# Patient Record
Sex: Female | Born: 1935
Health system: Southern US, Community
[De-identification: ages and names within clinical notes are randomized; demographics above are authoritative.]

## PROBLEM LIST (undated history)

## (undated) DIAGNOSIS — G4700 Insomnia, unspecified: Secondary | ICD-10-CM

## (undated) DIAGNOSIS — F329 Major depressive disorder, single episode, unspecified: Secondary | ICD-10-CM

## (undated) DIAGNOSIS — F0390 Unspecified dementia without behavioral disturbance: Secondary | ICD-10-CM

## (undated) DIAGNOSIS — D696 Thrombocytopenia, unspecified: Secondary | ICD-10-CM

## (undated) DIAGNOSIS — N183 Chronic kidney disease, stage 3 unspecified: Secondary | ICD-10-CM

## (undated) DIAGNOSIS — I447 Left bundle-branch block, unspecified: Secondary | ICD-10-CM

## (undated) DIAGNOSIS — M1990 Unspecified osteoarthritis, unspecified site: Secondary | ICD-10-CM

## (undated) DIAGNOSIS — S72141A Displaced intertrochanteric fracture of right femur, initial encounter for closed fracture: Secondary | ICD-10-CM

## (undated) DIAGNOSIS — F32A Depression, unspecified: Secondary | ICD-10-CM

## (undated) DIAGNOSIS — I10 Essential (primary) hypertension: Secondary | ICD-10-CM

## (undated) HISTORY — DX: Unspecified dementia, unspecified severity, without behavioral disturbance, psychotic disturbance, mood disturbance, and anxiety: F03.90

## (undated) HISTORY — DX: Insomnia, unspecified: G47.00

## (undated) HISTORY — DX: Unspecified osteoarthritis, unspecified site: M19.90

## (undated) HISTORY — DX: Depression, unspecified: F32.A

## (undated) HISTORY — DX: Chronic kidney disease, stage 3 (moderate): N18.3

## (undated) HISTORY — DX: Chronic kidney disease, stage 3 unspecified: N18.30

## (undated) HISTORY — DX: Left bundle-branch block, unspecified: I44.7

## (undated) HISTORY — DX: Major depressive disorder, single episode, unspecified: F32.9

## (undated) HISTORY — DX: Essential (primary) hypertension: I10

---

## 2002-06-18 ENCOUNTER — Other Ambulatory Visit: Admission: RE | Admit: 2002-06-18 | Discharge: 2002-06-18 | Payer: Self-pay | Admitting: *Deleted

## 2002-06-21 ENCOUNTER — Encounter (INDEPENDENT_AMBULATORY_CARE_PROVIDER_SITE_OTHER): Payer: Self-pay | Admitting: Cardiology

## 2002-06-21 ENCOUNTER — Ambulatory Visit (HOSPITAL_COMMUNITY): Admission: RE | Admit: 2002-06-21 | Discharge: 2002-06-21 | Payer: Self-pay | Admitting: Cardiology

## 2002-07-20 ENCOUNTER — Ambulatory Visit (HOSPITAL_COMMUNITY): Admission: RE | Admit: 2002-07-20 | Discharge: 2002-07-20 | Payer: Self-pay | Admitting: *Deleted

## 2002-07-20 ENCOUNTER — Encounter (INDEPENDENT_AMBULATORY_CARE_PROVIDER_SITE_OTHER): Payer: Self-pay | Admitting: Specialist

## 2002-08-16 HISTORY — PX: VAGINAL HYSTERECTOMY: SHX2639

## 2002-08-16 HISTORY — PX: RECTOCELE REPAIR: SHX761

## 2002-09-06 ENCOUNTER — Encounter (INDEPENDENT_AMBULATORY_CARE_PROVIDER_SITE_OTHER): Payer: Self-pay | Admitting: Specialist

## 2002-09-06 ENCOUNTER — Inpatient Hospital Stay (HOSPITAL_COMMUNITY): Admission: RE | Admit: 2002-09-06 | Discharge: 2002-09-08 | Payer: Self-pay | Admitting: *Deleted

## 2006-07-15 ENCOUNTER — Inpatient Hospital Stay (HOSPITAL_COMMUNITY): Admission: EM | Admit: 2006-07-15 | Discharge: 2006-07-18 | Payer: Self-pay | Admitting: Emergency Medicine

## 2006-07-17 DIAGNOSIS — I447 Left bundle-branch block, unspecified: Secondary | ICD-10-CM

## 2006-07-17 HISTORY — PX: CARDIAC CATHETERIZATION: SHX172

## 2006-07-17 HISTORY — DX: Left bundle-branch block, unspecified: I44.7

## 2006-07-17 HISTORY — PX: CARDIOVASCULAR STRESS TEST: SHX262

## 2009-10-05 ENCOUNTER — Emergency Department (HOSPITAL_COMMUNITY): Admission: EM | Admit: 2009-10-05 | Discharge: 2009-10-05 | Payer: Self-pay | Admitting: Emergency Medicine
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
## 2010-07-03 NOTE — Discharge Summary (Signed)
   Laura Savage, Laura Savage                            ACCOUNT NO.:  [id number]   MEDICAL RECORD NO.:  [id number]                   PATIENT TYPE:  INP   LOCATION:  0446                                 FACILITY:  [facility]   PHYSICIAN:  Pershing Cox, M.D.            DATE OF BIRTH:  02/03/1930   DATE OF ADMISSION:  09/06/2002  DATE OF DISCHARGE:  09/07/2002                                 DISCHARGE SUMMARY   ADMISSION DIAGNOSES:  1. Genital prolapse.  2. Stress urinary incontinence.   HOSPITAL COURSE:  For record of this patient's admission history and  physical, please see the note dated September 06, 2002.  Briefly, she is 77 years  old.  She had significant vaginal prolapse with a third degree cystocele and  cervix extending to her introitus.  She was seen by Lynelle Smoke I. Patsi Sears,  M.D. prior to surgery and because of her stress incontinence a decision was  made to perform a sling in conjunction with her scheduled surgery for  genital prolapse.   On the day of admission the patient was taken to the operating room and  under general anesthesia vaginal hysterectomy and rectocele repair,  perineorrhaphy were performed without complications.  She was also operated  on by co-surgeon Sigmund I. Patsi Sears, M.D. who placed an obturator sling.   On the evening of the day of surgery the patient had some nausea but it was  well controlled.  She had no problems with pain.  Vaginal packing was  removed.  On the morning of postoperative day #1 she received Toradol.  Foley catheter was removed.  Hemoglobin was stable at 10.2.  Later in the  day after being seen by urology she was able to be discharged with plans to  be seen in my office and by Sigmund I. Patsi Sears, M.D. regarding her sling  procedure.                                               Pershing Cox, M.D.    MAJ/MEDQ  D:  09/24/2002  T:  09/24/2002  Job:  [id number]   cc:   Lynelle Smoke I. Patsi Sears, M.D.  [address]  [place]  [place] [address]  Fax: [phone]   Tammy R. Collins Scotland, M.D.  [address]  [place]  [place] [address]  Fax: [phone]

## 2010-07-03 NOTE — Op Note (Signed)
NAMEJAQUAYA, COYLE                            ACCOUNT NO.:  [id number]   MEDICAL RECORD NO.:  [id number]                   PATIENT TYPE:  INP   LOCATION:  0002                                 FACILITY:  [facility]   PHYSICIAN:  Pershing Cox, M.D.            DATE OF BIRTH:  1930/02/11   DATE OF PROCEDURE:  09/06/2002  DATE OF DISCHARGE:                                 OPERATIVE REPORT   PREOPERATIVE DIAGNOSIS:  Genital prolapse.   POSTOPERATIVE DIAGNOSIS:  Genital prolapse.   ANESTHESIA:  General endotracheal.   SURGEON:  Pershing Cox, M.D.   Mammie LorenzoLynelle Smoke I. Patsi Sears, M.D.   ASSISTANT SURGEON:  Genia Del, M.D.   INDICATIONS FOR PROCEDURE:  The patient is 77 years old.  She has a complete  genital prolapse and presented with postmenopausal bleeding.  She  subsequently had a D&C hysteroscopy showing a single polyp with simple  hyperplasia.  She had a very large residual urine at the time of that  procedure and was seen by Sigmund I. Patsi Sears, M.D. prior to surgery, who  is co-surgeon for this case to place a vaginal sling and cystocele repair.  The patient is admitted today for a vaginal hysterectomy, anterior and  posterior repair with the urethral sling.   FINDINGS:  The patient's cervix was at her introitus and at rest and past  her introitus with traction.  There was a third-degree cystocele, first-  degree rectocele.  Both ovaries were atrophic.   DESCRIPTION OF PROCEDURE:  Jillianna Stanek was brought to the operating room with  an IV in place.  She had received 1 g of Ancef in the holding area, and  thigh-high PAS stockings were placed on her extremities.  Supine on the OR  table, general endotracheal anesthesia was induced without difficulty.  She  was then placed into Allen stirrups, and the anterior abdominal wall,  perineum, vagina and upper thighs were prepped with a solution of Hibiclens.  She was then draped for a sterile vaginal procedure.   Foley catheter was  sterilely inserted into the bladder, and a specimen was taken for  urinalysis.  A drape was placed across the anus, and a weighted vaginal  speculum was introduced into the vagina.   Christella Hartigan tenaculums were placed on the anterior and posterior cervix for  orientation.  Marcaine 0.5% with epinephrine was injected into the  pericervical tissues which were then cut with cutting cautery using the  Bovie.  The anterior vaginal wall was also infiltrated with the solution of  0.5% Marcaine with epinephrine.  Mayo scissors were used to separate the  vaginal mucosa from the _____ to cervix.  At the completion of the  procedure, we ascertained that the cervix was approximately 10 inches long.  The uterus itself was about eight weeks in size.  The majority of the case  was spent trying to enter the anterior  and posterior cul-de-sac.  Because of  the extremely elongated cervix, it was very difficult.  I was able to begin  the case by isolating the uterosacral ligament.  Once the uterus was  completely visualized and the posterior cul-de-sac was opened, I was able to  then take the uterosacral ligaments at their deep insertion which was high  in the cul-de-sac.   Entering the cul-de-sac of Douglas, the uterosacral ligaments were  visualized, clamped, cut, sutured with 0 Vicryls and held for later in the  case.  The posterior peritoneum was identified and sutured to the posterior  cuff over the top of the irregular tissue which had been abraded during our  dissection getting into the cul-de-sac of Douglas.  We were able to enter  the anterior cul-de-sac without difficulty and using the LigaSure, the  anterior and posterior peritoneums were opposed going up the sidewall.  The  fundus of the uterus was everted and inadvertently severed from its  attachment to the utero-ovarian ligament.  I was able to secure the uterus  on the patient's right, removing the uterus completely.  The  ovary on this  side was visualized and was small.  There was a bleeding pedicle coming from  the left ovary.  The ovary was lifted, and the pedicle was easily seen,  clamped, and suture ligated.  Using the uterosacral ligaments as a guide,  McCall sutures were used to obliterate the cul-de-sac to try to prevent  enterocele development.  These were affixed to the uterosacral ligament.  The vagina was then closed side-to-side using interrupted 0 Vicryl sutures.  Uterosacral ligaments were secured to one another underneath this closure.   At this point, Sigmund I. Patsi Sears, M.D. came into the operating room to  complete his procedure which he will describe in a separate operative note.   At the end of the procedure, I performed a perineorrhaphy.  Marcaine with  lidocaine was injected into the perineum and along the posterior vaginal  wall.  Rectocele repair was performed by incising the vaginal mucosa.  Using  T-clamps, we separated from the underlying rectosigmoid.  Vicryl mesh was  folded in fourths and placed over the rectosigmoid which had been dissected  successfully from the vaginal mucosa.  This was secured over the top of the  rectal tissue, and perivaginal tissues were closed over the fascial implant.  The vaginal walls were excised, and then the vaginal mucosa was closed over  the underlying layered tissue.  The 0 Vicryl suture was used to bring the  perineum together using three interrupted sutures.  The skin was then  brought together using a running 2-0 Vicryl stitch with a subcuticular  closing the skin.  Packing was placed into the vagina with Estrace cream.   ESTIMATED BLOOD LOSS:  400 mL.   URINE OUTPUT:  650 mL.   FLUIDS:  3200 mL.   SPECIMENS:  Uterus morcellated.                                               Pershing Cox, M.D.    MAJ/MEDQ  D:  09/06/2002  T:  09/06/2002  Job:  [id number]  cc:   Lynelle Smoke I. Patsi Sears, M.D.  [address]   [place]  [place] [address]  Fax: [phone]   Genia Del, M.D.  [address]  [place]  [place] [address]  Fax: [phone]   Tammy R. Collins Scotland, M.D.  [address]  [place]  [place] [address]  Fax: [phone]

## 2010-07-03 NOTE — Op Note (Signed)
NAMEGUY, TONEY                            ACCOUNT NO.:  [id number]   MEDICAL RECORD NO.:  [id number]                   PATIENT TYPE:  INP   LOCATION:  0002                                 FACILITY:  [facility]   PHYSICIAN:  Sigmund I. Patsi Sears, M.D.         DATE OF BIRTH:  April 10, 1929   DATE OF PROCEDURE:  09/06/2002  DATE OF DISCHARGE:                                 OPERATIVE REPORT   PREOPERATIVE DIAGNOSIS:  Pelvic prolapse with stress urinary incontinence.   POSTOPERATIVE DIAGNOSIS:  Pelvic prolapse with stress urinary incontinence.   OPERATION:  Mentor OB sling with Tutoplast reinforcement, anterior vaginal  vault repair with Vicryl mesh and tutoplasty reinforcement.   SURGEON:  Sigmund I. Patsi Sears, M.D. and Pershing Cox, M.D.   PREPARATION:  After appropriate preanesthesia, the patient was brought to  the operating room, placed on the operating table in dorsal supine position  where general endotracheal anesthesia was introduced. She then was placed in  the dorsal lithotomy position, and underwent vaginal hysterectomy per Dr.  Carey Bullocks.   Following hysterectomy, the Foley was exchanged, and the pelvic prolapse was  evaluated. The patient has a very large cystourethrocele with a grade 4  prolapse. Xylocaine with epinephrine 1:200,000 was injected and a 2 cm  incision was made over the urethra and subcutaneous tissue, dissected  bilaterally. The obturator fossa was marked 5 cm from the clitoris and two  stab wounds were made. The Mentor obturator sling was then placed in  retrograde fashion, with care taken to avoid any tightness at the midline.  Cystoscopy revealed no evidence of sling within the bladder. The bladder was  drained of fluid, and the ends of the tape were cut subcutaneously. A 2 cm  portion of Tutoplast fascia was then placed over the sling material and the  wound closed in two layers with interrupted and running 3-0 Vicryl suture.   A midline  incision was then made approximately 3-4 mm away from the urethral  incision, more proximalward, subcutaneous tissue dissected bilaterally. The  large cystocele was reduced, and a 4 cm x 4 cm square of Vicryl mesh was  used to hold the bladder in place. Anterior vaginal vault repair was then  accomplished over the top of that, and Tutoplast fascia was then used to  reinforce the repair. The devascularized edge of vaginal vault is removed,  and the wound is closed with running 3-0 Vicryl suture. The patient then  underwent rectocele repair per Dr. Carey Bullocks and this will be dictated  separately.                                               Sigmund I. Patsi Sears, M.D.   SIT/MEDQ  D:  09/06/2002  T:  09/06/2002  Job:  [id number]  cc:   Pershing Cox, M.D.  [address]  [place]  [place] [address]  Fax: [phone]

## 2010-07-03 NOTE — Op Note (Signed)
NAMEJENESIS, Laura Savage                            ACCOUNT NO.:  [id number]   MEDICAL RECORD NO.:  [id number]                   PATIENT TYPE:  AMB   LOCATION:  DAY                                  FACILITY:  [facility]   PHYSICIAN:  Pershing Cox, M.D.            DATE OF BIRTH:  02-Nov-1929   DATE OF PROCEDURE:  07/20/2002  DATE OF DISCHARGE:                                 OPERATIVE REPORT   PREOPERATIVE DIAGNOSES:  1. Postmenopausal bleeding.  2. Endometrial biopsy showing evidence of endometrial polyp.  3. Third-degree cystocele.  4. Genital prolapse.   POSTOPERATIVE DIAGNOSES:  1. Endometrial polyps.  2. Third-degree cystocele.  3. Genital prolapse.   PROCEDURE:  1. Exam under anesthesia.  2. Fractional D&C hysteroscopy.  3. Resection of endometrial polyps.  4. In and out catheterization for urine C&S.   ANESTHESIA:  MAC plus Marcaine paracervical block.   SURGEON:  Pershing Cox, M.D.   INDICATIONS FOR PROCEDURE:  Laura Savage is 77 years old.  She has a recent  diagnosis of postmenopausal bleeding and presented because of a large bulge  in her vagina.  Endometrial biopsy was performed which showed benign simple  hyperplasia and a polyp fragment.  The patient is brought to the operating  room today to rule out endometrial carcinoma in preparation for a planned  vaginal hysterectomy and anterior repair.   OPERATIVE FINDINGS:  There was approximately 500 mL of fluid in the  patient's bladder which was clear, suggesting some sort of relative  obstruction or urinary retention.  The patient's endometrial cavity sounded  to 11 cm, suggesting cervical elongation in the face of a normal uterine  fundus.  There were polyps arising from the upper fundus.  Otherwise the  endometrial cavity was atrophic.   DESCRIPTION OF PROCEDURE:  Laura Savage was brought to the operating room with  an IV in place.  She was placed supine on the OR table, and IV sedation was  administered.   She was then placed into Allen stirrups.  The urethra was  prepped with Hibiclens, and an in and out catheterization was performed,  gathering urine for UA and C&S.  There was approximately 500 mL of clear  urine drained.  She had only received 100 mL of IV fluid at this time.   Using Hibiclens, the anterior abdominal wall, perineum, vagina, and upper  thighs were cleansed.  The patient was then draped for a sterile vaginal  procedure.  It was unnecessary to use a speculum for this procedure because  there was such a high degree of prolapse.  Tenaculum was placed onto the  anterior cervix, then a Marcaine paracervical block was administered at the  3, 4, 7, and 8 positions using total volume of 0.25% Marcaine 15 mL.  Kevorkian curette was used to obtain endocervical curettings.  Uterine sound  then passed to 11 cm.  Serial Shawnie Pons  dilators were used to dilate the cervix  to admit a diagnostic scope.  The scope was introduced, and the polyps were  visualized, and a decision was made to proceed with resection using a right-  angle wire.  The cervix was then further dilated to admit a 33 dilator, and  the resectoscope was introduced.  Using through-and-through sorbitol  irrigation, the cavity was visualized.  Using the right-angle wire, the  polyps were removed from the uterine fundus in two passes.  These were  gathered onto a specimen container.  Next, endometrial curettings were  obtained using a small sharp curette, using a circumferential curettage.  These fragments were very scant.   Specimens include endocervical curettings, endometrial polyps, and  endometrial curettings.  The sound passed again to a depth of 11 cm.   There were no intraoperative complications.  The patient tolerated the  procedure well and was taken to the recovery room in excellent condition.                                               Pershing Cox, M.D.    MAJ/MEDQ  D:  07/20/2002  T:  07/20/2002  Job:   [id number]   cc:   Tammy R. Collins Scotland, M.D.  [address]  [place]  [place] [address]  Fax: [phone]

## 2010-09-25 ENCOUNTER — Other Ambulatory Visit: Payer: Self-pay | Admitting: Neurology

## 2010-09-25 DIAGNOSIS — R269 Unspecified abnormalities of gait and mobility: Secondary | ICD-10-CM

## 2010-09-25 DIAGNOSIS — E785 Hyperlipidemia, unspecified: Secondary | ICD-10-CM

## 2010-09-25 DIAGNOSIS — F068 Other specified mental disorders due to known physiological condition: Secondary | ICD-10-CM

## 2010-09-25 DIAGNOSIS — I10 Essential (primary) hypertension: Secondary | ICD-10-CM

## 2010-09-29 ENCOUNTER — Ambulatory Visit
Admission: RE | Admit: 2010-09-29 | Discharge: 2010-09-29 | Disposition: A | Discharge status: Home / Self Care | Payer: Self-pay | Source: Ambulatory Visit | Attending: Neurology | Admitting: Neurology

## 2010-09-29 DIAGNOSIS — I10 Essential (primary) hypertension: Secondary | ICD-10-CM

## 2010-09-29 DIAGNOSIS — R269 Unspecified abnormalities of gait and mobility: Secondary | ICD-10-CM

## 2010-09-29 DIAGNOSIS — E785 Hyperlipidemia, unspecified: Secondary | ICD-10-CM

## 2010-09-29 DIAGNOSIS — F068 Other specified mental disorders due to known physiological condition: Secondary | ICD-10-CM

## 2010-12-03 LAB — BASIC METABOLIC PANEL
BUN: 21
Calcium: 9.4
Creatinine, Ser: 1.13
GFR calc non Af Amer: 47 — ABNORMAL LOW

## 2011-12-10 ENCOUNTER — Other Ambulatory Visit: Payer: Self-pay | Admitting: Nephrology

## 2011-12-15 ENCOUNTER — Other Ambulatory Visit: Payer: Medicare HMO

## 2011-12-22 ENCOUNTER — Ambulatory Visit
Admission: RE | Admit: 2011-12-22 | Discharge: 2011-12-22 | Disposition: A | Discharge status: Home / Self Care | Payer: Medicare HMO | Source: Ambulatory Visit | Attending: Nephrology | Admitting: Nephrology

## 2012-07-06 ENCOUNTER — Encounter: Payer: Self-pay | Admitting: Family Medicine

## 2012-07-06 ENCOUNTER — Ambulatory Visit (INDEPENDENT_AMBULATORY_CARE_PROVIDER_SITE_OTHER): Payer: Medicare HMO | Admitting: Family Medicine

## 2012-07-06 VITALS — BP 121/79 | HR 68 | Temp 98.7°F | Resp 16 | Ht 63.0 in | Wt 170.5 lb

## 2012-07-06 DIAGNOSIS — F981 Encopresis not due to a substance or known physiological condition: Secondary | ICD-10-CM

## 2012-07-06 DIAGNOSIS — K219 Gastro-esophageal reflux disease without esophagitis: Secondary | ICD-10-CM

## 2012-07-06 DIAGNOSIS — R1310 Dysphagia, unspecified: Secondary | ICD-10-CM | POA: Insufficient documentation

## 2012-07-06 DIAGNOSIS — R159 Full incontinence of feces: Secondary | ICD-10-CM

## 2012-07-06 MED ORDER — OMEPRAZOLE 40 MG PO CPDR: 40.0000 mg | DELAYED_RELEASE_CAPSULE | Freq: Every day | ORAL | Status: DC

## 2012-07-06 NOTE — Assessment & Plan Note (Signed)
Recommended stopping all "gas pills". Eat smaller meals. I think most of her problem is her inability to physically get to the bathroom in order to use the toilet (more than actual severe fecal urgency).

## 2012-07-06 NOTE — Progress Notes (Signed)
OFFICE NOTE  07/06/2012  CC:  Chief Complaint  Patient presents with  . Bowel Incontinence    Pt c/o bowel incontinence episodes.     HPI: Patient is a 79 y.o. Caucasian female who is a new pt today here for an acute visit with her husband and her daughter in law. She formerly saw Dr. Yehuda Budd in [place]. Her complaint today is fecal incontinence.  Unsure how long it has been going on (pt does have dementia and has only been with the daughter in law as caregiver in the last 1 wk), but pt estimates 59mo. Has had about 10 total episodes of losing some or all stool.  Says she will get a feeling of urgency to have a BM and then can't make it to the bathroom in time.  Onset was not coincidental with starting any particular med. Normal stool pattern is soft bm once daily.   Most of the cases of fecal incont have occurred shortly after eating a very large meal.  She denies any specific type of food being a problem particularly.  Takes lots of "gas pills" for epigastric fullness/burning discomfort. She does have some burning in central, upper sternal area, usually after eating.  Has recent history of persistent feeling of difficulty swallowing food and pills.  Unclear how long this part has been occuring.    Pertinent PMH:  Past Medical History  Diagnosis Date  . Hypertension   . Dementia   . Depression   . Osteoarthritis   . Chronic renal insufficiency, stage III (moderate)     2013 renal u/s: medical renal dz  . Insomnia   . LBBB (left bundle branch block) 07/2006   PSH: Hysterectomy  SOC: Lives with husband and daughter in law.  No T/A/Ds.   MEDS:  NONE  PE: Blood pressure 121/79, pulse 68, temperature 98.7 F (37.1 C), temperature source Oral, resp. rate 16, height 5\' 3"  (1.6 m), weight 170 lb 8 oz (77.338 kg), SpO2 94.00%. Gen: Alert, well appearing.  Patient is oriented to person, place, time, and situation.  Moves very slowly and weakly climbs onto exam table with slight  assistance in balance. ENT: Eyes: no injection, icteris, swelling, or exudate.  EOMI, PERRLA. Nose: no drainage or turbinate edema/swelling.  No injection or focal lesion.  Mouth: lips without lesion/swelling.  Oral mucosa pink and moist.  Oropharynx without erythema, exudate, or swelling.  CV: RRR, no m/r/g.   LUNGS: CTA bilat, nonlabored resps, good aeration in all lung fields. ABD: soft, nondistended.  Mild diffuse TTP w/out guarding or rebound.  No mass or HSM or bruit.   IMPRESSION AND PLAN:  New pt; limited/acute visit.  Fecal incontinence not due to organic disease Recommended stopping all "gas pills". Eat smaller meals. I think most of her problem is her inability to physically get to the bathroom in order to use the toilet (more than actual severe fecal urgency).  Dysphagia Suspect GERD/esophageal stenosis-related. Anticipate referral to GI in the near future but not ordered today---will see how she does on PPI qd and bring her back for full visit to gather more information and will obtain old records from previous PCP in the meantime.  GERD (gastroesophageal reflux disease) Dyspepsia as well. Start prilosec 40mg  qAM. Discussed GERD diet and some change in habits to avoid excessive GER (she often eats a large meal and then immediately goes to lie down and go to bed).   An After Visit Summary was printed and given to the  patient.  FOLLOW UP: 3 wks for her official "establish care" visit and to f/u the problems above.

## 2012-07-06 NOTE — Assessment & Plan Note (Signed)
Dyspepsia as well. Start prilosec 40mg  qAM. Discussed GERD diet and some change in habits to avoid excessive GER (she often eats a large meal and then immediately goes to lie down and go to bed).

## 2012-07-06 NOTE — Assessment & Plan Note (Signed)
Suspect GERD/esophageal stenosis-related. Anticipate referral to GI in the near future but not ordered today---will see how she does on PPI qd and bring her back for full visit to gather more information and will obtain old records from previous PCP in the meantime.

## 2012-07-06 NOTE — Patient Instructions (Signed)
Eat smaller meals.  Do not eat within 2 hours of lying down to go to sleep. Review your diet for heartburn and reflux (handout). Stop all of your "gas pills".

## 2012-07-27 ENCOUNTER — Encounter: Payer: Self-pay | Admitting: Family Medicine

## 2012-07-27 ENCOUNTER — Ambulatory Visit (INDEPENDENT_AMBULATORY_CARE_PROVIDER_SITE_OTHER): Payer: Medicare HMO | Admitting: Family Medicine

## 2012-07-27 VITALS — BP 130/78 | HR 67 | Temp 97.4°F | Resp 14 | Wt 180.2 lb

## 2012-07-27 DIAGNOSIS — R2689 Other abnormalities of gait and mobility: Secondary | ICD-10-CM

## 2012-07-27 DIAGNOSIS — M545 Low back pain, unspecified: Secondary | ICD-10-CM

## 2012-07-27 DIAGNOSIS — Z9181 History of falling: Secondary | ICD-10-CM

## 2012-07-27 DIAGNOSIS — R296 Repeated falls: Secondary | ICD-10-CM

## 2012-07-27 DIAGNOSIS — N189 Chronic kidney disease, unspecified: Secondary | ICD-10-CM

## 2012-07-27 DIAGNOSIS — M25569 Pain in unspecified knee: Secondary | ICD-10-CM

## 2012-07-27 DIAGNOSIS — R29818 Other symptoms and signs involving the nervous system: Secondary | ICD-10-CM

## 2012-07-27 DIAGNOSIS — I10 Essential (primary) hypertension: Secondary | ICD-10-CM

## 2012-07-27 LAB — BASIC METABOLIC PANEL
Calcium: 10.5 mg/dL (ref 8.4–10.5)
GFR: 34.36 mL/min — ABNORMAL LOW (ref >60.00)
Sodium: 140 mEq/L (ref 135–145)

## 2012-07-27 NOTE — Progress Notes (Signed)
OFFICE NOTE  07/27/2012  CC:  Chief Complaint  Patient presents with  . Follow-up    3-wk [Fecal Incontinence; GERD/Dysphagia]     HPI: Patient is a 79 y.o. Caucasian female who is here with her daughter in law Merle for 3 wk f/u for fecal incontinence, GERD, dysphagia.   Says GERD and dysphagia are about the same on daily PPI.  Seems to be doing well, though, eating Merle's home cooking and has gained 10 lbs but denies overeating. Discussed memory problems most of today's visit--short term memory issues started around 2002-- getting worse gradually.  She is unsure how long she has been on both aricept and namenda, and we have no old records yet.  Since 01/2012 Christeen Douglas says she has gotten even worse.  Her husband is her HC POA.  She does not have a living will.  Merle says the pt's family has "turned on her" recently regarding some of her assets, etc--pt was very upset by this.   She has been seen by [facility] in the past, mri done--need records.  No change in her hx of fecal incontinence episodes: occurs only occasionally.  Compliant with all meds.  No home bp monitoring is done.  Pertinent PMH:  Past Medical History  Diagnosis Date  . Hypertension   . Dementia   . Depression   . Osteoarthritis   . Chronic renal insufficiency, stage III (moderate)     2013 renal u/s: medical renal dz  . Insomnia   . LBBB (left bundle branch block) 07/2006    MEDS:  Outpatient Prescriptions Prior to Visit  Medication Sig Dispense Refill  . ALPRAZolam (XANAX) 1 MG tablet Take 1 mg by mouth at bedtime as needed for sleep.      Marland Kitchen aspirin 81 MG tablet Take 81 mg by mouth daily.      Marland Kitchen donepezil (ARICEPT) 10 MG tablet Take 10 mg by mouth daily. Memory.      Marland Kitchen losartan-hydrochlorothiazide (HYZAAR) 50-12.5 MG per tablet Take 1 tablet by mouth daily.      . memantine (NAMENDA) 5 MG tablet Take 5 mg by mouth 2 (two) times daily.      . Multiple Vitamins-Minerals (MULTIVITAMIN & MINERAL PO)  Take 1 tablet by mouth daily. [facility].      . Omega-3 Fatty Acids (OMEGA-3 FISH OIL) 1200 MG CAPS Take 1 each by mouth daily. Nature Made + 360 mg OMEGA 3      . omeprazole (PRILOSEC) 40 MG capsule Take 1 capsule (40 mg total) by mouth daily.  30 capsule  3  . sertraline (ZOLOFT) 100 MG tablet Take 100 mg by mouth daily.      . vitamin B-12 (CYANOCOBALAMIN) 1000 MCG tablet Take 1,000 mcg by mouth daily. Nature Made.      . meloxicam (MOBIC) 7.5 MG tablet Take 7.5 mg by mouth 2 (two) times daily as needed for pain.       No facility-administered medications prior to visit.  Not taking mobic as listed above  PE: Blood pressure 130/78, pulse 67, temperature 97.4 F (36.3 C), temperature source Oral, resp. rate 14, weight 180 lb 4 oz (81.761 kg), SpO2 96.00%. GEN: alert, oriented to person and place and situation. AFFECT: pleasant, frequently laughed at appropriate times. CV: RRR, no m/r/g.   LUNGS: CTA bilat, nonlabored resps, good aeration in all lung fields. MMSE today: 15/30   IMPRESSION AND PLAN:  1) HTN, stable.  OK to continue ASA 81mg  qd. Check  lytes/cr.  2) CRI, stage III.  BMET today.    3) Dementia, suspect alzheimer's type: progressing as expected despite use of aricept and namenda.  We'll d/c namenda today and after one week we'll have her d/c aricept. We'll see if there is any change in her MS/cognitive function with d/c of these meds. Discussed things with her husband today to try to get him to fully understand what is going on with her and what to expect over time.    4) Generalized back and knees stiffness, mild arthralgias in these regions--some balance issues and frequent falls. Will arrange for PT for strengthening and help with balance.  Will put in another request for old records with former PMD, also will request records from [facility].  FOLLOW UP: 1 mo

## 2012-07-27 NOTE — Patient Instructions (Signed)
Stop Namenda today. In one week you may stop aricept.

## 2012-08-08 ENCOUNTER — Other Ambulatory Visit: Payer: Self-pay | Admitting: Family Medicine

## 2012-08-08 MED ORDER — OMEPRAZOLE 40 MG PO CPDR: 40.0000 mg | DELAYED_RELEASE_CAPSULE | Freq: Every day | ORAL | Status: AC

## 2012-08-08 MED ORDER — SERTRALINE HCL 100 MG PO TABS: 100.0000 mg | ORAL_TABLET | Freq: Every day | ORAL | Status: DC

## 2012-08-08 NOTE — Telephone Encounter (Signed)
[facility]

## 2012-09-04 ENCOUNTER — Encounter: Payer: Self-pay | Admitting: Family Medicine

## 2012-09-04 ENCOUNTER — Ambulatory Visit (INDEPENDENT_AMBULATORY_CARE_PROVIDER_SITE_OTHER): Payer: Medicare HMO | Admitting: Family Medicine

## 2012-09-04 VITALS — BP 151/71 | HR 71 | Temp 98.6°F | Resp 18 | Ht 63.0 in | Wt 173.0 lb

## 2012-09-04 DIAGNOSIS — F0390 Unspecified dementia without behavioral disturbance: Secondary | ICD-10-CM

## 2012-09-04 LAB — BASIC METABOLIC PANEL
Chloride: 102 mEq/L (ref 96–112)
Creatinine, Ser: 1.6 mg/dL — ABNORMAL HIGH (ref 0.4–1.2)
Potassium: 3.2 mEq/L — ABNORMAL LOW (ref 3.5–5.1)
Sodium: 138 mEq/L (ref 135–145)

## 2012-09-04 NOTE — Progress Notes (Signed)
OFFICE NOTE  09/04/2012  CC:  Chief Complaint  Patient presents with  . Follow-up     HPI: Patient is a 79 y.o. Caucasian female who is here for 6 wk f/u dementia.  Daugher in law with her today, says she is getting worse regarding her dementia.  Her husband fell recently and was on floor for several hours and pt was apparently walking around the house unaware of anything wrong.  No combative behavior.   Daughter asking for help---HH nursing?  Pt and her husband refuse to go to ALF or NH.  Pertinent PMH:  Past Medical History  Diagnosis Date  . Hypertension   . Dementia   . Depression   . Osteoarthritis   . Chronic renal insufficiency, stage III (moderate)     2013 renal u/s: medical renal dz  . Insomnia   . LBBB (left bundle branch block) 07/2006   Past surgical, social, and family history reviewed and no changes noted since last office visit.  MEDS:  Outpatient Prescriptions Prior to Visit  Medication Sig Dispense Refill  . ALPRAZolam (XANAX) 1 MG tablet Take 1 mg by mouth at bedtime as needed for sleep.      Marland Kitchen aspirin 81 MG tablet Take 81 mg by mouth daily.      Marland Kitchen losartan-hydrochlorothiazide (HYZAAR) 50-12.5 MG per tablet Take 1 tablet by mouth daily.      . meloxicam (MOBIC) 7.5 MG tablet Take 7.5 mg by mouth 2 (two) times daily as needed for pain.      . Multiple Vitamins-Minerals (MULTIVITAMIN & MINERAL PO) Take 1 tablet by mouth daily. [facility].      . Omega-3 Fatty Acids (OMEGA-3 FISH OIL) 1200 MG CAPS Take 1 each by mouth daily. Nature Made + 360 mg OMEGA 3      . omeprazole (PRILOSEC) 40 MG capsule Take 1 capsule (40 mg total) by mouth daily.  90 capsule  1  . sertraline (ZOLOFT) 100 MG tablet Take 1 tablet (100 mg total) by mouth daily.  90 tablet  0  . vitamin B-12 (CYANOCOBALAMIN) 1000 MCG tablet Take 1,000 mcg by mouth daily. Nature Made.       No facility-administered medications prior to visit.    PE: Blood pressure 151/71, pulse 71, temperature  98.6 F (37 C), temperature source Temporal, resp. rate 18, height 5\' 3"  (1.6 m), weight 173 lb (78.472 kg), SpO2 97.00%. Gen: alert, NAD.  Oriented to person, smiles and converses and makes good eye contact.  Turns to daughter, Christeen Douglas, and looks for her to answer every time she is asked a question.  Giggles frequently. CV: RRR, no m/r/g.   LUNGS: CTA bilat, nonlabored resps, good aeration in all lung fields.   IMPRESSION AND PLAN:  Dementia This is impairing her ability to take care of herself. I recommended she be cared for in an ALF or NH environment since her husband is unable to adequately care for/supervise her, and her daughter has reached the limit of stress level in trying to care for her. Pt's husband will not allow this currently, however, and we settled for a HH nursing referral/evaluation---ordered today.   Chronic renal insufficiency, stage III (moderate) Recheck BMET to check stability of cr, also K.   HTN: stable.  Will err a bit on the high side of normal at this time.  FOLLOW UP: 1 mo f/u dementia and HTN

## 2012-09-05 ENCOUNTER — Telehealth: Payer: Self-pay | Admitting: Family Medicine

## 2012-09-05 MED ORDER — POTASSIUM CHLORIDE CRYS ER 20 MEQ PO TBCR: 20.0000 meq | EXTENDED_RELEASE_TABLET | Freq: Every day | ORAL | Status: DC

## 2012-09-05 NOTE — Telephone Encounter (Signed)
Laura Savage wondering how Laura Savage did when you asked her questions yesterday at her visit.  She was just wondering how bad her dementia is in your eyes.  Please advise.

## 2012-09-05 NOTE — Telephone Encounter (Signed)
I'd say her dementia is at least moderate in severity.-thx

## 2012-09-06 NOTE — Telephone Encounter (Signed)
Patients care giver said that Patient wandered last night and the fire dept found her down the street.  [name] wants to know what her options are for help.  Please advise.

## 2012-09-06 NOTE — Telephone Encounter (Signed)
Left detailed message for Laura Savage letting her know provider advice.

## 2012-09-06 NOTE — Telephone Encounter (Signed)
Unfortunately, her options are the same as yesterday----we have ordered a home health referral for evaluation but ultimately the best solution is a nursing home or assisted living facility for both of them.-thx

## 2012-09-07 ENCOUNTER — Encounter: Payer: Self-pay | Admitting: Family Medicine

## 2012-09-07 ENCOUNTER — Ambulatory Visit (HOSPITAL_BASED_OUTPATIENT_CLINIC_OR_DEPARTMENT_OTHER)
Admission: RE | Admit: 2012-09-07 | Discharge: 2012-09-07 | Disposition: A | Discharge status: Home / Self Care | Payer: Medicare HMO | Source: Ambulatory Visit | Attending: Family Medicine | Admitting: Family Medicine

## 2012-09-07 ENCOUNTER — Ambulatory Visit (INDEPENDENT_AMBULATORY_CARE_PROVIDER_SITE_OTHER): Payer: Medicare HMO | Admitting: Family Medicine

## 2012-09-07 VITALS — BP 153/83 | HR 78 | Temp 97.9°F | Resp 16 | Ht 63.0 in | Wt 174.0 lb

## 2012-09-07 DIAGNOSIS — M545 Low back pain, unspecified: Secondary | ICD-10-CM | POA: Insufficient documentation

## 2012-09-07 DIAGNOSIS — Q762 Congenital spondylolisthesis: Secondary | ICD-10-CM | POA: Insufficient documentation

## 2012-09-07 DIAGNOSIS — W19XXXA Unspecified fall, initial encounter: Secondary | ICD-10-CM | POA: Insufficient documentation

## 2012-09-07 DIAGNOSIS — S20229A Contusion of unspecified back wall of thorax, initial encounter: Secondary | ICD-10-CM

## 2012-09-07 DIAGNOSIS — F0391 Unspecified dementia with behavioral disturbance: Secondary | ICD-10-CM | POA: Insufficient documentation

## 2012-09-07 DIAGNOSIS — M25859 Other specified joint disorders, unspecified hip: Secondary | ICD-10-CM | POA: Insufficient documentation

## 2012-09-07 DIAGNOSIS — M25559 Pain in unspecified hip: Secondary | ICD-10-CM

## 2012-09-07 DIAGNOSIS — S20221A Contusion of right back wall of thorax, initial encounter: Secondary | ICD-10-CM

## 2012-09-07 DIAGNOSIS — M25551 Pain in right hip: Secondary | ICD-10-CM

## 2012-09-07 NOTE — Progress Notes (Signed)
OFFICE NOTE  09/07/2012  CC:  Chief Complaint  Patient presents with  . Fall     HPI: Patient is a 79 y.o. Caucasian female who is here with her son, Laura Savage, for recent fall, hips hurting. Social work/home health referral pending due to advancing dementia and living with husband who is unable to take care of her anymore.   She recently fell, apparently this happened a couple of nights ago when she had wandered out of house at [facility].  EMS evaluated her and determined her to be ok not to take to ED. She has been walking slower and complaining of some pain in low back and hips/buttocks, mainly right side. Apparently she says this is the part of her body that took the brunt of the fall.  No LOC, no head injury.  Pertinent PMH:  Past Medical History  Diagnosis Date  . Hypertension   . Dementia   . Depression   . Osteoarthritis   . Chronic renal insufficiency, stage III (moderate)     2013 renal u/s: medical renal dz  . Insomnia   . LBBB (left bundle branch block) 07/2006   Past Surgical History  Procedure Laterality Date  . Cardiovascular stress test  07/2006    No significant obstructive dz, normal LV EF but dyssynergic LV wall contraction c/w LBBB.  . Cardiac catheterization  07/2006    Aneurismal dilatation of coronaries, no significant astherosclerotic dz, normal LV function  . Vaginal hysterectomy  08/2002  . Rectocele repair  08/2002    with cystourethrocele repair at the same time    MEDS:  Outpatient Prescriptions Prior to Visit  Medication Sig Dispense Refill  . ALPRAZolam (XANAX) 1 MG tablet Take 1 mg by mouth at bedtime as needed for sleep.      Marland Kitchen aspirin 81 MG tablet Take 81 mg by mouth daily.      Marland Kitchen losartan-hydrochlorothiazide (HYZAAR) 50-12.5 MG per tablet Take 1 tablet by mouth daily.      . meloxicam (MOBIC) 7.5 MG tablet Take 7.5 mg by mouth 2 (two) times daily as needed for pain.      . Multiple Vitamins-Minerals (MULTIVITAMIN & MINERAL PO) Take 1 tablet by  mouth daily. [facility].      . Omega-3 Fatty Acids (OMEGA-3 FISH OIL) 1200 MG CAPS Take 1 each by mouth daily. Nature Made + 360 mg OMEGA 3      . omeprazole (PRILOSEC) 40 MG capsule Take 1 capsule (40 mg total) by mouth daily.  90 capsule  1  . potassium chloride SA (K-DUR,KLOR-CON) 20 MEQ tablet Take 1 tablet (20 mEq total) by mouth daily.  90 tablet  0  . sertraline (ZOLOFT) 100 MG tablet Take 1 tablet (100 mg total) by mouth daily.  90 tablet  0  . vitamin B-12 (CYANOCOBALAMIN) 1000 MCG tablet Take 1,000 mcg by mouth daily. Nature Made.       No facility-administered medications prior to visit.    PE: Blood pressure 153/83, pulse 78, temperature 97.9 F (36.6 C), temperature source Temporal, resp. rate 16, height 5\' 3"  (1.6 m), weight 174 lb (78.926 kg), SpO2 96.00%.  Gen: Alert, well appearing.  Patient is oriented to person only.  She is pleasant and can answer general questions about her physical condition but otherwise is "pleasantly demented".  She can say her day of birth but cannot recall the year.  She has no recall of 3 words she is asked to remember (she cannot even  repeat them back after 1-2 seconds).  When asked to tell who is sitting next to her in the room (her son) she says, "this is my buddy", but when asked to be more specific she is unable to.  She laughs a lot, seems genuinely happy. CV: RRR, no m/r/g.   LUNGS: CTA bilat, nonlabored resps, good aeration in all lung fields. Buttocks:  She has a baseball sized area of ecchymoses on right medial glut region.  She has tenderness to palpation in right glut and lower lumbar spine/SI region.  ROM of right hip brings pain in right LB/hip.  I am unable to discern whether or not ROM of left hip causes pain b/c she flinches a lot and says the right leg wants to "jerk" and she holds it stiff when I attempt to flex it at the knee. Thighs are nontender.  Knees without swelling or tenderness.  Ankles and feet without swelling or  tenderness. She walks with a cane, wobbly and slow.   IMPRESSION AND PLAN:  1) L/S spine and gluteal region contusion s/p fall 2 nights ago.  Will obtain x-rays of L/S spine and right hip today.  2) Advanced dementia; she has significantly worsened/declined significantly over the last year per pt's son and step daughter. We have asked advanced home care to consult; determine what services may be available to them to make their living environment safer.  She may ultimately require NH supervision.  3) CRI, hypokalemia. She had bmet a few days ago showing cr 1.6 and k 3.2. I started her on 20 mEq qd KDUR.  FOLLOW UP: 2 wks-f/u BMET needed at that time

## 2012-09-07 NOTE — Assessment & Plan Note (Signed)
Recheck BMET to check stability of cr, also K.

## 2012-09-07 NOTE — Assessment & Plan Note (Addendum)
This is impairing her ability to take care of herself. I recommended she be cared for in an ALF or NH environment since her husband is unable to adequately care for/supervise her, and her daughter has reached the limit of stress level in trying to care for her. Pt's husband will not allow this currently, however, and we settled for a HH nursing referral/evaluation---ordered today.

## 2012-09-13 ENCOUNTER — Encounter: Payer: Self-pay | Admitting: Family Medicine

## 2012-09-13 ENCOUNTER — Ambulatory Visit (INDEPENDENT_AMBULATORY_CARE_PROVIDER_SITE_OTHER): Payer: Medicare HMO | Admitting: Family Medicine

## 2012-09-13 VITALS — BP 128/73 | HR 82 | Temp 97.5°F | Resp 16 | Ht 63.0 in | Wt 171.0 lb

## 2012-09-13 DIAGNOSIS — F0391 Unspecified dementia with behavioral disturbance: Secondary | ICD-10-CM

## 2012-09-13 DIAGNOSIS — F03918 Unspecified dementia, unspecified severity, with other behavioral disturbance: Secondary | ICD-10-CM

## 2012-09-13 DIAGNOSIS — S300XXD Contusion of lower back and pelvis, subsequent encounter: Secondary | ICD-10-CM

## 2012-09-13 DIAGNOSIS — Z5189 Encounter for other specified aftercare: Secondary | ICD-10-CM

## 2012-09-13 NOTE — Progress Notes (Addendum)
OFFICE NOTE  09/13/2012  CC:  Chief Complaint  Patient presents with  . Follow-up     HPI: Patient is a 79 y.o. Caucasian female who is here with her son for 6 d f/u hip and low back pain s/p a fall, also f/u dementia. Pain is a little improved.  Dementia unchanged since last visit--we discussed the fact that this seems to have progressed quite a bit over the last 3-6 mo.   Pertinent PMH:  Past Medical History  Diagnosis Date  . Hypertension   . Dementia   . Depression   . Osteoarthritis   . Chronic renal insufficiency, stage III (moderate)     2013 renal u/s: medical renal dz  . Insomnia   . LBBB (left bundle branch block) 07/2006    MEDS:  Outpatient Prescriptions Prior to Visit  Medication Sig Dispense Refill  . ALPRAZolam (XANAX) 1 MG tablet Take 1 mg by mouth at bedtime as needed for sleep.      Marland Kitchen aspirin 81 MG tablet Take 81 mg by mouth daily.      Marland Kitchen losartan-hydrochlorothiazide (HYZAAR) 50-12.5 MG per tablet Take 1 tablet by mouth daily.      . meloxicam (MOBIC) 7.5 MG tablet Take 7.5 mg by mouth 2 (two) times daily as needed for pain.      . Multiple Vitamins-Minerals (MULTIVITAMIN & MINERAL PO) Take 1 tablet by mouth daily. [facility].      . Omega-3 Fatty Acids (OMEGA-3 FISH OIL) 1200 MG CAPS Take 1 each by mouth daily. Nature Made + 360 mg OMEGA 3      . omeprazole (PRILOSEC) 40 MG capsule Take 1 capsule (40 mg total) by mouth daily.  90 capsule  1  . potassium chloride SA (K-DUR,KLOR-CON) 20 MEQ tablet Take 1 tablet (20 mEq total) by mouth daily.  90 tablet  0  . sertraline (ZOLOFT) 100 MG tablet Take 1 tablet (100 mg total) by mouth daily.  90 tablet  0  . vitamin B-12 (CYANOCOBALAMIN) 1000 MCG tablet Take 1,000 mcg by mouth daily. Nature Made.       No facility-administered medications prior to visit.    PE: Blood pressure 128/73, pulse 82, temperature 97.5 F (36.4 C), temperature source Temporal, resp. rate 16, height 5\' 3"  (1.6 m), weight 171 lb  (77.565 kg), SpO2 97.00%. Gen: alert, oriented to person, general place, general situation. Affect: happy/giddy.  Occasionally speaks with lucidity of thought, but occasionally repeats things and laughs inappropriately. Mild TTP right glut, with large ecchymoses and slight palpable subQ hematoma in right gluteal region.  No tenderness of right anterior hip or right thigh.   IMPRESSION AND PLAN:  1) Dementia, with behav disturbance. Discussed once again the possibility of checking a brain MRI to r/o other pathology given the fairly rapid decline in cognitive function over the last 3-6 mo compared to the previous couple of years.  Son will think about it and call if he wants to proceed with this.  He is currently looking over the options for his mother regarding long term care facilities, but right now Earnie's husband is staunchly against this.  2) Gluteal/sacral/hip contusion: stable, slight improvement in pain.  Continue to monitor, no ortho referral at this time but if not improving adequately over the next 1-2 wks we'll do this since her plain films had some indeterminate findings.   FOLLOW UP; 4 wks

## 2012-09-14 DIAGNOSIS — S300XXA Contusion of lower back and pelvis, initial encounter: Secondary | ICD-10-CM | POA: Insufficient documentation

## 2012-09-15 DIAGNOSIS — F0390 Unspecified dementia without behavioral disturbance: Secondary | ICD-10-CM

## 2012-09-15 DIAGNOSIS — N183 Chronic kidney disease, stage 3 (moderate): Secondary | ICD-10-CM

## 2012-09-15 DIAGNOSIS — I129 Hypertensive chronic kidney disease with stage 1 through stage 4 chronic kidney disease, or unspecified chronic kidney disease: Secondary | ICD-10-CM

## 2012-09-15 DIAGNOSIS — I447 Left bundle-branch block, unspecified: Secondary | ICD-10-CM

## 2012-09-18 ENCOUNTER — Telehealth: Payer: Self-pay | Admitting: Family Medicine

## 2012-09-18 DIAGNOSIS — G309 Alzheimer's disease, unspecified: Secondary | ICD-10-CM

## 2012-09-18 NOTE — Telephone Encounter (Signed)
Please advise 

## 2012-09-18 NOTE — Telephone Encounter (Signed)
OK. Order has been placed.

## 2012-09-18 NOTE — Telephone Encounter (Signed)
Patient has decided she does want the MRI. Patient's son is requesting an afternoon/evening appt at the [facility].

## 2012-09-21 ENCOUNTER — Telehealth: Payer: Self-pay | Admitting: Family Medicine

## 2012-09-21 ENCOUNTER — Ambulatory Visit: Payer: Medicare HMO | Admitting: Family Medicine

## 2012-09-21 MED ORDER — ALPRAZOLAM 1 MG PO TABS: 1.0000 mg | ORAL_TABLET | Freq: Every evening | ORAL | Status: DC | PRN

## 2012-09-21 NOTE — Telephone Encounter (Signed)
Rx printed

## 2012-09-21 NOTE — Telephone Encounter (Signed)
Laura Savage requesting refill for patient's xanax.  Last seen in office 09/13/12.  Last filled at pharmacy on 08/21/12.  Please advise.

## 2012-09-22 ENCOUNTER — Ambulatory Visit (HOSPITAL_COMMUNITY)
Admission: RE | Admit: 2012-09-22 | Discharge: 2012-09-22 | Disposition: A | Discharge status: Home / Self Care | Payer: Medicare HMO | Source: Ambulatory Visit | Attending: Family Medicine | Admitting: Family Medicine

## 2012-09-22 DIAGNOSIS — F0390 Unspecified dementia without behavioral disturbance: Secondary | ICD-10-CM | POA: Insufficient documentation

## 2012-09-22 DIAGNOSIS — G309 Alzheimer's disease, unspecified: Secondary | ICD-10-CM

## 2012-09-22 DIAGNOSIS — G319 Degenerative disease of nervous system, unspecified: Secondary | ICD-10-CM | POA: Insufficient documentation

## 2012-09-22 MED ORDER — GADOBENATE DIMEGLUMINE 529 MG/ML IV SOLN
10.0000 mL | Freq: Once | INTRAVENOUS | Status: AC | PRN
  Administered 2012-09-22: 8 mL via INTRAVENOUS

## 2012-09-22 NOTE — Telephone Encounter (Signed)
Faxed to pharmacy

## 2012-10-11 ENCOUNTER — Other Ambulatory Visit: Payer: Self-pay | Admitting: Family Medicine

## 2012-10-11 MED ORDER — SERTRALINE HCL 100 MG PO TABS: 100.0000 mg | ORAL_TABLET | Freq: Every day | ORAL | Status: AC

## 2012-10-12 ENCOUNTER — Ambulatory Visit (INDEPENDENT_AMBULATORY_CARE_PROVIDER_SITE_OTHER): Payer: Medicare HMO | Admitting: Family Medicine

## 2012-10-12 ENCOUNTER — Encounter: Payer: Self-pay | Admitting: Family Medicine

## 2012-10-12 VITALS — BP 162/82 | HR 75 | Temp 98.7°F | Resp 16 | Ht 63.0 in | Wt 174.0 lb

## 2012-10-12 DIAGNOSIS — Z5189 Encounter for other specified aftercare: Secondary | ICD-10-CM

## 2012-10-12 DIAGNOSIS — S300XXD Contusion of lower back and pelvis, subsequent encounter: Secondary | ICD-10-CM

## 2012-10-12 NOTE — Assessment & Plan Note (Signed)
All pain has resolved.

## 2012-10-12 NOTE — Progress Notes (Signed)
OFFICE NOTE  10/12/2012  CC:  Chief Complaint  Patient presents with  . Follow-up     HPI: Patient is a 79 y.o. Caucasian female who is here for 1 mo f/u dementia and buttock/hip contusion. She no longer has any buttock or hip pain.  She walks with a rolling walker with an unsteady gait, still has some falls but is not wandering off from her home recently.   Family has hired some people to help give nearly 24h care for [place] and her husband so they can remain in their home. Reviewed results of MRI brain with pt and her son again today--so signif change from 2012 MRI. The only new symptom the son reports today is that on a couple of occasions she commented on a visual hallucination she was having (the one example he can recall was "worms in her chicken fried steak"). No sign of auditory hallucinations.  No aggressive behavior or severe personality changes.  Pertinent PMH:  Past Medical History  Diagnosis Date  . Hypertension   . Dementia   . Depression   . Osteoarthritis   . Chronic renal insufficiency, stage III (moderate)     2013 renal u/s: medical renal dz  . Insomnia   . LBBB (left bundle branch block) 07/2006    MEDS:  Outpatient Prescriptions Prior to Visit  Medication Sig Dispense Refill  . ALPRAZolam (XANAX) 1 MG tablet Take 1 tablet (1 mg total) by mouth at bedtime as needed for sleep.  30 tablet  3  . aspirin 81 MG tablet Take 81 mg by mouth daily.      Marland Kitchen losartan-hydrochlorothiazide (HYZAAR) 50-12.5 MG per tablet Take 1 tablet by mouth daily.      . Multiple Vitamins-Minerals (MULTIVITAMIN & MINERAL PO) Take 1 tablet by mouth daily. [facility].      . Omega-3 Fatty Acids (OMEGA-3 FISH OIL) 1200 MG CAPS Take 1 each by mouth daily. Nature Made + 360 mg OMEGA 3      . potassium chloride SA (K-DUR,KLOR-CON) 20 MEQ tablet Take 1 tablet (20 mEq total) by mouth daily.  90 tablet  0  . sertraline (ZOLOFT) 100 MG tablet Take 1 tablet (100 mg total) by mouth daily.  90  tablet  1  . vitamin B-12 (CYANOCOBALAMIN) 1000 MCG tablet Take 1,000 mcg by mouth daily. Nature Made.      . meloxicam (MOBIC) 7.5 MG tablet Take 7.5 mg by mouth 2 (two) times daily as needed for pain.      Marland Kitchen omeprazole (PRILOSEC) 40 MG capsule Take 1 capsule (40 mg total) by mouth daily.  90 capsule  1   No facility-administered medications prior to visit.    PE: Blood pressure 162/82, pulse 75, temperature 98.7 F (37.1 C), temperature source Temporal, resp. rate 16, height 5\' 3"  (1.6 m), weight 174 lb (78.926 kg), SpO2 94.00%. Gen: Alert, well appearing.  Patient is oriented to person and general place and general situation. AFFECT: pleasant, lucid thought (for the most part) and speech.  Occasionally laughs inappropriately/giddy. Neuro: CN 2-12 intact bilaterally, strength 5/5 in proximal and distal upper extremities and lower extremities bilaterally.  No tremor.   No pronator drift.   IMPRESSION AND PLAN:  Dementia Progressing quite a bit over the last 1/2 year. MRI brain since last o/v showed unchanged global atrophy compared to MRI in 2012, no new abnormalities. She has infrequent, mild hallucinations.  No new meds at this time. Family still working out details of long  term care but at this time it appears everyone is content with her and her husband living at home with the help of hired caregivers (and the family) nearly round the clock.  Contusion, buttock All pain has resolved.   An After Visit Summary was printed and given to the patient.  FOLLOW UP: 70mo

## 2012-10-12 NOTE — Assessment & Plan Note (Signed)
Progressing quite a bit over the last 1/2 year. MRI brain since last o/v showed unchanged global atrophy compared to MRI in 2012, no new abnormalities. She has infrequent, mild hallucinations.  No new meds at this time. Family still working out details of long term care but at this time it appears everyone is content with her and her husband living at home with the help of hired caregivers (and the family) nearly round the clock.

## 2012-10-19 ENCOUNTER — Telehealth: Payer: Self-pay | Admitting: *Deleted

## 2012-10-19 NOTE — Telephone Encounter (Signed)
[facility] called requesting PT be stopped for patient. Please advise?

## 2012-10-19 NOTE — Telephone Encounter (Signed)
OK to stop PT 

## 2012-10-31 ENCOUNTER — Telehealth: Payer: Self-pay | Admitting: Family Medicine

## 2012-10-31 NOTE — Telephone Encounter (Signed)
noted 

## 2012-10-31 NOTE — Telephone Encounter (Signed)
Patient's son is concerned that patient is taking her husbands sleeping pills.  Patient has become almost incoherent recently.   Son is wondering if she needs to be checked for any medications other than what she is prescribed or if she needs to be put back on dementia medications.  I scheduled patient for an appointment on Friday in a 30 minute slot.

## 2012-11-03 ENCOUNTER — Encounter: Payer: Self-pay | Admitting: Family Medicine

## 2012-11-03 ENCOUNTER — Ambulatory Visit (INDEPENDENT_AMBULATORY_CARE_PROVIDER_SITE_OTHER): Payer: Medicare HMO | Admitting: Family Medicine

## 2012-11-03 VITALS — BP 134/79 | HR 84 | Temp 99.6°F | Resp 16 | Ht 63.0 in | Wt 175.0 lb

## 2012-11-03 DIAGNOSIS — T50904A Poisoning by unspecified drugs, medicaments and biological substances, undetermined, initial encounter: Secondary | ICD-10-CM

## 2012-11-03 DIAGNOSIS — F19921 Other psychoactive substance use, unspecified with intoxication with delirium: Secondary | ICD-10-CM

## 2012-11-03 DIAGNOSIS — F0391 Unspecified dementia with behavioral disturbance: Secondary | ICD-10-CM

## 2012-11-03 DIAGNOSIS — Z23 Encounter for immunization: Secondary | ICD-10-CM

## 2012-11-03 MED ORDER — DONEPEZIL HCL 5 MG PO TABS: ORAL_TABLET | ORAL | Status: DC

## 2012-11-03 NOTE — Assessment & Plan Note (Addendum)
Agreed to retrial of aricept at 5mg  x 70mo. Reassess after 1 mo and potentially get her on full 10mg  qd dosing at that time.  Also, since there is some suspicion that possibly she could have been given a lorazepam tab to help her sleep, per son's request I'll check serum for the presence of lorazepam.  She attempted to give urine sample for UDS today but she missed the nun's cap entirely so no sample could be obtained.

## 2012-11-03 NOTE — Progress Notes (Signed)
OFFICE NOTE  11/03/2012  CC:  Chief Complaint  Patient presents with  . Dementia    could it be drug induced.   . Medication Problem    should she still be taking namenda and aricept     HPI: Patient is a 79 y.o. Caucasian female who is here with son--son has concerns about his mom possibly being given "extra sleeping pills" at some point, he's not really clear or sure.   He feels like she may be acting different on some occasions due to being given an extra sleeping pill by someone at night.  She normally takes alprazolam qhs but son notes that her husband takes a sleeping med as well (I looked in pt's chart today b/c he is my patient, too, and he has been rx'd lorazepam in the past but this was only #60 with 1 RF about 6 months ago.  Other examples that have son puzzled: she speaks in some random broken sentences at times.  Sometimes speech is slurred and she seems to be acting drunk.  She has not had any visual hallucinations since I last saw her, nor has she had any further episode of wandering.  Son wonders if her progression over the last few months could be related to my d/c'ing her aricept and namenda when she first started to come see me.  We reviewed the notes from those visits and it is clear that she was declining rapidly before I stopped her meds but I said it is possible that some of her decline since stopping meds is b/c she "needs" the meds.  I said I was open to a retrial of the meds to see how she does.  Pertinent PMH:  Past Medical History  Diagnosis Date  . Hypertension   . Dementia   . Depression   . Osteoarthritis   . Chronic renal insufficiency, stage III (moderate)     2013 renal u/s: medical renal dz  . Insomnia   . LBBB (left bundle branch block) 07/2006    MEDS:  Outpatient Prescriptions Prior to Visit  Medication Sig Dispense Refill  . ALPRAZolam (XANAX) 1 MG tablet Take 1 tablet (1 mg total) by mouth at bedtime as needed for sleep.  30 tablet  3  .  losartan-hydrochlorothiazide (HYZAAR) 50-12.5 MG per tablet Take 1 tablet by mouth daily.      . Multiple Vitamins-Minerals (MULTIVITAMIN & MINERAL PO) Take 1 tablet by mouth daily. [facility].      . Omega-3 Fatty Acids (OMEGA-3 FISH OIL) 1200 MG CAPS Take 1 each by mouth daily. Nature Made + 360 mg OMEGA 3      . sertraline (ZOLOFT) 100 MG tablet Take 1 tablet (100 mg total) by mouth daily.  90 tablet  1  . vitamin B-12 (CYANOCOBALAMIN) 1000 MCG tablet Take 1,000 mcg by mouth daily. Nature Made.      . aspirin 81 MG tablet Take 81 mg by mouth daily.      . meloxicam (MOBIC) 7.5 MG tablet Take 7.5 mg by mouth 2 (two) times daily as needed for pain.      Marland Kitchen omeprazole (PRILOSEC) 40 MG capsule Take 1 capsule (40 mg total) by mouth daily.  90 capsule  1  . potassium chloride SA (K-DUR,KLOR-CON) 20 MEQ tablet Take 1 tablet (20 mEq total) by mouth daily.  90 tablet  0   No facility-administered medications prior to visit.    PE: Blood pressure 134/79, pulse 84, temperature 99.6 F (  37.6 C), temperature source Temporal, resp. rate 16, height 5\' 3"  (1.6 m), weight 175 lb (79.379 kg), SpO2 95.00%. Gen: Alert, well appearing.  Patient is oriented to person.  She smiles and answers questions but displays lucid thought only intermittently.  No further exam today.   IMPRESSION AND PLAN:  Dementia with behavioral disturbance Agreed to retrial of aricept at 5mg  x 441mo. Reassess after 1 mo and potentially get her on full 10mg  qd dosing at that time.  Also, since there is some suspicion that possibly she could have been given a lorazepam tab to help her sleep, per son's request I'll check serum for the presence of lorazepam.  She attempted to give urine sample for UDS today but she missed the nun's cap entirely so no sample could be obtained.   Flu vaccine IM today.  FOLLOW UP: 441mo

## 2012-11-07 LAB — LORAZEPAM: Lorazepam: <10 ng/mL — ABNORMAL LOW (ref 50–240)

## 2012-12-07 ENCOUNTER — Ambulatory Visit: Payer: Medicare HMO | Admitting: Family Medicine

## 2012-12-07 DIAGNOSIS — Z0289 Encounter for other administrative examinations: Secondary | ICD-10-CM

## 2012-12-26 ENCOUNTER — Telehealth: Payer: Self-pay | Admitting: Family Medicine

## 2012-12-26 NOTE — Telephone Encounter (Signed)
Patient's son wanted to know drug screen results.  Looks like the blood test we ordered was canceled.   Can you tell what happened?

## 2012-12-26 NOTE — Telephone Encounter (Signed)
Wants to go over July 2014 results. Please call patient's son.

## 2012-12-27 NOTE — Telephone Encounter (Signed)
I have no idea what happened. Pls notify the son and tell him I'll call him after office hours finish today.-thx

## 2012-12-28 ENCOUNTER — Other Ambulatory Visit: Payer: Self-pay | Admitting: Family Medicine

## 2012-12-28 MED ORDER — DONEPEZIL HCL 5 MG PO TABS: ORAL_TABLET | ORAL | Status: DC

## 2013-01-05 ENCOUNTER — Other Ambulatory Visit: Payer: Self-pay | Admitting: Family Medicine

## 2013-01-05 MED ORDER — POTASSIUM CHLORIDE CRYS ER 20 MEQ PO TBCR: 20.0000 meq | EXTENDED_RELEASE_TABLET | Freq: Every day | ORAL | Status: AC

## 2013-01-23 ENCOUNTER — Telehealth: Payer: Self-pay | Admitting: Family Medicine

## 2013-01-23 NOTE — Telephone Encounter (Signed)
Patient requesting refill on alprazolam 1mg .  Patient last seen on 11/03/12.  Last rx was printed 09/21/12 x 3 refills.  Please advsie.

## 2013-01-24 MED ORDER — ALPRAZOLAM 1 MG PO TABS: 1.0000 mg | ORAL_TABLET | Freq: Every evening | ORAL | Status: DC | PRN

## 2013-01-24 NOTE — Telephone Encounter (Signed)
rx was faxed to pharmacy.  

## 2013-01-24 NOTE — Telephone Encounter (Signed)
Alprazolam rx printed. 

## 2013-02-12 ENCOUNTER — Ambulatory Visit (INDEPENDENT_AMBULATORY_CARE_PROVIDER_SITE_OTHER): Payer: Medicare HMO | Admitting: Family Medicine

## 2013-02-12 ENCOUNTER — Encounter: Payer: Self-pay | Admitting: Family Medicine

## 2013-02-12 ENCOUNTER — Ambulatory Visit (HOSPITAL_BASED_OUTPATIENT_CLINIC_OR_DEPARTMENT_OTHER)
Admission: RE | Admit: 2013-02-12 | Discharge: 2013-02-12 | Disposition: A | Discharge status: Home / Self Care | Payer: Medicare HMO | Source: Ambulatory Visit | Attending: Family Medicine | Admitting: Family Medicine

## 2013-02-12 VITALS — BP 103/65 | HR 79 | Temp 98.8°F | Resp 18

## 2013-02-12 DIAGNOSIS — R509 Fever, unspecified: Secondary | ICD-10-CM

## 2013-02-12 DIAGNOSIS — R059 Cough, unspecified: Secondary | ICD-10-CM

## 2013-02-12 DIAGNOSIS — J111 Influenza due to unidentified influenza virus with other respiratory manifestations: Secondary | ICD-10-CM

## 2013-02-12 DIAGNOSIS — R05 Cough: Secondary | ICD-10-CM | POA: Insufficient documentation

## 2013-02-12 MED ORDER — OSELTAMIVIR PHOSPHATE 30 MG PO CAPS: ORAL_CAPSULE | ORAL | Status: DC

## 2013-02-12 NOTE — Progress Notes (Addendum)
Pre visit review using our clinic review tool, if applicable. No additional management support is needed unless otherwise documented below in the visit note. OFFICE NOTE  02/12/2013  CC:  Chief Complaint  Patient presents with  . Fever    friday night 102 temp  . Cough     HPI: Patient is a 79 y.o. Caucasian female who is here with her son for fever. Onset of fever to 102 four nights ago, Tm 99.8 tympanic at home since then.  Taking alleve and tylenol. Lots of nasal congestion/runny nose, cough, generalized weakness, more periods of disorientation. No n/v/d.  Pertinent PMH: Past Medical History  Diagnosis Date  . Hypertension   . Dementia   . Depression   . Osteoarthritis   . Chronic renal insufficiency, stage III (moderate)     2013 renal u/s: medical renal dz  . Insomnia   . LBBB (left bundle branch block) 07/2006    MEDS:  Outpatient Prescriptions Prior to Visit  Medication Sig Dispense Refill  . ALPRAZolam (XANAX) 1 MG tablet Take 1 tablet (1 mg total) by mouth at bedtime as needed for sleep.  30 tablet  3  . aspirin 81 MG tablet Take 81 mg by mouth daily.      Marland Kitchen donepezil (ARICEPT) 5 MG tablet 1 tab po qhs  30 tablet  3  . losartan-hydrochlorothiazide (HYZAAR) 50-12.5 MG per tablet Take 1 tablet by mouth daily.      . Multiple Vitamins-Minerals (MULTIVITAMIN & MINERAL PO) Take 1 tablet by mouth daily. [facility].      . Omega-3 Fatty Acids (OMEGA-3 FISH OIL) 1200 MG CAPS Take 1 each by mouth daily. Nature Made + 360 mg OMEGA 3      . omeprazole (PRILOSEC) 40 MG capsule Take 1 capsule (40 mg total) by mouth daily.  90 capsule  1  . potassium chloride SA (K-DUR,KLOR-CON) 20 MEQ tablet Take 1 tablet (20 mEq total) by mouth daily.  90 tablet  1  . sertraline (ZOLOFT) 100 MG tablet Take 1 tablet (100 mg total) by mouth daily.  90 tablet  1  . vitamin B-12 (CYANOCOBALAMIN) 1000 MCG tablet Take 1,000 mcg by mouth daily. Nature Made.      . meloxicam (MOBIC) 7.5 MG  tablet Take 7.5 mg by mouth 2 (two) times daily as needed for pain.       No facility-administered medications prior to visit.    PE: Blood pressure 103/65, pulse 79, temperature 98.8 F (37.1 C), temperature source Temporal, resp. rate 18, SpO2 95.00%. Gen: alert, NAD.  Sitting in wheelchair. Responds to questions. HEENT: eyes without injection, drainage, or swelling.  Ears: EACs clear, TMs with normal light reflex and landmarks.  Nose: Clear rhinorrhea, with some dried, crusty exudate adherent to mildly injected mucosa.  No purulent d/c.  No paranasal sinus TTP.  No facial swelling.  Throat and mouth without focal lesion.  No pharyngial swelling, erythema, or exudate.   Neck: supple, no LAD.   LUNGS: CTA bilat, nonlabored resps.   CV: RRR EXT: no c/c/e SKIN: no rash  LABS: none today  IMPRESSION AND PLAN:  Influenza-like illness:   Tamiflu 30mg  bid. Check CXR for infiltrate today since she is having lingering fevers. Robitussin DM.  Fluids, rest.  FOLLOW UP: prn

## 2013-02-12 NOTE — Patient Instructions (Signed)
Take mucinex OR robitussin, not both. Continue tylenol every 6 hours for pain or fever.

## 2013-02-14 ENCOUNTER — Telehealth: Payer: Self-pay | Admitting: Family Medicine

## 2013-02-14 NOTE — Telephone Encounter (Signed)
Per Dr. Milinda Cave, Please just put her on the nurse schedule to have place at patient convenience.

## 2013-02-14 NOTE — Telephone Encounter (Signed)
Patient needs TB skin test done before being admitted into nursing facility. She will be admitted 02/21/12. Please call to advise.

## 2013-02-14 NOTE — Telephone Encounter (Signed)
I SW patient's son. He may need to take her to an Urgent Care, he doesn't think he can bring her in today.

## 2013-02-16 ENCOUNTER — Encounter: Payer: Self-pay | Admitting: Family Medicine

## 2013-03-01 DIAGNOSIS — R262 Difficulty in walking, not elsewhere classified: Secondary | ICD-10-CM

## 2013-03-01 DIAGNOSIS — M1990 Unspecified osteoarthritis, unspecified site: Secondary | ICD-10-CM

## 2013-03-01 DIAGNOSIS — F0280 Dementia in other diseases classified elsewhere without behavioral disturbance: Secondary | ICD-10-CM

## 2013-03-01 DIAGNOSIS — G309 Alzheimer's disease, unspecified: Secondary | ICD-10-CM

## 2013-03-01 DIAGNOSIS — IMO0001 Reserved for inherently not codable concepts without codable children: Secondary | ICD-10-CM

## 2013-06-06 ENCOUNTER — Telehealth: Payer: Self-pay | Admitting: Family Medicine

## 2013-06-06 NOTE — Telephone Encounter (Signed)
Per Dr Milinda CaveMcGowen, verbal order given for pt to take a xanax 1 mg tab today for agitation.

## 2013-06-06 NOTE — Telephone Encounter (Signed)
Pt's home called and LMOM stating pt is very agitated and they wanted to see if you could Rx something for PRN use to decrease agitation.    [facility] because I saw xanax in Pt's med list but they said they could not administer it because it was Bedtime PRN use.  Please advise.

## 2013-06-13 ENCOUNTER — Encounter: Payer: Self-pay | Admitting: Family Medicine

## 2013-06-13 ENCOUNTER — Ambulatory Visit (INDEPENDENT_AMBULATORY_CARE_PROVIDER_SITE_OTHER): Payer: Medicare HMO | Admitting: Family Medicine

## 2013-06-13 ENCOUNTER — Telehealth: Payer: Self-pay | Admitting: Family Medicine

## 2013-06-13 VITALS — BP 132/78 | HR 72 | Temp 98.4°F | Resp 18 | Ht 63.0 in | Wt 169.0 lb

## 2013-06-13 DIAGNOSIS — N183 Chronic kidney disease, stage 3 unspecified: Secondary | ICD-10-CM

## 2013-06-13 DIAGNOSIS — S0083XA Contusion of other part of head, initial encounter: Secondary | ICD-10-CM

## 2013-06-13 DIAGNOSIS — F0391 Unspecified dementia with behavioral disturbance: Secondary | ICD-10-CM

## 2013-06-13 DIAGNOSIS — I10 Essential (primary) hypertension: Secondary | ICD-10-CM

## 2013-06-13 DIAGNOSIS — S1093XA Contusion of unspecified part of neck, initial encounter: Secondary | ICD-10-CM

## 2013-06-13 DIAGNOSIS — F03918 Unspecified dementia, unspecified severity, with other behavioral disturbance: Secondary | ICD-10-CM

## 2013-06-13 DIAGNOSIS — S0003XA Contusion of scalp, initial encounter: Secondary | ICD-10-CM

## 2013-06-13 LAB — BASIC METABOLIC PANEL
BUN: 22 mg/dL (ref 6–23)
CO2: 30 meq/L (ref 19–32)
Calcium: 10.7 mg/dL — ABNORMAL HIGH (ref 8.4–10.5)
Chloride: 103 mEq/L (ref 96–112)
Creatinine, Ser: 1.5 mg/dL — ABNORMAL HIGH (ref 0.4–1.2)
GFR: 35.62 mL/min — ABNORMAL LOW (ref >60.00)
Glucose, Bld: 89 mg/dL (ref 70–99)
Potassium: 4.3 mEq/L (ref 3.5–5.1)
SODIUM: 139 meq/L (ref 135–145)

## 2013-06-13 MED ORDER — ALPRAZOLAM 1 MG PO TABS: ORAL_TABLET | ORAL | Status: DC

## 2013-06-13 NOTE — Assessment & Plan Note (Signed)
With htn, on losartan/hctz. Check BMET today.

## 2013-06-13 NOTE — Assessment & Plan Note (Signed)
Continue current meds but also add 1/2 of a 1mg  alprazolam tab at 12 Noon daily to try to quell a bit of her afternoon behavioral issues.

## 2013-06-13 NOTE — Progress Notes (Signed)
OFFICE NOTE  06/13/2013  CC:  Chief Complaint  Patient presents with  . Hospitalization Follow-up    [facility]  . Fall  . URI    cough, runny nose x 1 week     HPI: Patient is a 80 y.o. Caucasian female who is here for 5 d f/u ED visit [facility] for a fall. She doesn't know how/why she fell, but recalls falling onto face.  Apparently no LOC. [facility] ED and I reviewed entire ED record today: CT head neg, VS normal, exam showed some facial bruising.  Cath UA was done for unclear reason and showed +nitrite, trace LE, trace lysed blood.  No meds rx'd. She is currently taking no pain meds. Says she feels mild soreness in face/around eyes/forehead but otherwise completely like her normal self.  Daughter in law with her today, reports Glena Norfolkolly having more consistent behavioral changes starting in early afternoon--going into other patient's rooms, a bit more moody.  On one occasion recently i did authorize a one time alprazolam dose to calm her down, but she usually doesn't get belligerent.  ROS: denies dysuria.  No change in chronic mild urinary urgency and frequency.  No abd pain, flank pain, or nausea.  Pertinent PMH:  Past medical, surgical, social, and family history reviewed and no changes are noted since last office visit.  MEDS:  Not taking tamiflu listed below Outpatient Prescriptions Prior to Visit  Medication Sig Dispense Refill  . ALPRAZolam (XANAX) 1 MG tablet Take 1 tablet (1 mg total) by mouth at bedtime as needed for sleep.  30 tablet  3  . aspirin 81 MG tablet Take 81 mg by mouth daily.      Marland Kitchen. donepezil (ARICEPT) 5 MG tablet 1 tab po qhs  30 tablet  3  . losartan-hydrochlorothiazide (HYZAAR) 50-12.5 MG per tablet Take 1 tablet by mouth daily.      . meloxicam (MOBIC) 7.5 MG tablet Take 7.5 mg by mouth 2 (two) times daily as needed for pain.      . Multiple Vitamins-Minerals (MULTIVITAMIN & MINERAL PO) Take 1 tablet by mouth daily. [facility].      .  Omega-3 Fatty Acids (OMEGA-3 FISH OIL) 1200 MG CAPS Take 1 each by mouth daily. Nature Made + 360 mg OMEGA 3      . omeprazole (PRILOSEC) 40 MG capsule Take 1 capsule (40 mg total) by mouth daily.  90 capsule  1  . potassium chloride SA (K-DUR,KLOR-CON) 20 MEQ tablet Take 1 tablet (20 mEq total) by mouth daily.  90 tablet  1  . sertraline (ZOLOFT) 100 MG tablet Take 1 tablet (100 mg total) by mouth daily.  90 tablet  1  . vitamin B-12 (CYANOCOBALAMIN) 1000 MCG tablet Take 1,000 mcg by mouth daily. Nature Made.      . oseltamivir (TAMIFLU) 30 MG capsule 1 tab po bid x 5d  10 capsule  0   No facility-administered medications prior to visit.    PE: Blood pressure 132/78, pulse 72, temperature 98.4 F (36.9 C), temperature source Temporal, resp. rate 18, height 5\' 3"  (1.6 m), weight 169 lb (76.658 kg), SpO2 97.00%. Gen: Alert, well appearing.  Patient is oriented to person, place, time, and situation. Ecchymoses around eyes, but no swelling of soft tissues except in a focal region about the size of a marble just above right orbit---minimal tenderness.  This is soft.   ENT: Ears: EACs clear, normal epithelium.  TMs with good light  reflex and landmarks bilaterally.  Eyes: no injection, icteris, swelling, or exudate.  EOMI, PERRLA. Nose: no drainage or turbinate edema/swelling.  No injection or focal lesion.  Mouth: lips without lesion/swelling.  Oral mucosa pink and moist.  Dentition intact and without obvious caries or gingival swelling.  Oropharynx without erythema, exudate, or swelling.  Neck: ROM full, nontender to palpation CV: RRR, no m/r/g.   LUNGS: CTA bilat, nonlabored resps, good aeration in all lung fields. Neuro: CN 2-12 intact bilaterally, strength 5/5 in proximal and distal upper extremities and lower extremities bilaterally.   No tremor.  No ataxia.   LAB: none today   Chemistry      Component Value Date/Time   NA 138 09/04/2012 1512   K 3.2* 09/04/2012 1512   CL 102 09/04/2012 1512    CO2 28 09/04/2012 1512   BUN 23 09/04/2012 1512   CREATININE 1.6* 09/04/2012 1512      Component Value Date/Time   CALCIUM 10.8* 09/04/2012 1512      IMPRESSION AND PLAN:  Forehead contusion With some peri-orbital ecchymoses:  All improving. No additional testing at this time.  Dementia with behavioral disturbance Continue current meds but also add 1/2 of a 1mg  alprazolam tab at 12 Noon daily to try to quell a bit of her afternoon behavioral issues.  Chronic renal insufficiency, stage III (moderate) With htn, on losartan/hctz. Check BMET today.  Asymptomatic bacteruria: no treatment indicated.  An After Visit Summary was printed and given to the patient.  FOLLOW UP: 34mo

## 2013-06-13 NOTE — Telephone Encounter (Signed)
Relevant patient education mailed to patient.  

## 2013-06-13 NOTE — Assessment & Plan Note (Signed)
With some peri-orbital ecchymoses:  All improving. No additional testing at this time.

## 2013-06-13 NOTE — Progress Notes (Signed)
Pre visit review using our clinic review tool, if applicable. No additional management support is needed unless otherwise documented below in the visit note. 

## 2013-07-31 ENCOUNTER — Ambulatory Visit (INDEPENDENT_AMBULATORY_CARE_PROVIDER_SITE_OTHER): Payer: Medicare HMO | Admitting: Nurse Practitioner

## 2013-07-31 ENCOUNTER — Encounter: Payer: Self-pay | Admitting: Nurse Practitioner

## 2013-07-31 VITALS — BP 99/63 | HR 72 | Temp 98.0°F | Ht 63.0 in

## 2013-07-31 DIAGNOSIS — R509 Fever, unspecified: Principal | ICD-10-CM

## 2013-07-31 DIAGNOSIS — J989 Respiratory disorder, unspecified: Secondary | ICD-10-CM

## 2013-07-31 DIAGNOSIS — Z792 Long term (current) use of antibiotics: Secondary | ICD-10-CM

## 2013-07-31 MED ORDER — AMOXICILLIN-POT CLAVULANATE 875-125 MG PO TABS: 1.0000 | ORAL_TABLET | Freq: Two times a day (BID) | ORAL | Status: DC

## 2013-07-31 MED ORDER — GUAIFENESIN 400 MG PO TABS: 400.0000 mg | ORAL_TABLET | Freq: Three times a day (TID) | ORAL | Status: DC

## 2013-07-31 MED ORDER — ALBUTEROL SULFATE HFA 108 (90 BASE) MCG/ACT IN AERS: 2.0000 | INHALATION_SPRAY | Freq: Two times a day (BID) | RESPIRATORY_TRACT | Status: AC

## 2013-07-31 MED ORDER — ALIGN 4 MG PO CAPS: ORAL_CAPSULE | ORAL | Status: AC

## 2013-07-31 NOTE — Patient Instructions (Signed)
Start all meds today. Follow up in 7-10 days or sooner if change in mental status, or persistent fever after 48 hours, or not eating or drinking.

## 2013-07-31 NOTE — Progress Notes (Signed)
Pre visit review using our clinic review tool, if applicable. No additional management support is needed unless otherwise documented below in the visit note. 

## 2013-08-04 NOTE — Progress Notes (Signed)
   Subjective:    Patient ID: Laura Savage, female    DOB: 1929/04/17, 80 y.o.   MRN: [id number]  HPI Comments: Ms Laura Savage presents in wc. She is accompnied by her son. She lives at an assisted living facility. Ms. Laura Savage is pleasantly confused. She does not answer questions appropriately.  Cough This is a new problem. The current episode started in the past 7 days (MAR shows tylenol given 2 days ago for fever.). The problem has been gradually worsening. The problem occurs every few minutes. The cough is productive of sputum. Associated symptoms include chest pain, a [facility] nasal congestion and a sore throat. She has tried OTC cough suppressant for the symptoms. The treatment provided no relief.      Review of Systems  Constitutional: Positive for fever ([facility]  HENT: Positive for sore throat.   Respiratory: Positive for cough.   Cardiovascular: Positive for chest pain.  Psychiatric/Behavioral: Positive for behavioral problems (per historical records) and confusion.       Objective:   Physical Exam  Vitals reviewed. Constitutional: She appears well-developed and well-nourished. No distress.  HENT:  Head: Normocephalic and atraumatic.  Right Ear: External ear normal.  Left Ear: External ear normal.  Mouth/Throat: Oropharynx is clear and moist. No oropharyngeal exudate.  Eyes: Conjunctivae are normal. Right eye exhibits no discharge. Left eye exhibits no discharge.  Neck: Normal range of motion. No thyromegaly present.  Cardiovascular: Normal rate, regular rhythm and normal heart sounds.   No murmur heard. Pulmonary/Chest: Effort normal. No respiratory distress. She has no wheezes. She has no rales.  BS diminished bases bilat, Wet sounding cough.  Lymphadenopathy:    She has no cervical adenopathy.  Neurological: She is alert.  Skin: Skin is warm and dry.  Psychiatric: Her behavior is normal.  Pleasant, follow  commands. Does not know where she is. Unable to follow conversation. Son does not feel her bevior or conversation content is out of norm for her.          Assessment & Plan:  1. Respiratory illness with fever Long-term care facility. - amoxicillin-clavulanate (AUGMENTIN) 875-125 MG per tablet; Take 1 tablet by mouth 2 (two) times daily.  Dispense: 20 tablet; Refill: 0 - albuterol (PROVENTIL HFA;VENTOLIN HFA) 108 (90 BASE) MCG/ACT inhaler; Inhale 2 puffs into the lungs 2 (two) times daily.  Dispense: 1 Inhaler; Refill: 0 - guaifenesin (HUMIBID E) 400 MG TABS tablet; Take 1 tablet (400 mg total) by mouth every 8 (eight) hours.  Dispense: 56 tablet; Refill: 0 - Probiotic Product (ALIGN) 4 MG CAPS; 1cap PO at lunch daily for 1 month.  Dispense: 30 capsule; Refill: 0

## 2013-08-09 ENCOUNTER — Ambulatory Visit (INDEPENDENT_AMBULATORY_CARE_PROVIDER_SITE_OTHER): Payer: Medicare HMO | Admitting: Family Medicine

## 2013-08-09 ENCOUNTER — Encounter: Payer: Self-pay | Admitting: Family Medicine

## 2013-08-09 VITALS — BP 114/67 | HR 74 | Temp 98.4°F | Resp 18 | Ht 63.0 in | Wt 166.0 lb

## 2013-08-09 DIAGNOSIS — R509 Fever, unspecified: Principal | ICD-10-CM

## 2013-08-09 DIAGNOSIS — J989 Respiratory disorder, unspecified: Secondary | ICD-10-CM

## 2013-08-09 DIAGNOSIS — Z23 Encounter for immunization: Secondary | ICD-10-CM

## 2013-08-09 NOTE — Progress Notes (Signed)
Pre visit review using our clinic review tool, if applicable. No additional management support is needed unless otherwise documented below in the visit note. 

## 2013-08-09 NOTE — Progress Notes (Signed)
OFFICE NOTE  08/09/2013  CC:  Chief Complaint  Patient presents with  . Follow-up    from visit with Layne     HPI: Patient is a 80 y.o. Caucasian female who is here for 10d f/u resp illness for which she has been treated with augmentin, albuterol, guaifenacin. Feels a lot better, acting a lot more like herself. Still a little cough.  No fevers.  Eating and drinking fine.  Pertinent PMH:  Past medical, surgical, social, and family history reviewed and no changes are noted since last office visit.  MEDS:  Outpatient Prescriptions Prior to Visit  Medication Sig Dispense Refill  . acetaminophen (TYLENOL) 500 MG tablet Take 500 mg by mouth every 6 (six) hours as needed.      Marland Kitchen. albuterol (PROVENTIL HFA;VENTOLIN HFA) 108 (90 BASE) MCG/ACT inhaler Inhale 2 puffs into the lungs 2 (two) times daily.  1 Inhaler  0  . ALPRAZolam (XANAX) 1 MG tablet 1/2 tab po q 12 NOON and 1 tab po qhs  45 tablet  5  . amoxicillin-clavulanate (AUGMENTIN) 875-125 MG per tablet Take 1 tablet by mouth 2 (two) times daily.  20 tablet  0  . aspirin 81 MG tablet Take 81 mg by mouth daily.      Marland Kitchen. donepezil (ARICEPT) 5 MG tablet 1 tab po qhs  30 tablet  3  . guaifenesin (HUMIBID E) 400 MG TABS tablet Take 1 tablet (400 mg total) by mouth every 8 (eight) hours.  56 tablet  0  . losartan-hydrochlorothiazide (HYZAAR) 50-12.5 MG per tablet Take 1 tablet by mouth daily.      . meloxicam (MOBIC) 7.5 MG tablet Take 7.5 mg by mouth 2 (two) times daily as needed for pain.      . Multiple Vitamins-Minerals (MULTIVITAMIN & MINERAL PO) Take 1 tablet by mouth daily. [facility].      Marland Kitchen. omeprazole (PRILOSEC) 40 MG capsule Take 1 capsule (40 mg total) by mouth daily.  90 capsule  1  . potassium chloride SA (K-DUR,KLOR-CON) 20 MEQ tablet Take 1 tablet (20 mEq total) by mouth daily.  90 tablet  1  . Probiotic Product (ALIGN) 4 MG CAPS 1cap PO at lunch daily for 1 month.  30 capsule  0  . Pseudoephedrine-DM-GG (ROBITUSSIN CF PO)  Take by mouth as needed.      . sertraline (ZOLOFT) 100 MG tablet Take 1 tablet (100 mg total) by mouth daily.  90 tablet  1   No facility-administered medications prior to visit.    PE: Blood pressure 114/67, pulse 74, temperature 98.4 F (36.9 C), temperature source Oral, resp. rate 18, height 5\' 3"  (1.6 m), weight 166 lb (75.297 kg), SpO2 97.00%. Gen: alert well-appearing, smiling and interactive CV: RRR, no m/r/g.   LUNGS: CTA bilat, nonlabored resps, good aeration in all lung fields. EXT: no clubbing, cyanosis, or edema.    IMPRESSION AND PLAN:  1) Febrile resp illness: resolving appropriately s/p abx. Continue albuterol and guaifenacin prn. Lives in long term care facility. Prevnar 13 IM today.  An After Visit Summary was printed and given to the patient.  FOLLOW UP: keep appt set for the end of august this year

## 2013-10-11 ENCOUNTER — Ambulatory Visit (INDEPENDENT_AMBULATORY_CARE_PROVIDER_SITE_OTHER): Payer: Commercial Managed Care - HMO | Admitting: Family Medicine

## 2013-10-11 ENCOUNTER — Encounter: Payer: Self-pay | Admitting: Family Medicine

## 2013-10-11 VITALS — BP 122/75 | HR 68 | Temp 98.4°F | Resp 18 | Ht 63.0 in | Wt 169.0 lb

## 2013-10-11 DIAGNOSIS — I10 Essential (primary) hypertension: Secondary | ICD-10-CM | POA: Insufficient documentation

## 2013-10-11 DIAGNOSIS — F03918 Unspecified dementia, unspecified severity, with other behavioral disturbance: Secondary | ICD-10-CM

## 2013-10-11 DIAGNOSIS — I129 Hypertensive chronic kidney disease with stage 1 through stage 4 chronic kidney disease, or unspecified chronic kidney disease: Secondary | ICD-10-CM

## 2013-10-11 DIAGNOSIS — Z0000 Encounter for general adult medical examination without abnormal findings: Secondary | ICD-10-CM | POA: Insufficient documentation

## 2013-10-11 DIAGNOSIS — Z23 Encounter for immunization: Secondary | ICD-10-CM

## 2013-10-11 DIAGNOSIS — N183 Chronic kidney disease, stage 3 unspecified: Secondary | ICD-10-CM

## 2013-10-11 DIAGNOSIS — F0391 Unspecified dementia with behavioral disturbance: Secondary | ICD-10-CM

## 2013-10-11 NOTE — Progress Notes (Signed)
OFFICE NOTE  10/11/2013  CC:  Chief Complaint  Patient presents with  . Follow-up   HPI: Patient is a 80 y.o. Caucasian female who is here for 4 mo f/u dementia with behavioral disturbance, HTN. Feeling good.  Here with Daughter in [place] today.   No acute complaints. Reviewed [facility] med rec form and it was unclear whether she is getting her meds dispensed --however, daughter in law with her today feels sure they are giving her meds but she'll double check for me. BP checks there weekly are recorded on this form and are 118-138 over 80-84.  Pertinent PMH:  Past medical, surgical, social, and family history reviewed and no changes are noted since last office visit.  MEDS: Not taking sudafed or align listed below Outpatient Prescriptions Prior to Visit  Medication Sig Dispense Refill  . acetaminophen (TYLENOL) 500 MG tablet Take 500 mg by mouth every 6 (six) hours as needed.      Marland Kitchen albuterol (PROVENTIL HFA;VENTOLIN HFA) 108 (90 BASE) MCG/ACT inhaler Inhale 2 puffs into the lungs 2 (two) times daily.  1 Inhaler  0  . ALPRAZolam (XANAX) 1 MG tablet 1/2 tab po q 12 NOON and 1 tab po qhs  45 tablet  5  . aspirin 81 MG tablet Take 81 mg by mouth daily.      Marland Kitchen donepezil (ARICEPT) 5 MG tablet 1 tab po qhs  30 tablet  3  . guaifenesin (HUMIBID E) 400 MG TABS tablet Take 1 tablet (400 mg total) by mouth every 8 (eight) hours.  56 tablet  0  . losartan-hydrochlorothiazide (HYZAAR) 50-12.5 MG per tablet Take 1 tablet by mouth daily.      . meloxicam (MOBIC) 7.5 MG tablet Take 7.5 mg by mouth 2 (two) times daily as needed for pain.      . Multiple Vitamins-Minerals (MULTIVITAMIN & MINERAL PO) Take 1 tablet by mouth daily. [facility].      Marland Kitchen omeprazole (PRILOSEC) 40 MG capsule Take 1 capsule (40 mg total) by mouth daily.  90 capsule  1  . potassium chloride SA (K-DUR,KLOR-CON) 20 MEQ tablet Take 1 tablet (20 mEq total) by mouth daily.  90 tablet  1  . sertraline (ZOLOFT)  100 MG tablet Take 1 tablet (100 mg total) by mouth daily.  90 tablet  1  . amoxicillin-clavulanate (AUGMENTIN) 875-125 MG per tablet Take 1 tablet by mouth 2 (two) times daily.  20 tablet  0  . Probiotic Product (ALIGN) 4 MG CAPS 1cap PO at lunch daily for 1 month.  30 capsule  0  . Pseudoephedrine-DM-GG (ROBITUSSIN CF PO) Take by mouth as needed.       No facility-administered medications prior to visit.    PE: Blood pressure 122/75, pulse 68, temperature 98.4 F (36.9 C), temperature source Temporal, resp. rate 18, height  (1.6 m), weight 169 lb (76.658 kg), SpO2 96.00%. Gen: Alert, well appearing.  Patient is oriented to person, place, time, and situation. Pleasantly demented. CV: RRR, no m/r/g.   LUNGS: CTA bilat, nonlabored resps, good aeration in all lung fields. EXT: no clubbing, cyanosis, or edema.   LAB:    Chemistry      Component Value Date/Time   NA 139 06/13/2013 1039   K 4.3 06/13/2013 1039   CL 103 06/13/2013 1039   CO2 30 06/13/2013 1039   BUN 22 06/13/2013 1039   CREATININE 1.5* 06/13/2013 1039      Component Value Date/Time  CALCIUM 10.7* 06/13/2013 1039      IMPRESSION AND PLAN:  1) Stable dementia with behav disturbance. Continue current meds.  2) HTN; The current medical regimen is effective;  continue present plan and medications. Will repeat labs (BMET) at next f/u in 4 mo.  3) CRI, stage 3: stable at most recent lab testing. Plan for BMET in 4 mo.  4) Prev health care: Flu IM and Tdap IM today.  An After Visit Summary was printed and given to the patient.  FOLLOW UP: 4 mo

## 2013-10-11 NOTE — Progress Notes (Signed)
Pre visit review using our clinic review tool, if applicable. No additional management support is needed unless otherwise documented below in the visit note. 

## 2013-12-25 ENCOUNTER — Encounter: Payer: Self-pay | Admitting: Family Medicine

## 2013-12-25 ENCOUNTER — Ambulatory Visit (INDEPENDENT_AMBULATORY_CARE_PROVIDER_SITE_OTHER): Payer: Medicare HMO | Admitting: Family Medicine

## 2013-12-25 VITALS — BP 131/73 | HR 68 | Temp 97.7°F | Resp 18 | Ht 63.0 in

## 2013-12-25 DIAGNOSIS — R21 Rash and other nonspecific skin eruption: Secondary | ICD-10-CM

## 2013-12-25 MED ORDER — FEXOFENADINE HCL 60 MG PO TABS: 60.0000 mg | ORAL_TABLET | Freq: Two times a day (BID) | ORAL | Status: AC

## 2013-12-25 NOTE — Progress Notes (Signed)
OFFICE NOTE  12/25/2013  CC:  Chief Complaint  Patient presents with  . Rash    on chest   HPI: Patient is a 80 y.o. Caucasian female who is here for rash.   Present for approx 1 wk per assistant who is with her from her ALF.  No family with her today. She seems to scratch the area around her neck a lot lately, almost exclusively left side but also some on upper/front of chest wall.  No known contact irritants or allergens.  No new meds.  No malaise or signs of illness.   Pertinent PMH:  Past medical, surgical, social, and family history reviewed and no changes are noted since last office visit.  MEDS:  Outpatient Prescriptions Prior to Visit  Medication Sig Dispense Refill  . acetaminophen (TYLENOL) 500 MG tablet Take 500 mg by mouth every 6 (six) hours as needed.    Marland Kitchen. albuterol (PROVENTIL HFA;VENTOLIN HFA) 108 (90 BASE) MCG/ACT inhaler Inhale 2 puffs into the lungs 2 (two) times daily. 1 Inhaler 0  . ALPRAZolam (XANAX) 1 MG tablet 1/2 tab po q 12 NOON and 1 tab po qhs 45 tablet 5  . aspirin 81 MG tablet Take 81 mg by mouth daily.    Marland Kitchen. donepezil (ARICEPT) 5 MG tablet 1 tab po qhs 30 tablet 3  . guaifenesin (HUMIBID E) 400 MG TABS tablet Take 1 tablet (400 mg total) by mouth every 8 (eight) hours. 56 tablet 0  . losartan-hydrochlorothiazide (HYZAAR) 50-12.5 MG per tablet Take 1 tablet by mouth daily.    . meloxicam (MOBIC) 7.5 MG tablet Take 7.5 mg by mouth 2 (two) times daily as needed for pain.    . Multiple Vitamins-Minerals (MULTIVITAMIN & MINERAL PO) Take 1 tablet by mouth daily. [facility].    Marland Kitchen. omeprazole (PRILOSEC) 40 MG capsule Take 1 capsule (40 mg total) by mouth daily. 90 capsule 1  . potassium chloride SA (K-DUR,KLOR-CON) 20 MEQ tablet Take 1 tablet (20 mEq total) by mouth daily. 90 tablet 1  . Probiotic Product (ALIGN) 4 MG CAPS 1cap PO at lunch daily for 1 month. 30 capsule 0  . Pseudoephedrine-DM-GG (ROBITUSSIN CF PO) Take by mouth as needed.    . sertraline  (ZOLOFT) 100 MG tablet Take 1 tablet (100 mg total) by mouth daily. 90 tablet 1   No facility-administered medications prior to visit.    PE: Blood pressure 131/73, pulse 68, temperature 97.7 F (36.5 C), temperature source Oral, resp. rate 18, height 5\' 3"  (1.6 m), SpO2 95 %. Gen: Alert, well appearing.  Patient is oriented to person and situation. Pleasant, interactive. SKIN: mild/mod general rubor to the skin around nape of neck on left side and anteriorly.  No focal lesions, no plaques, no excoriations or skin breakdown.  Question of mild bruising in L sternoclav region where she has rubbed it particularly vigorously.   IMPRESSION AND PLAN:  Itch--scratch--itch cycle, unclear etiology. Will treat conservatively at this time: generic allegra 60mg  bid and eucerin cream application daily. Discouraged pt from scratching the area if possible.  An After Visit Summary was printed and given to the patient.  FOLLOW UP: 2 wks to recheck rash

## 2013-12-25 NOTE — Progress Notes (Signed)
Pre visit review using our clinic review tool, if applicable. No additional management support is needed unless otherwise documented below in the visit note. 

## 2014-01-05 ENCOUNTER — Emergency Department (HOSPITAL_COMMUNITY)
Admission: EM | Admit: 2014-01-05 | Discharge: 2014-01-05 | Disposition: A | Discharge status: Home / Self Care | Payer: Medicare HMO | Attending: Emergency Medicine | Admitting: Emergency Medicine

## 2014-01-05 ENCOUNTER — Emergency Department (HOSPITAL_COMMUNITY): Payer: Medicare HMO

## 2014-01-05 ENCOUNTER — Encounter (HOSPITAL_COMMUNITY): Payer: Self-pay | Admitting: Emergency Medicine

## 2014-01-05 DIAGNOSIS — S0081XA Abrasion of other part of head, initial encounter: Secondary | ICD-10-CM | POA: Diagnosis not present

## 2014-01-05 DIAGNOSIS — Z79899 Other long term (current) drug therapy: Secondary | ICD-10-CM | POA: Diagnosis not present

## 2014-01-05 DIAGNOSIS — Z23 Encounter for immunization: Secondary | ICD-10-CM | POA: Insufficient documentation

## 2014-01-05 DIAGNOSIS — S0990XA Unspecified injury of head, initial encounter: Secondary | ICD-10-CM

## 2014-01-05 DIAGNOSIS — M1990 Unspecified osteoarthritis, unspecified site: Secondary | ICD-10-CM | POA: Insufficient documentation

## 2014-01-05 DIAGNOSIS — Y9389 Activity, other specified: Secondary | ICD-10-CM | POA: Diagnosis not present

## 2014-01-05 DIAGNOSIS — Y998 Other external cause status: Secondary | ICD-10-CM | POA: Insufficient documentation

## 2014-01-05 DIAGNOSIS — F0390 Unspecified dementia without behavioral disturbance: Secondary | ICD-10-CM | POA: Insufficient documentation

## 2014-01-05 DIAGNOSIS — F329 Major depressive disorder, single episode, unspecified: Secondary | ICD-10-CM | POA: Diagnosis not present

## 2014-01-05 DIAGNOSIS — N183 Chronic kidney disease, stage 3 (moderate): Secondary | ICD-10-CM | POA: Insufficient documentation

## 2014-01-05 DIAGNOSIS — W06XXXA Fall from bed, initial encounter: Secondary | ICD-10-CM | POA: Insufficient documentation

## 2014-01-05 DIAGNOSIS — Z7982 Long term (current) use of aspirin: Secondary | ICD-10-CM | POA: Insufficient documentation

## 2014-01-05 DIAGNOSIS — Y9289 Other specified places as the place of occurrence of the external cause: Secondary | ICD-10-CM | POA: Insufficient documentation

## 2014-01-05 DIAGNOSIS — I129 Hypertensive chronic kidney disease with stage 1 through stage 4 chronic kidney disease, or unspecified chronic kidney disease: Secondary | ICD-10-CM | POA: Diagnosis not present

## 2014-01-05 MED ORDER — TETANUS-DIPHTH-ACELL PERTUSSIS 5-2.5-18.5 LF-MCG/0.5 IM SUSP
0.5000 mL | Freq: Once | INTRAMUSCULAR | Status: AC
  Administered 2014-01-05: 0.5 mL via INTRAMUSCULAR
  Filled 2014-01-05: qty 0.5

## 2014-01-05 NOTE — ED Provider Notes (Signed)
CSN: [id number]     Arrival date & time 01/05/14  0419 History   First MD Initiated Contact with [id number] 01/05/14 [phone]     Chief Complaint  Patient presents with  . Fall   LEVEL 5 CAVEAT - DEMENTIA   Patient is a 80 y.o. female presenting with fall. The history is provided by the nursing home. The history is limited by the condition of the patient.  Fall This is a new problem. Episode onset: UNKNOWN. The problem occurs constantly. The problem has not changed since onset.Associated symptoms include headaches. Nothing aggravates the symptoms. Nothing relieves the symptoms.   Patient presents from nursing facility for head injury It is reported that patient fell out of bed and then got herself back into bed It was noted she has abrasion to her forehead Pt reports headache She has no other complaints Patient has h/o dementia and is a poor historian   Past Medical History  Diagnosis Date  . Hypertension   . Dementia     CT brain 05/2013: chronic atrophy and small vessel ischemic changes.  . Depression   . Osteoarthritis   . Chronic renal insufficiency, stage III (moderate)     2013 renal u/s: medical renal dz  . Insomnia   . LBBB (left bundle branch block) 07/2006   Past Surgical History  Procedure Laterality Date  . Cardiovascular stress test  07/2006    No significant obstructive dz, normal LV EF but dyssynergic LV wall contraction c/w LBBB.  . Cardiac catheterization  07/2006    Aneurismal dilatation of coronaries, no significant astherosclerotic dz, normal LV function  . Vaginal hysterectomy  08/2002  . Rectocele repair  08/2002    with cystourethrocele repair at the same time   No family history on file. History  Substance Use Topics  . Smoking status: Never Smoker   . Smokeless tobacco: Not on file  . Alcohol Use: No   OB History    No data available     Review of Systems  Unable to perform ROS: Dementia  Neurological: Positive for headaches.      Allergies   Review of patient's allergies indicates no known allergies.  Home Medications   Prior to Admission medications   Medication Sig Start Date End Date Taking? Authorizing Provider  acetaminophen (TYLENOL) 500 MG tablet Take 500 mg by mouth every 6 (six) hours as needed.    Historical Provider, MD  albuterol (PROVENTIL HFA;VENTOLIN HFA) 108 (90 BASE) MCG/ACT inhaler Inhale 2 puffs into the lungs 2 (two) times daily. 07/31/13   Kelle DartingLayne C Weaver, NP  ALPRAZolam Prudy Feeler(XANAX) 1 MG tablet 1/2 tab po q 12 NOON and 1 tab po qhs 06/13/13   Jeoffrey MassedPhilip H McGowen, MD  aspirin 81 MG tablet Take 81 mg by mouth daily.    Historical Provider, MD  donepezil (ARICEPT) 5 MG tablet 1 tab po qhs 12/28/12   Jeoffrey MassedPhilip H McGowen, MD  fexofenadine (ALLEGRA) 60 MG tablet Take 1 tablet (60 mg total) by mouth 2 (two) times daily. 12/25/13   Jeoffrey MassedPhilip H McGowen, MD  guaifenesin (HUMIBID E) 400 MG TABS tablet Take 1 tablet (400 mg total) by mouth every 8 (eight) hours. 07/31/13   Kelle DartingLayne C Weaver, NP  losartan-hydrochlorothiazide (HYZAAR) 50-12.5 MG per tablet Take 1 tablet by mouth daily.    Historical Provider, MD  meloxicam (MOBIC) 7.5 MG tablet Take 7.5 mg by mouth 2 (two) times daily as needed for pain.    Historical Provider, MD  Multiple  Vitamins-Minerals (MULTIVITAMIN & MINERAL PO) Take 1 tablet by mouth daily. [facility].    Historical Provider, MD  omeprazole (PRILOSEC) 40 MG capsule Take 1 capsule (40 mg total) by mouth daily. 08/08/12   Jeoffrey MassedPhilip H McGowen, MD  potassium chloride SA (K-DUR,KLOR-CON) 20 MEQ tablet Take 1 tablet (20 mEq total) by mouth daily. 01/05/13   Jeoffrey MassedPhilip H McGowen, MD  Probiotic Product (ALIGN) 4 MG CAPS 1cap PO at lunch daily for 1 month. 07/31/13   Kelle DartingLayne C Weaver, NP  Pseudoephedrine-DM-GG (ROBITUSSIN CF PO) Take by mouth as needed.    Historical Provider, MD  sertraline (ZOLOFT) 100 MG tablet Take 1 tablet (100 mg total) by mouth daily. 10/11/12   Jeoffrey MassedPhilip H McGowen, MD   BP 123/90 mmHg  Pulse 82  Temp(Src) 97.8  F (36.6 C) (Oral)  Resp 18  Ht 5\' 3"  (1.6 m)  Wt 169 lb (76.658 kg)  BMI 29.94 kg/m2  SpO2 100% Physical Exam CONSTITUTIONAL: elderly, frail HEAD: small abrasion to left upper forehead.  No other signs of trauma EYES: EOMI/PERRL ENMT: Mucous membranes moist NECK: supple no meningeal signs SPINE/BACK:entire spine nontender, No bruising/crepitance/stepoffs noted to spine CV: S1/S2 noted, no murmurs/rubs/gallops noted LUNGS: Lungs are clear to auscultation bilaterally, no apparent distress ABDOMEN: soft, nontender, no rebound or guarding, bowel sounds noted throughout abdomen GU:no cva tenderness NEURO: Pt is awake/alert/appropriate, moves all extremitiesx4.  No facial droop. She is pleasantly confused  EXTREMITIES: pulses normal/equal, full ROM, All extremities/joints palpated/ranged and nontender. No deformity to lower extremities SKIN: warm, color normal PSYCH: no abnormalities of mood noted, alert and oriented to situation  ED Course  Procedures   Imaging Review Ct Head Wo Contrast  01/05/2014   CLINICAL DATA:  Initial valuation for acute trauma.  Fall.  EXAM: CT HEAD WITHOUT CONTRAST  TECHNIQUE: Contiguous axial images were obtained from the base of the skull through the vertex without intravenous contrast.  COMPARISON:  Prior CT from 07/08/2013.  FINDINGS: Generalized cerebral atrophy is stable from prior study. No acute intracranial infarct or hemorrhage identified. No extra-axial fluid collection. No mass lesion or midline shift. Ventricular prominence related to global atrophy without hydrocephalus is present.  Scalp soft tissues within normal limits. Calvarium intact. No acute abnormality seen about either orbit.  Moderate right maxillary sinus disease present. Paranasal sinuses are otherwise clear. No mastoid effusion.  Visualized soft tissues of the neck are grossly normal. Degenerative changes noted within the visualized upper cervical spine.  IMPRESSION: 1. No acute  intracranial process. 2. Generalized age-related cerebral atrophy. 3. Moderate right maxillary sinus disease.   Electronically Signed   By: Rise MuBenjamin  McClintock M.D.   On: 01/05/2014 05:39      Medications  Tdap (BOOSTRIX) injection 0.5 mL (0.5 mLs Intramuscular Given 01/05/14 0449)    MDM  4:53 AM Pt here from nursing facility s/p fall from bed with evidence of head injury CT head ordered She does not appear to have any other signs of trauma She is not on anticoagulants 6:15 AM Pt is resting comfortably CT head negative No other acute traumatic injury noted Pt has already ambulated in the ED without issue I feel she is appropriate d/c back to nursing facility  Final diagnoses:  Head injury, acute, initial encounter  Abrasion of forehead, initial encounter    Nursing notes including past medical history and social history reviewed and considered in documentation Previous records reviewed and considered CT imaging reviewed by myself and considered during evaluation  Joya Gaskins, MD 01/05/14 [phone]

## 2014-01-05 NOTE — Discharge Instructions (Signed)

## 2014-01-05 NOTE — ED Notes (Signed)
[facility] [address] [place] [address]  [facility] to arrange transport back.

## 2014-01-05 NOTE — ED Notes (Signed)
Per EMS, [facility]  Patient fell out of bed at 0325 and by the time staff got to her room, she was already back in bed and wanted to go back to sleep.  EMS states the only reason she was sent is because she has an abrasion to her forehead.

## 2014-01-05 NOTE — ED Notes (Signed)
Pt resting quietly.  No distress noted.  

## 2014-01-05 NOTE — ED Notes (Signed)
Pt previously ambulated to [facility] with assistance.

## 2014-02-09 ENCOUNTER — Emergency Department (HOSPITAL_COMMUNITY): Payer: Medicare HMO

## 2014-02-09 ENCOUNTER — Emergency Department (HOSPITAL_COMMUNITY)
Admission: EM | Admit: 2014-02-09 | Discharge: 2014-02-09 | Disposition: A | Discharge status: Home / Self Care | Payer: Medicare HMO | Attending: Emergency Medicine | Admitting: Emergency Medicine

## 2014-02-09 ENCOUNTER — Encounter (HOSPITAL_COMMUNITY): Payer: Self-pay | Admitting: Emergency Medicine

## 2014-02-09 DIAGNOSIS — S0990XA Unspecified injury of head, initial encounter: Secondary | ICD-10-CM | POA: Insufficient documentation

## 2014-02-09 DIAGNOSIS — F0390 Unspecified dementia without behavioral disturbance: Secondary | ICD-10-CM | POA: Diagnosis not present

## 2014-02-09 DIAGNOSIS — Z9889 Other specified postprocedural states: Secondary | ICD-10-CM | POA: Diagnosis not present

## 2014-02-09 DIAGNOSIS — W19XXXA Unspecified fall, initial encounter: Secondary | ICD-10-CM

## 2014-02-09 DIAGNOSIS — Y92128 Other place in nursing home as the place of occurrence of the external cause: Secondary | ICD-10-CM | POA: Diagnosis not present

## 2014-02-09 DIAGNOSIS — Z791 Long term (current) use of non-steroidal anti-inflammatories (NSAID): Secondary | ICD-10-CM | POA: Diagnosis not present

## 2014-02-09 DIAGNOSIS — M1990 Unspecified osteoarthritis, unspecified site: Secondary | ICD-10-CM | POA: Diagnosis not present

## 2014-02-09 DIAGNOSIS — Y9389 Activity, other specified: Secondary | ICD-10-CM | POA: Diagnosis not present

## 2014-02-09 DIAGNOSIS — Z8669 Personal history of other diseases of the nervous system and sense organs: Secondary | ICD-10-CM | POA: Insufficient documentation

## 2014-02-09 DIAGNOSIS — W07XXXA Fall from chair, initial encounter: Secondary | ICD-10-CM | POA: Insufficient documentation

## 2014-02-09 DIAGNOSIS — Y998 Other external cause status: Secondary | ICD-10-CM | POA: Diagnosis not present

## 2014-02-09 DIAGNOSIS — Z7982 Long term (current) use of aspirin: Secondary | ICD-10-CM | POA: Diagnosis not present

## 2014-02-09 DIAGNOSIS — N183 Chronic kidney disease, stage 3 (moderate): Secondary | ICD-10-CM | POA: Insufficient documentation

## 2014-02-09 DIAGNOSIS — Z79899 Other long term (current) drug therapy: Secondary | ICD-10-CM | POA: Diagnosis not present

## 2014-02-09 DIAGNOSIS — I129 Hypertensive chronic kidney disease with stage 1 through stage 4 chronic kidney disease, or unspecified chronic kidney disease: Secondary | ICD-10-CM | POA: Diagnosis not present

## 2014-02-09 DIAGNOSIS — F329 Major depressive disorder, single episode, unspecified: Secondary | ICD-10-CM | POA: Insufficient documentation

## 2014-02-09 NOTE — Discharge Instructions (Signed)
Fall Prevention and Home Safety There is no evidence of serious injury from your fall. Follow up with your doctor. Return to the ED if you develop new or worsening symptoms. Falls cause injuries and can affect all age groups. It is possible to use preventive measures to significantly decrease the likelihood of falls. There are many simple measures which can make your home safer and prevent falls. OUTDOORS  Repair cracks and edges of walkways and driveways.  Remove high doorway thresholds.  Trim shrubbery on the main path into your home.  Have good outside lighting.  Clear walkways of tools, rocks, debris, and clutter.  Check that handrails are not broken and are securely fastened. Both sides of steps should have handrails.  Have leaves, snow, and ice cleared regularly.  Use sand or salt on walkways during winter months.  In the garage, clean up grease or oil spills. BATHROOM  Install night lights.  Install grab bars by the toilet and in the tub and shower.  Use non-skid mats or decals in the tub or shower.  Place a plastic non-slip stool in the shower to sit on, if needed.  Keep floors dry and clean up all water on the floor immediately.  Remove soap buildup in the tub or shower on a regular basis.  Secure bath mats with non-slip, double-sided rug tape.  Remove throw rugs and tripping hazards from the floors. BEDROOMS  Install night lights.  Make sure a bedside light is easy to reach.  Do not use oversized bedding.  Keep a telephone by your bedside.  Have a firm chair with side arms to use for getting dressed.  Remove throw rugs and tripping hazards from the floor. KITCHEN  Keep handles on pots and pans turned toward the center of the stove. Use back burners when possible.  Clean up spills quickly and allow time for drying.  Avoid walking on wet floors.  Avoid hot utensils and knives.  Position shelves so they are not too high or low.  Place commonly  used objects within easy reach.  If necessary, use a sturdy step stool with a grab bar when reaching.  Keep electrical cables out of the way.  Do not use floor polish or wax that makes floors slippery. If you must use wax, use non-skid floor wax.  Remove throw rugs and tripping hazards from the floor. STAIRWAYS  Never leave objects on stairs.  Place handrails on both sides of stairways and use them. Fix any loose handrails. Make sure handrails on both sides of the stairways are as long as the stairs.  Check carpeting to make sure it is firmly attached along stairs. Make repairs to worn or loose carpet promptly.  Avoid placing throw rugs at the top or bottom of stairways, or properly secure the rug with carpet tape to prevent slippage. Get rid of throw rugs, if possible.  Have an electrician put in a light switch at the top and bottom of the stairs. OTHER FALL PREVENTION TIPS  Wear low-heel or rubber-soled shoes that are supportive and fit well. Wear closed toe shoes.  When using a stepladder, make sure it is fully opened and both spreaders are firmly locked. Do not climb a closed stepladder.  Add color or contrast paint or tape to grab bars and handrails in your home. Place contrasting color strips on first and last steps.  Learn and use mobility aids as needed. Install an electrical emergency response system.  Turn on lights to avoid dark  areas. Replace light bulbs that burn out immediately. Get light switches that glow.  Arrange furniture to create clear pathways. Keep furniture in the same place.  Firmly attach carpet with non-skid or double-sided tape.  Eliminate uneven floor surfaces.  Select a carpet pattern that does not visually hide the edge of steps.  Be aware of all pets. OTHER HOME SAFETY TIPS  Set the water temperature for 120 F (48.8 C).  Keep emergency numbers on or near the telephone.  Keep smoke detectors on every level of the home and near sleeping  areas. Document Released: 01/22/2002 Document Revised: 08/03/2011 Document Reviewed: 04/23/2011 [facility] Patient Information 2015 [place], [place]. This information is not intended to replace advice given to you by your health care provider. Make sure you discuss any questions you have with your health care provider.

## 2014-02-09 NOTE — ED Notes (Signed)
Ambulated patient from bedside in room 3 to the side of nurses station near room two. [facility]. [facility]. Son to [facility].

## 2014-02-09 NOTE — ED Provider Notes (Signed)
CSN: [id number]     Arrival date & time 02/09/14  1836 History  This chart was scribed for Glynn OctaveStephen Almetta Liddicoat, MD by Roxy Cedarhandni Bhalodia, ED Scribe. This patient was seen in room APA03/APA03 and the patient's care was started at 6:56 PM.   Chief Complaint  Patient presents with  . Fall   Patient is a 80 y.o. female presenting with fall. The history is provided by the patient. No language interpreter was used.  Fall   LEVEL 5 CAVEAT: DEMENTIA AND CONFUSION.  HPI Comments: Laura Savage is a 80 y.o. female with a history of dementia, alzheimer's disease, hypertension, depression, osteoarthritis, insomnia, LBBB, and chronic renal insufficiency stage III, who presents to the Emergency Department complaining of a fall that occurred earlier today. Per assisted living staff, patient was sitting in a chair and fell over. Patient uses a wheelchair. Patient currently denies chest pain, abdominal pain, or shortness of breath. She reports a headache. She denies head injury. Per son, patient's current mental status is consistent with baseline. Patient is disoriented to place.  Past Medical History  Diagnosis Date  . Hypertension   . Dementia     CT brain 05/2013: chronic atrophy and small vessel ischemic changes.  . Depression   . Osteoarthritis   . Chronic renal insufficiency, stage III (moderate)     2013 renal u/s: medical renal dz  . Insomnia   . LBBB (left bundle branch block) 07/2006   Past Surgical History  Procedure Laterality Date  . Cardiovascular stress test  07/2006    No significant obstructive dz, normal LV EF but dyssynergic LV wall contraction c/w LBBB.  . Cardiac catheterization  07/2006    Aneurismal dilatation of coronaries, no significant astherosclerotic dz, normal LV function  . Vaginal hysterectomy  08/2002  . Rectocele repair  08/2002    with cystourethrocele repair at the same time   No family history on file. History  Substance Use Topics  . Smoking status: Never Smoker   .  Smokeless tobacco: Not on file  . Alcohol Use: No   OB History    No data available     Review of Systems  Unable to perform ROS: Dementia   Level 5 Caveat due to Dementia and Confusion.   Allergies  Review of patient's allergies indicates no known allergies.  Home Medications   Prior to Admission medications   Medication Sig Start Date End Date Taking? Authorizing Provider  acetaminophen (TYLENOL) 500 MG tablet Take 500 mg by mouth every 6 (six) hours as needed.    Historical Provider, MD  albuterol (PROVENTIL HFA;VENTOLIN HFA) 108 (90 BASE) MCG/ACT inhaler Inhale 2 puffs into the lungs 2 (two) times daily. 07/31/13   Kelle DartingLayne C Weaver, NP  ALPRAZolam Prudy Feeler(XANAX) 1 MG tablet 1/2 tab po q 12 NOON and 1 tab po qhs 06/13/13   Jeoffrey MassedPhilip H McGowen, MD  aspirin 81 MG tablet Take 81 mg by mouth daily.    Historical Provider, MD  donepezil (ARICEPT) 5 MG tablet 1 tab po qhs 12/28/12   Jeoffrey MassedPhilip H McGowen, MD  fexofenadine (ALLEGRA) 60 MG tablet Take 1 tablet (60 mg total) by mouth 2 (two) times daily. 12/25/13   Jeoffrey MassedPhilip H McGowen, MD  guaifenesin (HUMIBID E) 400 MG TABS tablet Take 1 tablet (400 mg total) by mouth every 8 (eight) hours. 07/31/13   Kelle DartingLayne C Weaver, NP  losartan-hydrochlorothiazide (HYZAAR) 50-12.5 MG per tablet Take 1 tablet by mouth daily.    Historical Provider, MD  meloxicam (MOBIC) 7.5 MG tablet Take 7.5 mg by mouth 2 (two) times daily as needed for pain.    Historical Provider, MD  Multiple Vitamins-Minerals (MULTIVITAMIN & MINERAL PO) Take 1 tablet by mouth daily. [facility].    Historical Provider, MD  omeprazole (PRILOSEC) 40 MG capsule Take 1 capsule (40 mg total) by mouth daily. 08/08/12   Jeoffrey MassedPhilip H McGowen, MD  potassium chloride SA (K-DUR,KLOR-CON) 20 MEQ tablet Take 1 tablet (20 mEq total) by mouth daily. 01/05/13   Jeoffrey MassedPhilip H McGowen, MD  Probiotic Product (ALIGN) 4 MG CAPS 1cap PO at lunch daily for 1 month. 07/31/13   Kelle DartingLayne C Weaver, NP  Pseudoephedrine-DM-GG (ROBITUSSIN CF PO)  Take by mouth as needed.    Historical Provider, MD  sertraline (ZOLOFT) 100 MG tablet Take 1 tablet (100 mg total) by mouth daily. 10/11/12   Jeoffrey MassedPhilip H McGowen, MD   Triage Vitals: BP 144/75 mmHg  Pulse 63  Temp(Src) 97.8 F (36.6 C) (Oral)  Resp 17  Wt 169 lb (76.658 kg)  SpO2 100%  Physical Exam  Constitutional: She appears well-developed and well-nourished. No distress.  HENT:  Head: Normocephalic and atraumatic.  Mouth/Throat: Oropharynx is clear and moist. No oropharyngeal exudate.  Eyes: Conjunctivae and EOM are normal. Pupils are equal, round, and reactive to light.  Neck: Normal range of motion. Neck supple.  No meningismus.  Cardiovascular: Normal rate, regular rhythm, normal heart sounds and intact distal pulses.   No murmur heard. Pulmonary/Chest: Effort normal and breath sounds normal. No respiratory distress.  Abdominal: Soft. There is no tenderness. There is no rebound and no guarding.  Musculoskeletal: Normal range of motion. She exhibits no edema or tenderness.  No c-spine tenderness noted. Full ROM of bilateral hips and knees without pain.  Neurological:  Oriented x1. Moving all extremities.  Skin: Skin is warm.  Psychiatric: She has a normal mood and affect. Her behavior is normal.  Nursing note and vitals reviewed.  ED Course  Procedures (including critical care time)  DIAGNOSTIC STUDIES: Oxygen Saturation is 100% on RA, normal by my interpretation.    COORDINATION OF CARE: 7:01 PM- Discussed plans to order diagnostic imaging of chest, head, cervical spine, pelvis and L/R knees. Pt advised of plan for treatment and pt agrees.  8:36 PM - Patient was able to ambulate with assistance. This is consistent with baseline. Discussed plans to discharge patient.   Labs Review Labs Reviewed - No data to display  Imaging Review Dg Chest 2 View  02/09/2014   CLINICAL DATA:  Patient fell today with chest pain, initial encounter  EXAM: CHEST  2 VIEW  COMPARISON:   02/12/2013  FINDINGS: Cardiac shadow is mildly enlarged. A calcified granuloma is noted in the left lung base. The lungs are otherwise clear. Degenerative changes of the thoracic spine are noted.  IMPRESSION: Prior granulomatous disease.  No acute abnormality is noted.   Electronically Signed   By: Alcide CleverMark  Lukens M.D.   On: 02/09/2014 20:03   Dg Pelvis 1-2 Views  02/09/2014   CLINICAL DATA:  Larey SeatFell from wheelchair this afternoon, initial encounter  EXAM: PELVIS - 1-2 VIEW  COMPARISON:  None.  FINDINGS: Mild degenerative changes of the hip joints are noted bilaterally. No acute fracture or dislocation is seen. No soft tissue changes are noted.  IMPRESSION: No acute abnormality noted.   Electronically Signed   By: Alcide CleverMark  Lukens M.D.   On: 02/09/2014 20:04   Ct Head Wo Contrast  02/09/2014   CLINICAL  DATA:  Fall from wheelchair, initial encounter  EXAM: CT HEAD WITHOUT CONTRAST  CT CERVICAL SPINE WITHOUT CONTRAST  TECHNIQUE: Multidetector CT imaging of the head and cervical spine was performed following the standard protocol without intravenous contrast. Multiplanar CT image reconstructions of the cervical spine were also generated.  COMPARISON:  01/05/2014  FINDINGS: CT HEAD FINDINGS  Mucosal thickening in the right maxillary antrum is noted which is chronic in nature. The bony calvarium is intact. Diffuse atrophic changes are noted. No findings to suggest acute hemorrhage, acute infarction or space-occupying mass lesion are noted.  CT CERVICAL SPINE FINDINGS  Seven cervical segments are well visualized. Vertebral body height is well maintained. Multilevel osteophytic changes and facet hypertrophic changes are seen. No acute fracture or acute facet abnormality is noted. The surrounding soft tissues and visualized lung apices are within normal limits. Generalized osteopenia is noted.  IMPRESSION: CT of the head: Chronic atrophic changes without acute abnormality.  CT of the cervical spine: Multilevel degenerative  change without acute abnormality.   Electronically Signed   By: Alcide Clever M.D.   On: 02/09/2014 20:18   Ct Cervical Spine Wo Contrast  02/09/2014   CLINICAL DATA:  Fall from wheelchair, initial encounter  EXAM: CT HEAD WITHOUT CONTRAST  CT CERVICAL SPINE WITHOUT CONTRAST  TECHNIQUE: Multidetector CT imaging of the head and cervical spine was performed following the standard protocol without intravenous contrast. Multiplanar CT image reconstructions of the cervical spine were also generated.  COMPARISON:  01/05/2014  FINDINGS: CT HEAD FINDINGS  Mucosal thickening in the right maxillary antrum is noted which is chronic in nature. The bony calvarium is intact. Diffuse atrophic changes are noted. No findings to suggest acute hemorrhage, acute infarction or space-occupying mass lesion are noted.  CT CERVICAL SPINE FINDINGS  Seven cervical segments are well visualized. Vertebral body height is well maintained. Multilevel osteophytic changes and facet hypertrophic changes are seen. No acute fracture or acute facet abnormality is noted. The surrounding soft tissues and visualized lung apices are within normal limits. Generalized osteopenia is noted.  IMPRESSION: CT of the head: Chronic atrophic changes without acute abnormality.  CT of the cervical spine: Multilevel degenerative change without acute abnormality.   Electronically Signed   By: Alcide Clever M.D.   On: 02/09/2014 20:18   Dg Knee Complete 4 Views Left  02/09/2014   CLINICAL DATA:  Fall from wheelchair with knee pain, initial encounter  EXAM: LEFT KNEE - COMPLETE 4+ VIEW  COMPARISON:  None.  FINDINGS: There are changes consistent with a prior knee prosthesis on the left. No acute fracture or dislocation is noted. No definitive loosening is seen.  IMPRESSION: No acute abnormality is noted.  Prior knee prosthesis is seen.   Electronically Signed   By: Alcide Clever M.D.   On: 02/09/2014 20:05   Dg Knee Complete 4 Views Right  02/09/2014   CLINICAL  DATA:  Fall from wheelchair with knee pain, initial encounter  EXAM: RIGHT KNEE - COMPLETE 4+ VIEW  COMPARISON:  None.  FINDINGS: Degenerative changes are noted with narrowing of the medial joint space and osteophytic change. No acute effusion is seen. No fracture or dislocation is noted. Patellofemoral degenerative changes are noted as well.  IMPRESSION: Chronic changes without acute abnormality.   Electronically Signed   By: Alcide Clever M.D.   On: 02/09/2014 20:06     EKG Interpretation None     MDM   Final diagnoses:  Fall   From nursing home after  mechanical fall from chair. No loss of consciousness. Dementia at baseline. Moving all extremities.  Baseline mental status per family at bedside.  Head and C-spine negative. X-rays negative for acute traumatic pathology. Patient able to ambulate at her baseline per family. She is at her baseline mental status.  No evidence of serious traumatic injury from fall. Stable to return to the facility.  BP 144/75 mmHg  Pulse 63  Temp(Src) 97.8 F (36.6 C) (Oral)  Resp 17  Wt 169 lb (76.658 kg)  SpO2 100%    I personally performed the services described in this documentation, which was scribed in my presence. The recorded information has been reviewed and is accurate.  Glynn Octave, MD 02/10/14 [phone]

## 2014-02-09 NOTE — ED Notes (Signed)
EMS called to pt's memory care assisted living for a fall. Reported that pt did not loss consciousness and is at "her normal" for behavior. EMS reports seeing no deformities and states pt is tender to touch on both hips. Pt is smiling and happy.

## 2014-02-11 ENCOUNTER — Ambulatory Visit: Payer: Medicare HMO | Admitting: Family Medicine

## 2014-02-13 ENCOUNTER — Ambulatory Visit (INDEPENDENT_AMBULATORY_CARE_PROVIDER_SITE_OTHER): Payer: Medicare HMO | Admitting: Family Medicine

## 2014-02-13 ENCOUNTER — Encounter: Payer: Self-pay | Admitting: Family Medicine

## 2014-02-13 ENCOUNTER — Ambulatory Visit: Payer: Medicare HMO | Admitting: Family Medicine

## 2014-02-13 VITALS — BP 129/76 | HR 84 | Temp 97.8°F | Resp 18 | Wt 175.0 lb

## 2014-02-13 DIAGNOSIS — N189 Chronic kidney disease, unspecified: Secondary | ICD-10-CM

## 2014-02-13 DIAGNOSIS — F0391 Unspecified dementia with behavioral disturbance: Secondary | ICD-10-CM

## 2014-02-13 DIAGNOSIS — N183 Chronic kidney disease, stage 3 unspecified: Secondary | ICD-10-CM

## 2014-02-13 DIAGNOSIS — I10 Essential (primary) hypertension: Secondary | ICD-10-CM

## 2014-02-13 DIAGNOSIS — R32 Unspecified urinary incontinence: Secondary | ICD-10-CM

## 2014-02-13 DIAGNOSIS — F03918 Unspecified dementia, unspecified severity, with other behavioral disturbance: Secondary | ICD-10-CM

## 2014-02-13 NOTE — Progress Notes (Signed)
Pre visit review using our clinic review tool, if applicable. No additional management support is needed unless otherwise documented below in the visit note. 

## 2014-02-13 NOTE — Progress Notes (Signed)
OFFICE NOTE  02/13/2014  CC:  Chief Complaint  Patient presents with  . Follow-up   HPI: Patient is a 80 y.o. Caucasian female who is here accompanied by her son for 4 mo f/u dementia, HTN, CRI stage III. Review of ALF records show weekly bp checks are in normal range. Has rare fall when she tries to get out of wheelchair and walk without her walker.  Had one recently and had to go to ED. All checked out fine.  She also has urinary incontinence and occ fecal incontinence and must wear pull-up diapers all the time and needs a form filled out today.  Pertinent PMH:  Past medical, surgical, social, and family history reviewed and no changes are noted since last office visit.  MEDS:  Outpatient Prescriptions Prior to Visit  Medication Sig Dispense Refill  . albuterol (PROVENTIL HFA;VENTOLIN HFA) 108 (90 BASE) MCG/ACT inhaler Inhale 2 puffs into the lungs 2 (two) times daily. 1 Inhaler 0  . ALPRAZolam (XANAX) 1 MG tablet 1/2 tab po q 12 NOON and 1 tab po qhs 45 tablet 5  . aspirin 81 MG tablet Take 81 mg by mouth daily.    Marland Kitchen. donepezil (ARICEPT) 5 MG tablet 1 tab po qhs 30 tablet 3  . fexofenadine (ALLEGRA) 60 MG tablet Take 1 tablet (60 mg total) by mouth 2 (two) times daily. 60 tablet 3  . guaifenesin (HUMIBID E) 400 MG TABS tablet Take 1 tablet (400 mg total) by mouth every 8 (eight) hours. 56 tablet 0  . losartan-hydrochlorothiazide (HYZAAR) 50-12.5 MG per tablet Take 1 tablet by mouth daily.    . meloxicam (MOBIC) 7.5 MG tablet Take 7.5 mg by mouth 2 (two) times daily as needed for pain.    . Multiple Vitamins-Minerals (MULTIVITAMIN & MINERAL PO) Take 1 tablet by mouth daily. [facility].    Marland Kitchen. omeprazole (PRILOSEC) 40 MG capsule Take 1 capsule (40 mg total) by mouth daily. 90 capsule 1  . potassium chloride SA (K-DUR,KLOR-CON) 20 MEQ tablet Take 1 tablet (20 mEq total) by mouth daily. 90 tablet 1  . sertraline (ZOLOFT) 100 MG tablet Take 1 tablet (100 mg total) by mouth daily. 90  tablet 1  . acetaminophen (TYLENOL) 500 MG tablet Take 500 mg by mouth every 6 (six) hours as needed.    . Probiotic Product (ALIGN) 4 MG CAPS 1cap PO at lunch daily for 1 month. (Patient not taking: Reported on 02/13/2014) 30 capsule 0  . Pseudoephedrine-DM-GG (ROBITUSSIN CF PO) Take by mouth as needed.     No facility-administered medications prior to visit.    PE: Blood pressure 129/76, pulse 84, temperature 97.8 F (36.6 C), temperature source Temporal, resp. rate 18, weight 175 lb (79.379 kg), SpO2 95 %. Gen: Alert, well appearing.  Oriented to person, place, situation. Interactive and cooperative.  Pleasantly demented. ZOX:WRUEENT:Eyes: no injection, icteris, swelling, or exudate.  EOMI, PERRLA. Mouth: lips without lesion/swelling.  Oral mucosa pink and moist. Oropharynx without erythema, exudate, or swelling.  CV: RRR, no m/r/g.   LUNGS: CTA bilat, nonlabored resps, good aeration in all lung fields. EXT: no clubbing, cyanosis, or edema.  Neuro: CN 2-12 intact bilaterally, strength 5/5 in proximal and distal upper extremities and lower extremities bilaterally.  No tremor. No pronator drift.  LABS:   Chemistry      Component Value Date/Time   NA 139 06/13/2013 1039   K 4.3 06/13/2013 1039   CL 103 06/13/2013 1039   CO2 30 06/13/2013 1039  BUN 22 06/13/2013 1039   CREATININE 1.5* 06/13/2013 1039      Component Value Date/Time   CALCIUM 10.7* 06/13/2013 1039      IMPRESSION AND PLAN:  1) HTN; The current medical regimen is effective;  continue present plan and medications.  2) Dementia with behavioral disturbance: unfortunately she is ignoring recommendations to ask for assistance with ambulation--had fall recently that fortunately led to no significant injury.  3) CRI, stage III; check BMET next f/u in 4-6 mo.  4) Urinary incontinence: needs diapers, form filled out for this today.  An After Visit Summary was printed and given to the patient.  FOLLOW UP: 4-772mo

## 2014-02-15 DIAGNOSIS — G309 Alzheimer's disease, unspecified: Secondary | ICD-10-CM | POA: Diagnosis not present

## 2014-02-16 DIAGNOSIS — G309 Alzheimer's disease, unspecified: Secondary | ICD-10-CM | POA: Diagnosis not present

## 2014-02-17 DIAGNOSIS — G309 Alzheimer's disease, unspecified: Secondary | ICD-10-CM | POA: Diagnosis not present

## 2014-02-18 DIAGNOSIS — G309 Alzheimer's disease, unspecified: Secondary | ICD-10-CM | POA: Diagnosis not present

## 2014-02-19 DIAGNOSIS — G309 Alzheimer's disease, unspecified: Secondary | ICD-10-CM | POA: Diagnosis not present

## 2014-02-20 DIAGNOSIS — G309 Alzheimer's disease, unspecified: Secondary | ICD-10-CM | POA: Diagnosis not present

## 2014-02-21 DIAGNOSIS — G309 Alzheimer's disease, unspecified: Secondary | ICD-10-CM | POA: Diagnosis not present

## 2014-02-22 ENCOUNTER — Encounter (HOSPITAL_COMMUNITY): Payer: Self-pay

## 2014-02-22 ENCOUNTER — Inpatient Hospital Stay (HOSPITAL_COMMUNITY)
Admission: EM | Admit: 2014-02-22 | Discharge: 2014-02-26 | DRG: 481 | Disposition: A | Discharge status: Skilled Nursing Facility | Payer: Commercial Managed Care - HMO | Attending: Internal Medicine | Admitting: Internal Medicine

## 2014-02-22 ENCOUNTER — Emergency Department (HOSPITAL_COMMUNITY): Payer: Commercial Managed Care - HMO

## 2014-02-22 DIAGNOSIS — Z7982 Long term (current) use of aspirin: Secondary | ICD-10-CM

## 2014-02-22 DIAGNOSIS — F419 Anxiety disorder, unspecified: Secondary | ICD-10-CM | POA: Diagnosis present

## 2014-02-22 DIAGNOSIS — Z79899 Other long term (current) drug therapy: Secondary | ICD-10-CM

## 2014-02-22 DIAGNOSIS — M1990 Unspecified osteoarthritis, unspecified site: Secondary | ICD-10-CM | POA: Diagnosis present

## 2014-02-22 DIAGNOSIS — R739 Hyperglycemia, unspecified: Secondary | ICD-10-CM | POA: Diagnosis present

## 2014-02-22 DIAGNOSIS — F329 Major depressive disorder, single episode, unspecified: Secondary | ICD-10-CM | POA: Diagnosis not present

## 2014-02-22 DIAGNOSIS — W19XXXD Unspecified fall, subsequent encounter: Secondary | ICD-10-CM | POA: Diagnosis not present

## 2014-02-22 DIAGNOSIS — S72141A Displaced intertrochanteric fracture of right femur, initial encounter for closed fracture: Principal | ICD-10-CM | POA: Diagnosis present

## 2014-02-22 DIAGNOSIS — D62 Acute posthemorrhagic anemia: Secondary | ICD-10-CM | POA: Diagnosis not present

## 2014-02-22 DIAGNOSIS — I129 Hypertensive chronic kidney disease with stage 1 through stage 4 chronic kidney disease, or unspecified chronic kidney disease: Secondary | ICD-10-CM | POA: Diagnosis present

## 2014-02-22 DIAGNOSIS — N183 Chronic kidney disease, stage 3 unspecified: Secondary | ICD-10-CM | POA: Diagnosis present

## 2014-02-22 DIAGNOSIS — F0391 Unspecified dementia with behavioral disturbance: Secondary | ICD-10-CM | POA: Diagnosis present

## 2014-02-22 DIAGNOSIS — D696 Thrombocytopenia, unspecified: Secondary | ICD-10-CM | POA: Diagnosis not present

## 2014-02-22 DIAGNOSIS — F0390 Unspecified dementia without behavioral disturbance: Secondary | ICD-10-CM | POA: Diagnosis present

## 2014-02-22 DIAGNOSIS — R1310 Dysphagia, unspecified: Secondary | ICD-10-CM

## 2014-02-22 DIAGNOSIS — R52 Pain, unspecified: Secondary | ICD-10-CM | POA: Diagnosis not present

## 2014-02-22 DIAGNOSIS — S72001S Fracture of unspecified part of neck of right femur, sequela: Secondary | ICD-10-CM | POA: Diagnosis not present

## 2014-02-22 DIAGNOSIS — S72009A Fracture of unspecified part of neck of unspecified femur, initial encounter for closed fracture: Secondary | ICD-10-CM | POA: Insufficient documentation

## 2014-02-22 DIAGNOSIS — W050XXA Fall from non-moving wheelchair, initial encounter: Secondary | ICD-10-CM | POA: Diagnosis present

## 2014-02-22 DIAGNOSIS — R509 Fever, unspecified: Secondary | ICD-10-CM

## 2014-02-22 DIAGNOSIS — Y92129 Unspecified place in nursing home as the place of occurrence of the external cause: Secondary | ICD-10-CM | POA: Diagnosis not present

## 2014-02-22 DIAGNOSIS — N189 Chronic kidney disease, unspecified: Secondary | ICD-10-CM

## 2014-02-22 DIAGNOSIS — Z6829 Body mass index (BMI) 29.0-29.9, adult: Secondary | ICD-10-CM | POA: Diagnosis not present

## 2014-02-22 DIAGNOSIS — K219 Gastro-esophageal reflux disease without esophagitis: Secondary | ICD-10-CM | POA: Diagnosis present

## 2014-02-22 DIAGNOSIS — I10 Essential (primary) hypertension: Secondary | ICD-10-CM | POA: Diagnosis present

## 2014-02-22 DIAGNOSIS — S199XXA Unspecified injury of neck, initial encounter: Secondary | ICD-10-CM | POA: Diagnosis not present

## 2014-02-22 DIAGNOSIS — S72001A Fracture of unspecified part of neck of right femur, initial encounter for closed fracture: Secondary | ICD-10-CM

## 2014-02-22 DIAGNOSIS — S0990XA Unspecified injury of head, initial encounter: Secondary | ICD-10-CM | POA: Diagnosis not present

## 2014-02-22 DIAGNOSIS — M79604 Pain in right leg: Secondary | ICD-10-CM | POA: Diagnosis not present

## 2014-02-22 DIAGNOSIS — R404 Transient alteration of awareness: Secondary | ICD-10-CM | POA: Diagnosis not present

## 2014-02-22 DIAGNOSIS — W19XXXA Unspecified fall, initial encounter: Secondary | ICD-10-CM | POA: Diagnosis present

## 2014-02-22 DIAGNOSIS — N2889 Other specified disorders of kidney and ureter: Secondary | ICD-10-CM | POA: Diagnosis present

## 2014-02-22 DIAGNOSIS — Z7401 Bed confinement status: Secondary | ICD-10-CM | POA: Diagnosis not present

## 2014-02-22 DIAGNOSIS — M25551 Pain in right hip: Secondary | ICD-10-CM | POA: Diagnosis present

## 2014-02-22 DIAGNOSIS — J989 Respiratory disorder, unspecified: Secondary | ICD-10-CM

## 2014-02-22 DIAGNOSIS — R51 Headache: Secondary | ICD-10-CM | POA: Diagnosis not present

## 2014-02-22 HISTORY — DX: Displaced intertrochanteric fracture of right femur, initial encounter for closed fracture: S72.141A

## 2014-02-22 HISTORY — DX: Thrombocytopenia, unspecified: D69.6

## 2014-02-22 LAB — CBC WITH DIFFERENTIAL/PLATELET
BASOS ABS: 0 10*3/uL (ref 0.0–0.1)
Basophils Relative: 0 % (ref 0–1)
EOS ABS: 0.2 10*3/uL (ref 0.0–0.7)
Eosinophils Relative: 2 % (ref 0–5)
HEMATOCRIT: 40.5 % (ref 36.0–46.0)
Hemoglobin: 13 g/dL (ref 12.0–15.0)
Lymphocytes Relative: 20 % (ref 12–46)
Lymphs Abs: 2 10*3/uL (ref 0.7–4.0)
MCH: 30.5 pg (ref 26.0–34.0)
MCHC: 32.1 g/dL (ref 30.0–36.0)
MCV: 95.1 fL (ref 78.0–100.0)
Monocytes Absolute: 0.5 10*3/uL (ref 0.1–1.0)
Monocytes Relative: 5 % (ref 3–12)
NEUTROS PCT: 73 % (ref 43–77)
Neutro Abs: 7.5 10*3/uL (ref 1.7–7.7)
Platelets: 148 10*3/uL — ABNORMAL LOW (ref 150–400)
RBC: 4.26 MIL/uL (ref 3.87–5.11)
RDW: 13.6 % (ref 11.5–15.5)
WBC: 10.2 10*3/uL (ref 4.0–10.5)

## 2014-02-22 LAB — URINALYSIS, ROUTINE W REFLEX MICROSCOPIC
Bilirubin Urine: NEGATIVE
Glucose, UA: NEGATIVE mg/dL
KETONES UR: NEGATIVE mg/dL
Leukocytes, UA: NEGATIVE
NITRITE: NEGATIVE
PH: 6 (ref 5.0–8.0)
Protein, ur: NEGATIVE mg/dL
SPECIFIC GRAVITY, URINE: 1.02 (ref 1.005–1.030)
UROBILINOGEN UA: 0.2 mg/dL (ref 0.0–1.0)

## 2014-02-22 LAB — BASIC METABOLIC PANEL
Anion gap: 7 (ref 5–15)
BUN: 24 mg/dL — ABNORMAL HIGH (ref 6–23)
CO2: 27 mmol/L (ref 19–32)
CREATININE: 1.47 mg/dL — AB (ref 0.50–1.10)
Calcium: 9.2 mg/dL (ref 8.4–10.5)
Chloride: 106 mEq/L (ref 96–112)
GFR calc Af Amer: 37 mL/min — ABNORMAL LOW (ref >90)
GFR calc non Af Amer: 32 mL/min — ABNORMAL LOW (ref >90)
Glucose, Bld: 122 mg/dL — ABNORMAL HIGH (ref 70–99)
POTASSIUM: 3.5 mmol/L (ref 3.5–5.1)
Sodium: 140 mmol/L (ref 135–145)

## 2014-02-22 LAB — URINE MICROSCOPIC-ADD ON

## 2014-02-22 MED ORDER — METHOCARBAMOL 1000 MG/10ML IJ SOLN
500.0000 mg | Freq: Four times a day (QID) | INTRAMUSCULAR | Status: DC | PRN
  Filled 2014-02-22: qty 5

## 2014-02-22 MED ORDER — POTASSIUM CHLORIDE 20 MEQ PO PACK
40.0000 meq | PACK | Freq: Once | ORAL | Status: AC
  Administered 2014-02-22: 40 meq via ORAL
  Filled 2014-02-22: qty 2

## 2014-02-22 MED ORDER — MORPHINE SULFATE 2 MG/ML IJ SOLN
0.5000 mg | INTRAMUSCULAR | Status: DC | PRN
  Administered 2014-02-22 – 2014-02-23 (×3): 0.5 mg via INTRAVENOUS
  Filled 2014-02-22 (×3): qty 1

## 2014-02-22 MED ORDER — LACTATED RINGERS IV SOLN
INTRAVENOUS | Status: DC
  Administered 2014-02-22: via INTRAVENOUS

## 2014-02-22 MED ORDER — METHOCARBAMOL 500 MG PO TABS: 500.0000 mg | ORAL_TABLET | Freq: Four times a day (QID) | ORAL | Status: DC | PRN

## 2014-02-22 MED ORDER — HYDROMORPHONE HCL 1 MG/ML IJ SOLN
0.5000 mg | Freq: Once | INTRAMUSCULAR | Status: AC
  Administered 2014-02-22: 0.5 mg via INTRAVENOUS
  Filled 2014-02-22: qty 1

## 2014-02-22 MED ORDER — ALBUTEROL SULFATE (2.5 MG/3ML) 0.083% IN NEBU
3.0000 mL | INHALATION_SOLUTION | Freq: Two times a day (BID) | RESPIRATORY_TRACT | Status: DC
  Administered 2014-02-23 – 2014-02-26 (×8): 3 mL via RESPIRATORY_TRACT
  Filled 2014-02-22 (×7): qty 3

## 2014-02-22 MED ORDER — HYDROCODONE-ACETAMINOPHEN 5-325 MG PO TABS: 1.0000 | ORAL_TABLET | Freq: Four times a day (QID) | ORAL | Status: DC | PRN

## 2014-02-22 NOTE — ED Provider Notes (Signed)
CSN: [id number]     Arrival date & time 02/22/14  1918 History   First MD Initiated Contact with Patient 02/22/14 1923     Chief Complaint  Patient presents with  . Fall     (Consider location/radiation/quality/duration/timing/severity/associated sxs/prior Treatment) Patient is a 81 y.o. female presenting with fall. The history is provided by the EMS personnel (the pt fell getting out of a wheel chair).  Fall This is a new problem. The current episode started 1 to 2 hours ago. The problem occurs rarely. The problem has not changed since onset.Pertinent negatives include no chest pain, no abdominal pain and no headaches. Nothing aggravates the symptoms. Nothing relieves the symptoms.    Past Medical History  Diagnosis Date  . Hypertension   . Dementia     CT brain 05/2013: chronic atrophy and small vessel ischemic changes.  . Depression   . Osteoarthritis   . Chronic renal insufficiency, stage III (moderate)     2013 renal u/s: medical renal dz  . Insomnia   . LBBB (left bundle branch block) 07/2006   Past Surgical History  Procedure Laterality Date  . Cardiovascular stress test  07/2006    No significant obstructive dz, normal LV EF but dyssynergic LV wall contraction c/w LBBB.  . Cardiac catheterization  07/2006    Aneurismal dilatation of coronaries, no significant astherosclerotic dz, normal LV function  . Vaginal hysterectomy  08/2002  . Rectocele repair  08/2002    with cystourethrocele repair at the same time   History reviewed. No pertinent family history. History  Substance Use Topics  . Smoking status: Never Smoker   . Smokeless tobacco: Not on file  . Alcohol Use: No   OB History    No data available     Review of Systems  Constitutional: Negative for appetite change and fatigue.  HENT: Negative for congestion, ear discharge and sinus pressure.   Eyes: Negative for discharge.  Respiratory: Negative for cough.   Cardiovascular: Negative for chest pain.   Gastrointestinal: Negative for abdominal pain and diarrhea.  Genitourinary: Negative for frequency and hematuria.  Musculoskeletal: Negative for back pain.       Hip pain  Skin: Negative for rash.  Neurological: Negative for seizures and headaches.  Psychiatric/Behavioral: Negative for hallucinations.      Allergies  Review of patient's allergies indicates no known allergies.  Home Medications   Prior to Admission medications   Medication Sig Start Date End Date Taking? Authorizing Provider  albuterol (PROVENTIL HFA;VENTOLIN HFA) 108 (90 BASE) MCG/ACT inhaler Inhale 2 puffs into the lungs 2 (two) times daily. 07/31/13  Yes Kelle Darting, NP  ALPRAZolam (XANAX) 1 MG tablet 1/2 tab po q 12 NOON and 1 tab po qhs Patient taking differently: Take 0.5-1 mg by mouth 2 (two) times daily. 1/2  12 NOON and 1 tab at bedtime 06/13/13  Yes Jeoffrey Massed, MD  aspirin EC 81 MG tablet Take 81 mg by mouth daily.   Yes Historical Provider, MD  donepezil (ARICEPT) 5 MG tablet 1 tab po qhs Patient taking differently: Take 5 mg by mouth at bedtime. 1 tab po qhs 12/28/12  Yes Jeoffrey Massed, MD  fexofenadine (ALLEGRA) 60 MG tablet Take 1 tablet (60 mg total) by mouth 2 (two) times daily. 12/25/13  Yes Jeoffrey Massed, MD  losartan-hydrochlorothiazide (HYZAAR) 50-12.5 MG per tablet Take 1 tablet by mouth daily.   Yes Historical Provider, MD  meloxicam (MOBIC) 7.5 MG tablet Take 7.5  mg by mouth 2 (two) times daily as needed for pain.   Yes Historical Provider, MD  Multiple Vitamin (MULTIVITAMIN WITH MINERALS) TABS tablet Take 1 tablet by mouth daily.   Yes Historical Provider, MD  omeprazole (PRILOSEC) 40 MG capsule Take 1 capsule (40 mg total) by mouth daily. 08/08/12  Yes Jeoffrey Massed, MD  potassium chloride SA (K-DUR,KLOR-CON) 20 MEQ tablet Take 1 tablet (20 mEq total) by mouth daily. 01/05/13  Yes Jeoffrey Massed, MD  sertraline (ZOLOFT) 100 MG tablet Take 1 tablet (100 mg total) by mouth daily.  10/11/12  Yes Jeoffrey Massed, MD  guaifenesin (HUMIBID E) 400 MG TABS tablet Take 1 tablet (400 mg total) by mouth every 8 (eight) hours. Patient taking differently: Take 400 mg by mouth every 8 (eight) hours as needed (for cough).  07/31/13   Kelle Darting, NP  Multiple Vitamins-Minerals (MULTIVITAMIN & MINERAL PO) Take 1 tablet by mouth daily. [facility].    Historical Provider, MD  Probiotic Product (ALIGN) 4 MG CAPS 1cap PO at lunch daily for 1 month. Patient not taking: Reported on 02/13/2014 07/31/13   Kelle Darting, NP   BP 142/103 mmHg  Pulse 73  Temp(Src) 97.8 F (36.6 C) (Oral)  Resp 20  SpO2 100% Physical Exam  Constitutional: She is oriented to person, place, and time. She appears well-developed.  HENT:  Head: Normocephalic.  Tender occipital head  Eyes: Conjunctivae and EOM are normal. No scleral icterus.  Neck: Neck supple. No thyromegaly present.  Cardiovascular: Normal rate and regular rhythm.  Exam reveals no gallop and no friction rub.   No murmur heard. Pulmonary/Chest: No stridor. She has no wheezes. She has no rales. She exhibits no tenderness.  Abdominal: She exhibits no distension. There is no tenderness. There is no rebound.  Musculoskeletal: She exhibits no edema.  Tender right hip  Lymphadenopathy:    She has no cervical adenopathy.  Neurological: She is oriented to person, place, and time. She exhibits normal muscle tone. Coordination normal.  Skin: No rash noted. No erythema.  Psychiatric: She has a normal mood and affect. Her behavior is normal.    ED Course  Procedures (including critical care time) Labs Review Labs Reviewed  CBC WITH DIFFERENTIAL - Abnormal; Notable for the following:    Platelets 148 (*)    All other components within normal limits  BASIC METABOLIC PANEL - Abnormal; Notable for the following:    Glucose, Bld 122 (*)    BUN 24 (*)    Creatinine, Ser 1.47 (*)    GFR calc non Af Amer 32 (*)    GFR calc Af Amer 37 (*)     All other components within normal limits  URINALYSIS, ROUTINE W REFLEX MICROSCOPIC    Imaging Review Ct Head Wo Contrast  02/22/2014   CLINICAL DATA:  81 year old female with history of trauma after falling onto a hard surface floor. Headache. Altered mental status.  EXAM: CT HEAD WITHOUT CONTRAST  CT CERVICAL SPINE WITHOUT CONTRAST  TECHNIQUE: Multidetector CT imaging of the head and cervical spine was performed following the standard protocol without intravenous contrast. Multiplanar CT image reconstructions of the cervical spine were also generated.  COMPARISON:  Head CT 02/09/2014. CT of the cervical spine 02/09/2014  FINDINGS: CT HEAD FINDINGS  Moderate cerebral and mild cerebellar atrophy. Patchy and confluent areas of decreased attenuation are noted throughout the deep and periventricular white matter of the cerebral hemispheres bilaterally, compatible with chronic microvascular ischemic disease.  Physiologic calcifications of the basal ganglia bilaterally (right greater than left). No acute displaced skull fractures are identified. No acute intracranial abnormality. Specifically, no evidence of acute post-traumatic intracranial hemorrhage, no definite regions of acute/subacute cerebral ischemia, no focal mass, mass effect, hydrocephalus or abnormal intra or extra-axial fluid collections. The visualized paranasal sinuses and mastoids are well pneumatized.  CT CERVICAL SPINE FINDINGS  Mild chronic compression of the anterior aspect of C6 (approximately 30% loss of height) is unchanged compared to prior examinations. No acute displaced cervical spine fracture identified. 3 mm of anterolisthesis of C6 upon C7 (unchanged). Alignment is otherwise anatomic. Prevertebral soft tissues are normal. Multilevel degenerative disc disease and multilevel facet arthropathy, similar to the prior examination. Visualized portions of the upper thorax are unremarkable.  IMPRESSION: 1. No evidence of significant acute  traumatic injury to the skull, brain or cervical spine. 2. Moderate cerebral and mild cerebellar atrophy with chronic microvascular ischemic changes throughout cerebral white matter redemonstrated, similar to prior examinations. 3. Multilevel degenerative disc disease, cervical spondylosis, and chronic compression of C6 (unchanged), similar to prior examinations.   Electronically Signed   By: Trudie Reedaniel  Entrikin M.D.   On: 02/22/2014 20:41   Ct Cervical Spine Wo Contrast  02/22/2014   CLINICAL DATA:  81 year old female with history of trauma after falling onto a hard surface floor. Headache. Altered mental status.  EXAM: CT HEAD WITHOUT CONTRAST  CT CERVICAL SPINE WITHOUT CONTRAST  TECHNIQUE: Multidetector CT imaging of the head and cervical spine was performed following the standard protocol without intravenous contrast. Multiplanar CT image reconstructions of the cervical spine were also generated.  COMPARISON:  Head CT 02/09/2014. CT of the cervical spine 02/09/2014  FINDINGS: CT HEAD FINDINGS  Moderate cerebral and mild cerebellar atrophy. Patchy and confluent areas of decreased attenuation are noted throughout the deep and periventricular white matter of the cerebral hemispheres bilaterally, compatible with chronic microvascular ischemic disease. Physiologic calcifications of the basal ganglia bilaterally (right greater than left). No acute displaced skull fractures are identified. No acute intracranial abnormality. Specifically, no evidence of acute post-traumatic intracranial hemorrhage, no definite regions of acute/subacute cerebral ischemia, no focal mass, mass effect, hydrocephalus or abnormal intra or extra-axial fluid collections. The visualized paranasal sinuses and mastoids are well pneumatized.  CT CERVICAL SPINE FINDINGS  Mild chronic compression of the anterior aspect of C6 (approximately 30% loss of height) is unchanged compared to prior examinations. No acute displaced cervical spine fracture  identified. 3 mm of anterolisthesis of C6 upon C7 (unchanged). Alignment is otherwise anatomic. Prevertebral soft tissues are normal. Multilevel degenerative disc disease and multilevel facet arthropathy, similar to the prior examination. Visualized portions of the upper thorax are unremarkable.  IMPRESSION: 1. No evidence of significant acute traumatic injury to the skull, brain or cervical spine. 2. Moderate cerebral and mild cerebellar atrophy with chronic microvascular ischemic changes throughout cerebral white matter redemonstrated, similar to prior examinations. 3. Multilevel degenerative disc disease, cervical spondylosis, and chronic compression of C6 (unchanged), similar to prior examinations.   Electronically Signed   By: Trudie Reedaniel  Entrikin M.D.   On: 02/22/2014 20:41   Dg Hip Unilat With Pelvis 2-3 Views Right  02/22/2014   CLINICAL DATA:  81 year old female with history of trauma from a fall complaining of right-sided hip pain.  EXAM: DG HIP W/ PELVIS 2-3V*R*  COMPARISON:  No priors.  FINDINGS: AP view of the pelvis demonstrates no acute displaced fracture of the bony pelvic ring. AP and lateral views of the right hip demonstrate  a mildly comminuted intertrochanteric hip fracture, mild varus rotation (approximately 10-15 degrees) and some foreshortening with proximal displacement of the distal fracture fragments. The femoral head remains located.  IMPRESSION: 1. Comminuted mildly displaced and angulated intertrochanteric fracture of the right hip, as above.   Electronically Signed   By: Trudie Reed M.D.   On: 02/22/2014 21:10     EKG Interpretation None      MDM   Final diagnoses:  Fall    fx hip,   [facility] admit,  Dr. Romeo Apple to see in am and possibly fix hip   Benny Lennert, MD 02/22/14 2131

## 2014-02-22 NOTE — ED Notes (Signed)
[facility], with complaints of a fall. Patient does not ambulate per [facility] and got up out a wheelchair and fell. Patient presents with left sided hematoma to the head, and right sided thigh pain.  Patient was packaged with a c-collar and LSB. Per [facility] staff [facility] states patient did not lose consciousness.

## 2014-02-22 NOTE — ED Notes (Signed)
Patient received 6mg  morphine IV from [facility]

## 2014-02-22 NOTE — H&P (Signed)
PCP:   Jeoffrey MassedMCGOWEN,PHILIP H, MD   Chief Complaint:  fall  HPI: 81 yo female fell out of her wheelchair today at her SNF.  Hurt right hip.  Pt does not recall events.  History obtained from ed staff and doctor and ems.  Review of Systems: . Unobtainable  Past Medical History: Past Medical History  Diagnosis Date  . Hypertension   . Dementia     CT brain 05/2013: chronic atrophy and small vessel ischemic changes.  . Depression   . Osteoarthritis   . Chronic renal insufficiency, stage III (moderate)     2013 renal u/s: medical renal dz  . Insomnia   . LBBB (left bundle branch block) 07/2006   Past Surgical History  Procedure Laterality Date  . Cardiovascular stress test  07/2006    No significant obstructive dz, normal LV EF but dyssynergic LV wall contraction c/w LBBB.  . Cardiac catheterization  07/2006    Aneurismal dilatation of coronaries, no significant astherosclerotic dz, normal LV function  . Vaginal hysterectomy  08/2002  . Rectocele repair  08/2002    with cystourethrocele repair at the same time    Medications: Prior to Admission medications   Medication Sig Start Date End Date Taking? Authorizing Provider  albuterol (PROVENTIL HFA;VENTOLIN HFA) 108 (90 BASE) MCG/ACT inhaler Inhale 2 puffs into the lungs 2 (two) times daily. 07/31/13  Yes Kelle DartingLayne C Weaver, NP  ALPRAZolam (XANAX) 1 MG tablet 1/2 tab po q 12 NOON and 1 tab po qhs Patient taking differently: Take 0.5-1 mg by mouth 2 (two) times daily. 1/2  12 NOON and 1 tab at bedtime 06/13/13  Yes Jeoffrey MassedPhilip H McGowen, MD  aspirin EC 81 MG tablet Take 81 mg by mouth daily.   Yes Historical Provider, MD  donepezil (ARICEPT) 5 MG tablet 1 tab po qhs Patient taking differently: Take 5 mg by mouth at bedtime. 1 tab po qhs 12/28/12  Yes Jeoffrey MassedPhilip H McGowen, MD  fexofenadine (ALLEGRA) 60 MG tablet Take 1 tablet (60 mg total) by mouth 2 (two) times daily. 12/25/13  Yes Jeoffrey MassedPhilip H McGowen, MD  losartan-hydrochlorothiazide (HYZAAR) 50-12.5 MG  per tablet Take 1 tablet by mouth daily.   Yes Historical Provider, MD  meloxicam (MOBIC) 7.5 MG tablet Take 7.5 mg by mouth 2 (two) times daily as needed for pain.   Yes Historical Provider, MD  Multiple Vitamin (MULTIVITAMIN WITH MINERALS) TABS tablet Take 1 tablet by mouth daily.   Yes Historical Provider, MD  omeprazole (PRILOSEC) 40 MG capsule Take 1 capsule (40 mg total) by mouth daily. 08/08/12  Yes Jeoffrey MassedPhilip H McGowen, MD  potassium chloride SA (K-DUR,KLOR-CON) 20 MEQ tablet Take 1 tablet (20 mEq total) by mouth daily. 01/05/13  Yes Jeoffrey MassedPhilip H McGowen, MD  sertraline (ZOLOFT) 100 MG tablet Take 1 tablet (100 mg total) by mouth daily. 10/11/12  Yes Jeoffrey MassedPhilip H McGowen, MD  guaifenesin (HUMIBID E) 400 MG TABS tablet Take 1 tablet (400 mg total) by mouth every 8 (eight) hours. Patient taking differently: Take 400 mg by mouth every 8 (eight) hours as needed (for cough).  07/31/13   Kelle DartingLayne C Weaver, NP  Multiple Vitamins-Minerals (MULTIVITAMIN & MINERAL PO) Take 1 tablet by mouth daily. [facility].    Historical Provider, MD  Probiotic Product (ALIGN) 4 MG CAPS 1cap PO at lunch daily for 1 month. Patient not taking: Reported on 02/13/2014 07/31/13   Kelle DartingLayne C Weaver, NP    Allergies:  No Known Allergies  Social History:  reports  that she has never smoked. She does not have any smokeless tobacco history on file. She reports that she does not drink alcohol or use illicit drugs.  Family History: History reviewed. No pertinent family history.  Physical Exam: Filed Vitals:   02/22/14 1923  BP: 142/103  Pulse: 73  Temp: 97.8 F (36.6 C)  TempSrc: Oral  Resp: 20  SpO2: 100%   General appearance: alert, cooperative and no distress Head: Normocephalic, without obvious abnormality, atraumatic Eyes: negative Nose: Nares normal. Septum midline. Mucosa normal. No drainage or sinus tenderness. Neck: no JVD and supple, symmetrical, trachea midline Lungs: clear to auscultation bilaterally Heart:  regular rate and rhythm, S1, S2 normal, no murmur, click, rub or gallop Abdomen: soft, non-tender; bowel sounds normal; no masses,  no organomegaly Extremities: extremities normal, atraumatic, no cyanosis or edema shortened and internally rotated right le with pulses intact and good color Pulses: 2+ and symmetric Skin: Skin color, texture, turgor normal. No rashes or lesions Neurologic: Grossly normal    Labs on Admission:   Recent Labs  02/22/14 2002  NA 140  K 3.5  CL 106  CO2 27  GLUCOSE 122*  BUN 24*  CREATININE 1.47*  CALCIUM 9.2    Recent Labs  02/22/14 2002  WBC 10.2  NEUTROABS 7.5  HGB 13.0  HCT 40.5  MCV 95.1  PLT 148*   Radiological Exams on Admission: Ct Head Wo Contrast  02/22/2014   CLINICAL DATA:  81 year old female with history of trauma after falling onto a hard surface floor. Headache. Altered mental status.  EXAM: CT HEAD WITHOUT CONTRAST  CT CERVICAL SPINE WITHOUT CONTRAST  TECHNIQUE: Multidetector CT imaging of the head and cervical spine was performed following the standard protocol without intravenous contrast. Multiplanar CT image reconstructions of the cervical spine were also generated.  COMPARISON:  Head CT 02/09/2014. CT of the cervical spine 02/09/2014  FINDINGS: CT HEAD FINDINGS  Moderate cerebral and mild cerebellar atrophy. Patchy and confluent areas of decreased attenuation are noted throughout the deep and periventricular white matter of the cerebral hemispheres bilaterally, compatible with chronic microvascular ischemic disease. Physiologic calcifications of the basal ganglia bilaterally (right greater than left). No acute displaced skull fractures are identified. No acute intracranial abnormality. Specifically, no evidence of acute post-traumatic intracranial hemorrhage, no definite regions of acute/subacute cerebral ischemia, no focal mass, mass effect, hydrocephalus or abnormal intra or extra-axial fluid collections. The visualized paranasal  sinuses and mastoids are well pneumatized.  CT CERVICAL SPINE FINDINGS  Mild chronic compression of the anterior aspect of C6 (approximately 30% loss of height) is unchanged compared to prior examinations. No acute displaced cervical spine fracture identified. 3 mm of anterolisthesis of C6 upon C7 (unchanged). Alignment is otherwise anatomic. Prevertebral soft tissues are normal. Multilevel degenerative disc disease and multilevel facet arthropathy, similar to the prior examination. Visualized portions of the upper thorax are unremarkable.  IMPRESSION: 1. No evidence of significant acute traumatic injury to the skull, brain or cervical spine. 2. Moderate cerebral and mild cerebellar atrophy with chronic microvascular ischemic changes throughout cerebral white matter redemonstrated, similar to prior examinations. 3. Multilevel degenerative disc disease, cervical spondylosis, and chronic compression of C6 (unchanged), similar to prior examinations.   Electronically Signed   By: Trudie Reed M.D.   On: 02/22/2014 20:41   Ct Cervical Spine Wo Contrast  02/22/2014   CLINICAL DATA:  81 year old female with history of trauma after falling onto a hard surface floor. Headache. Altered mental status.  EXAM: CT HEAD  WITHOUT CONTRAST  CT CERVICAL SPINE WITHOUT CONTRAST  TECHNIQUE: Multidetector CT imaging of the head and cervical spine was performed following the standard protocol without intravenous contrast. Multiplanar CT image reconstructions of the cervical spine were also generated.  COMPARISON:  Head CT 02/09/2014. CT of the cervical spine 02/09/2014  FINDINGS: CT HEAD FINDINGS  Moderate cerebral and mild cerebellar atrophy. Patchy and confluent areas of decreased attenuation are noted throughout the deep and periventricular white matter of the cerebral hemispheres bilaterally, compatible with chronic microvascular ischemic disease. Physiologic calcifications of the basal ganglia bilaterally (right greater than  left). No acute displaced skull fractures are identified. No acute intracranial abnormality. Specifically, no evidence of acute post-traumatic intracranial hemorrhage, no definite regions of acute/subacute cerebral ischemia, no focal mass, mass effect, hydrocephalus or abnormal intra or extra-axial fluid collections. The visualized paranasal sinuses and mastoids are well pneumatized.  CT CERVICAL SPINE FINDINGS  Mild chronic compression of the anterior aspect of C6 (approximately 30% loss of height) is unchanged compared to prior examinations. No acute displaced cervical spine fracture identified. 3 mm of anterolisthesis of C6 upon C7 (unchanged). Alignment is otherwise anatomic. Prevertebral soft tissues are normal. Multilevel degenerative disc disease and multilevel facet arthropathy, similar to the prior examination. Visualized portions of the upper thorax are unremarkable.  IMPRESSION: 1. No evidence of significant acute traumatic injury to the skull, brain or cervical spine. 2. Moderate cerebral and mild cerebellar atrophy with chronic microvascular ischemic changes throughout cerebral white matter redemonstrated, similar to prior examinations. 3. Multilevel degenerative disc disease, cervical spondylosis, and chronic compression of C6 (unchanged), similar to prior examinations.   Electronically Signed   By: Trudie Reed M.D.   On: 02/22/2014 20:41    Dg Hip Unilat With Pelvis 2-3 Views Right  02/22/2014   CLINICAL DATA:  81 year old female with history of trauma from a fall complaining of right-sided hip pain.  EXAM: DG HIP W/ PELVIS 2-3V*R*  COMPARISON:  No priors.  FINDINGS: AP view of the pelvis demonstrates no acute displaced fracture of the bony pelvic ring. AP and lateral views of the right hip demonstrate a mildly comminuted intertrochanteric hip fracture, mild varus rotation (approximately 10-15 degrees) and some foreshortening with proximal displacement of the distal fracture fragments. The  femoral head remains located.  IMPRESSION: 1. Comminuted mildly displaced and angulated intertrochanteric fracture of the right hip, as above.   Electronically Signed   By: Trudie Reed M.D.   On: 02/22/2014 21:10    Assessment/Plan  81 yo female with mechanical fall at SNF now with right comminuted mildly displaced and angulated intertrochanteric hip fracture  Principal Problem:   Hip fracture, right-  Keep npo and hold antiocoagulants until evaluation by orthopedic surgery for possible surgical repair tomorrow.  ivf overnight.  Iv pain med prn.  Active Problems:  Stable unless o/w noted   GERD (gastroesophageal reflux disease)  noted   Dementia with h/o behavioral disturbance  Noted,  High risk of developing delirium   Chronic renal insufficiency, stage III (moderate)- baseline cr 1.5, at baseline.   HTN (hypertension), benign-  Stable, hold bp meds until clarified   Fall at nursing home noted  Admit to med surg.  Presumptive full code.  Need to clarify code status with SNF (nursing to call tonight and clarify).  Zabrina Brotherton A 02/22/2014, 9:32 PM

## 2014-02-22 NOTE — ED Notes (Signed)
Patients right leg is shorter than the left leg, and is externally rotated, unable to palpate pedal pulse at this time

## 2014-02-23 ENCOUNTER — Inpatient Hospital Stay (HOSPITAL_COMMUNITY): Payer: Commercial Managed Care - HMO | Admitting: Anesthesiology

## 2014-02-23 ENCOUNTER — Encounter (HOSPITAL_COMMUNITY): Payer: Self-pay | Admitting: Orthopedic Surgery

## 2014-02-23 ENCOUNTER — Encounter (HOSPITAL_COMMUNITY)
Admission: EM | Disposition: A | Discharge status: Skilled Nursing Facility | Payer: Self-pay | Source: Home / Self Care | Attending: Internal Medicine

## 2014-02-23 ENCOUNTER — Inpatient Hospital Stay (HOSPITAL_COMMUNITY): Payer: Commercial Managed Care - HMO

## 2014-02-23 DIAGNOSIS — F0390 Unspecified dementia without behavioral disturbance: Secondary | ICD-10-CM | POA: Diagnosis present

## 2014-02-23 DIAGNOSIS — S72141A Displaced intertrochanteric fracture of right femur, initial encounter for closed fracture: Principal | ICD-10-CM

## 2014-02-23 DIAGNOSIS — R739 Hyperglycemia, unspecified: Secondary | ICD-10-CM

## 2014-02-23 DIAGNOSIS — D696 Thrombocytopenia, unspecified: Secondary | ICD-10-CM

## 2014-02-23 HISTORY — PX: COMPRESSION HIP SCREW: SHX1386

## 2014-02-23 LAB — BASIC METABOLIC PANEL
Anion gap: 5 (ref 5–15)
BUN: 24 mg/dL — ABNORMAL HIGH (ref 6–23)
CHLORIDE: 107 meq/L (ref 96–112)
CO2: 26 mmol/L (ref 19–32)
Calcium: 9 mg/dL (ref 8.4–10.5)
Creatinine, Ser: 1.51 mg/dL — ABNORMAL HIGH (ref 0.50–1.10)
GFR calc Af Amer: 36 mL/min — ABNORMAL LOW (ref >90)
GFR calc non Af Amer: 31 mL/min — ABNORMAL LOW (ref >90)
Glucose, Bld: 139 mg/dL — ABNORMAL HIGH (ref 70–99)
POTASSIUM: 4.1 mmol/L (ref 3.5–5.1)
SODIUM: 138 mmol/L (ref 135–145)

## 2014-02-23 LAB — CBC
HEMATOCRIT: 37.2 % (ref 36.0–46.0)
Hemoglobin: 12.1 g/dL (ref 12.0–15.0)
MCH: 30.9 pg (ref 26.0–34.0)
MCHC: 32.5 g/dL (ref 30.0–36.0)
MCV: 95.1 fL (ref 78.0–100.0)
PLATELETS: 139 10*3/uL — AB (ref 150–400)
RBC: 3.91 MIL/uL (ref 3.87–5.11)
RDW: 13.8 % (ref 11.5–15.5)
WBC: 10.3 10*3/uL (ref 4.0–10.5)

## 2014-02-23 LAB — PREPARE RBC (CROSSMATCH)

## 2014-02-23 LAB — ABO/RH: ABO/RH(D): A NEG

## 2014-02-23 LAB — SURGICAL PCR SCREEN
MRSA, PCR: NEGATIVE
STAPHYLOCOCCUS AUREUS: NEGATIVE

## 2014-02-23 SURGERY — COMPRESSION HIP
Anesthesia: Spinal | Laterality: Right

## 2014-02-23 MED ORDER — MORPHINE SULFATE 2 MG/ML IJ SOLN
0.5000 mg | INTRAMUSCULAR | Status: DC | PRN
  Administered 2014-02-24 – 2014-02-25 (×3): 0.5 mg via INTRAVENOUS
  Filled 2014-02-23 (×3): qty 1

## 2014-02-23 MED ORDER — SUCCINYLCHOLINE CHLORIDE 20 MG/ML IJ SOLN
INTRAMUSCULAR | Status: AC
  Filled 2014-02-23: qty 1

## 2014-02-23 MED ORDER — FENTANYL CITRATE 0.05 MG/ML IJ SOLN
INTRAMUSCULAR | Status: DC | PRN
  Administered 2014-02-23: 12.5 ug via INTRAVENOUS
  Administered 2014-02-23: 20 ug via INTRAVENOUS
  Administered 2014-02-23: 30 ug via INTRAVENOUS
  Administered 2014-02-23 (×3): 25 ug via INTRAVENOUS
  Administered 2014-02-23: 12.5 ug via INTRAVENOUS
  Administered 2014-02-23: 20 ug via INTRATHECAL
  Administered 2014-02-23: 30 ug via INTRAVENOUS

## 2014-02-23 MED ORDER — LIDOCAINE HCL (CARDIAC) 10 MG/ML IV SOLN
INTRAVENOUS | Status: DC | PRN
  Administered 2014-02-23: 50 mg via INTRAVENOUS

## 2014-02-23 MED ORDER — FENTANYL CITRATE 0.05 MG/ML IJ SOLN
INTRAMUSCULAR | Status: AC
  Filled 2014-02-23: qty 2

## 2014-02-23 MED ORDER — BISACODYL 5 MG PO TBEC: 5.0000 mg | DELAYED_RELEASE_TABLET | Freq: Every day | ORAL | Status: DC | PRN

## 2014-02-23 MED ORDER — CEFAZOLIN SODIUM-DEXTROSE 2-3 GM-% IV SOLR
INTRAVENOUS | Status: AC
  Filled 2014-02-23: qty 100

## 2014-02-23 MED ORDER — ACETAMINOPHEN 10 MG/ML IV SOLN
1000.0000 mg | Freq: Once | INTRAVENOUS | Status: AC
  Administered 2014-02-23: 1000 mg via INTRAVENOUS
  Filled 2014-02-23: qty 100

## 2014-02-23 MED ORDER — METOCLOPRAMIDE HCL 5 MG/ML IJ SOLN: 5.0000 mg | Freq: Three times a day (TID) | INTRAMUSCULAR | Status: DC | PRN

## 2014-02-23 MED ORDER — SODIUM CHLORIDE 0.9 % IV SOLN
INTRAVENOUS | Status: DC
  Administered 2014-02-23 – 2014-02-24 (×2): via INTRAVENOUS

## 2014-02-23 MED ORDER — HYDROCODONE-ACETAMINOPHEN 5-325 MG PO TABS
1.0000 | ORAL_TABLET | Freq: Four times a day (QID) | ORAL | Status: DC | PRN
  Administered 2014-02-24: 2 via ORAL
  Filled 2014-02-23: qty 2

## 2014-02-23 MED ORDER — CEFAZOLIN SODIUM-DEXTROSE 2-3 GM-% IV SOLR
2.0000 g | Freq: Four times a day (QID) | INTRAVENOUS | Status: AC
  Administered 2014-02-23 – 2014-02-24 (×2): 2 g via INTRAVENOUS
  Filled 2014-02-23 (×2): qty 50

## 2014-02-23 MED ORDER — SODIUM CHLORIDE 0.9 % IJ SOLN
INTRAMUSCULAR | Status: AC
  Filled 2014-02-23: qty 30

## 2014-02-23 MED ORDER — MENTHOL 3 MG MT LOZG: 1.0000 | LOZENGE | OROMUCOSAL | Status: DC | PRN

## 2014-02-23 MED ORDER — TRAMADOL HCL 50 MG PO TABS
50.0000 mg | ORAL_TABLET | Freq: Four times a day (QID) | ORAL | Status: DC
  Administered 2014-02-23 – 2014-02-25 (×8): 50 mg via ORAL
  Filled 2014-02-23 (×8): qty 1

## 2014-02-23 MED ORDER — ACETAMINOPHEN 325 MG PO TABS: 650.0000 mg | ORAL_TABLET | Freq: Four times a day (QID) | ORAL | Status: DC | PRN

## 2014-02-23 MED ORDER — ADULT MULTIVITAMIN W/MINERALS CH: ORAL_TABLET | Freq: Every day | ORAL | Status: DC

## 2014-02-23 MED ORDER — SODIUM CHLORIDE 0.9 % IV SOLN
INTRAVENOUS | Status: DC | PRN
  Administered 2014-02-23: 15:00:00 via INTRAVENOUS

## 2014-02-23 MED ORDER — PHENOL 1.4 % MT LIQD: 1.0000 | OROMUCOSAL | Status: DC | PRN

## 2014-02-23 MED ORDER — POLYETHYLENE GLYCOL 3350 17 G PO PACK
17.0000 g | PACK | Freq: Every day | ORAL | Status: DC
  Administered 2014-02-24 – 2014-02-26 (×3): 17 g via ORAL
  Filled 2014-02-23 (×3): qty 1

## 2014-02-23 MED ORDER — ACETAMINOPHEN 10 MG/ML IV SOLN
1000.0000 mg | Freq: Four times a day (QID) | INTRAVENOUS | Status: AC
  Administered 2014-02-24 (×3): 1000 mg via INTRAVENOUS
  Filled 2014-02-23 (×3): qty 100

## 2014-02-23 MED ORDER — ASPIRIN EC 325 MG PO TBEC
325.0000 mg | DELAYED_RELEASE_TABLET | Freq: Every day | ORAL | Status: DC
  Administered 2014-02-24 – 2014-02-26 (×3): 325 mg via ORAL
  Filled 2014-02-23 (×3): qty 1

## 2014-02-23 MED ORDER — GLYCOPYRROLATE 0.2 MG/ML IJ SOLN
INTRAMUSCULAR | Status: AC
  Filled 2014-02-23: qty 1

## 2014-02-23 MED ORDER — ADULT MULTIVITAMIN W/MINERALS CH
1.0000 | ORAL_TABLET | Freq: Every day | ORAL | Status: DC
  Administered 2014-02-24 – 2014-02-26 (×3): 1 via ORAL
  Filled 2014-02-23 (×3): qty 1

## 2014-02-23 MED ORDER — CEFAZOLIN SODIUM-DEXTROSE 2-3 GM-% IV SOLR
INTRAVENOUS | Status: DC | PRN
  Administered 2014-02-23: 2 g via INTRAVENOUS

## 2014-02-23 MED ORDER — ACETAMINOPHEN 10 MG/ML IV SOLN
INTRAVENOUS | Status: AC
  Filled 2014-02-23: qty 200

## 2014-02-23 MED ORDER — CEFAZOLIN SODIUM-DEXTROSE 2-3 GM-% IV SOLR: 2.0000 g | INTRAVENOUS | Status: DC

## 2014-02-23 MED ORDER — SODIUM CHLORIDE 0.9 % IR SOLN
Status: DC | PRN
  Administered 2014-02-23: 1000 mL

## 2014-02-23 MED ORDER — CEFAZOLIN SODIUM-DEXTROSE 2-3 GM-% IV SOLR
INTRAVENOUS | Status: AC
  Filled 2014-02-23: qty 50

## 2014-02-23 MED ORDER — LIDOCAINE HCL (PF) 1 % IJ SOLN
INTRAMUSCULAR | Status: AC
  Filled 2014-02-23: qty 5

## 2014-02-23 MED ORDER — PHENYLEPHRINE HCL 10 MG/ML IJ SOLN
INTRAMUSCULAR | Status: DC | PRN
  Administered 2014-02-23: 100 ug via INTRAVENOUS
  Administered 2014-02-23: 50 ug via INTRAVENOUS
  Administered 2014-02-23 (×3): 100 ug via INTRAVENOUS

## 2014-02-23 MED ORDER — BUPIVACAINE-EPINEPHRINE (PF) 0.5% -1:200000 IJ SOLN
INTRAMUSCULAR | Status: AC
  Filled 2014-02-23: qty 60

## 2014-02-23 MED ORDER — BUPIVACAINE-EPINEPHRINE (PF) 0.5% -1:200000 IJ SOLN
INTRAMUSCULAR | Status: DC | PRN
  Administered 2014-02-23: 60 mL

## 2014-02-23 MED ORDER — LACTATED RINGERS IV SOLN
INTRAVENOUS | Status: DC | PRN
  Administered 2014-02-23: 16:00:00 via INTRAVENOUS

## 2014-02-23 MED ORDER — FENTANYL CITRATE 0.05 MG/ML IJ SOLN
INTRAMUSCULAR | Status: AC
  Filled 2014-02-23: qty 5

## 2014-02-23 MED ORDER — SENNA 8.6 MG PO TABS
1.0000 | ORAL_TABLET | Freq: Two times a day (BID) | ORAL | Status: DC
  Administered 2014-02-23 – 2014-02-26 (×6): 8.6 mg via ORAL
  Filled 2014-02-23 (×6): qty 1

## 2014-02-23 MED ORDER — SERTRALINE HCL 50 MG PO TABS
100.0000 mg | ORAL_TABLET | Freq: Every day | ORAL | Status: DC
  Administered 2014-02-24 – 2014-02-26 (×3): 100 mg via ORAL
  Filled 2014-02-23 (×3): qty 2

## 2014-02-23 MED ORDER — PROPOFOL INFUSION 10 MG/ML OPTIME
INTRAVENOUS | Status: DC | PRN
  Administered 2014-02-23: 75 ug/kg/min via INTRAVENOUS
  Administered 2014-02-23: 16:00:00 via INTRAVENOUS

## 2014-02-23 MED ORDER — BUPIVACAINE HCL (PF) 0.75 % IJ SOLN
INTRAMUSCULAR | Status: DC | PRN
  Administered 2014-02-23: 13 mg via INTRATHECAL

## 2014-02-23 MED ORDER — PHENYLEPHRINE HCL 10 MG/ML IJ SOLN
INTRAMUSCULAR | Status: AC
  Filled 2014-02-23: qty 1

## 2014-02-23 MED ORDER — PROPOFOL 10 MG/ML IV BOLUS
INTRAVENOUS | Status: AC
  Filled 2014-02-23: qty 20

## 2014-02-23 MED ORDER — EPHEDRINE SULFATE 50 MG/ML IJ SOLN
INTRAMUSCULAR | Status: DC | PRN
  Administered 2014-02-23 (×3): 5 mg via INTRAVENOUS

## 2014-02-23 MED ORDER — MIDAZOLAM HCL 2 MG/2ML IJ SOLN
INTRAMUSCULAR | Status: AC
  Filled 2014-02-23: qty 2

## 2014-02-23 MED ORDER — CHLORHEXIDINE GLUCONATE 4 % EX LIQD
60.0000 mL | Freq: Once | CUTANEOUS | Status: AC
  Administered 2014-02-23: 4 via TOPICAL

## 2014-02-23 MED ORDER — MIDAZOLAM HCL 5 MG/5ML IJ SOLN
INTRAMUSCULAR | Status: DC | PRN
  Administered 2014-02-23: 1 mg via INTRAVENOUS
  Administered 2014-02-23 (×2): 0.5 mg via INTRAVENOUS

## 2014-02-23 MED ORDER — METOCLOPRAMIDE HCL 10 MG PO TABS: 5.0000 mg | ORAL_TABLET | Freq: Three times a day (TID) | ORAL | Status: DC | PRN

## 2014-02-23 MED ORDER — BUPIVACAINE IN DEXTROSE 0.75-8.25 % IT SOLN
INTRATHECAL | Status: AC
  Filled 2014-02-23: qty 2

## 2014-02-23 MED ORDER — ATROPINE SULFATE 0.4 MG/ML IJ SOLN
INTRAMUSCULAR | Status: AC
Start: 2014-02-23 — End: 2014-02-23
  Filled 2014-02-23: qty 1

## 2014-02-23 MED ORDER — ARTIFICIAL TEARS OP OINT
TOPICAL_OINTMENT | OPHTHALMIC | Status: AC
Start: 2014-02-23 — End: 2014-02-23
  Filled 2014-02-23: qty 3.5

## 2014-02-23 MED ORDER — ALUM & MAG HYDROXIDE-SIMETH 200-200-20 MG/5ML PO SUSP: 30.0000 mL | ORAL | Status: DC | PRN

## 2014-02-23 MED ORDER — DOCUSATE SODIUM 100 MG PO CAPS
100.0000 mg | ORAL_CAPSULE | Freq: Two times a day (BID) | ORAL | Status: DC
  Administered 2014-02-23 – 2014-02-26 (×6): 100 mg via ORAL
  Filled 2014-02-23 (×6): qty 1

## 2014-02-23 MED ORDER — DONEPEZIL HCL 5 MG PO TABS
5.0000 mg | ORAL_TABLET | Freq: Every day | ORAL | Status: DC
  Administered 2014-02-23 – 2014-02-25 (×3): 5 mg via ORAL
  Filled 2014-02-23 (×3): qty 1

## 2014-02-23 MED ORDER — ACETAMINOPHEN 650 MG RE SUPP: 650.0000 mg | Freq: Four times a day (QID) | RECTAL | Status: DC | PRN

## 2014-02-23 MED ORDER — ONDANSETRON HCL 4 MG/2ML IJ SOLN: 4.0000 mg | Freq: Four times a day (QID) | INTRAMUSCULAR | Status: DC | PRN

## 2014-02-23 MED ORDER — EPHEDRINE SULFATE 50 MG/ML IJ SOLN
INTRAMUSCULAR | Status: AC
  Filled 2014-02-23: qty 1

## 2014-02-23 MED ORDER — ONDANSETRON HCL 4 MG PO TABS: 4.0000 mg | ORAL_TABLET | Freq: Four times a day (QID) | ORAL | Status: DC | PRN

## 2014-02-23 MED ORDER — ALPRAZOLAM 0.5 MG PO TABS
0.5000 mg | ORAL_TABLET | Freq: Two times a day (BID) | ORAL | Status: DC
  Administered 2014-02-23 – 2014-02-26 (×6): 0.5 mg via ORAL
  Filled 2014-02-23 (×6): qty 1

## 2014-02-23 SURGICAL SUPPLY — 44 items
BAG HAMPER (MISCELLANEOUS) ×3 IMPLANT
BIT DRILL TWIST 3.5MM (BIT) ×1 IMPLANT
BLADE SURG SZ10 CARB STEEL (BLADE) ×6 IMPLANT
CLOTH BEACON ORANGE TIMEOUT ST (SAFETY) ×3 IMPLANT
COVER LIGHT HANDLE STERIS (MISCELLANEOUS) ×6 IMPLANT
COVER MAYO STAND XLG (DRAPE) ×3 IMPLANT
DRAPE STERI IOBAN 125X83 (DRAPES) ×3 IMPLANT
DRILL TWIST 3.5MM (BIT) ×3
DRSG MEPILEX BORDER 4X8 (GAUZE/BANDAGES/DRESSINGS) ×3 IMPLANT
ELECT REM PT RETURN 9FT ADLT (ELECTROSURGICAL) ×3
ELECTRODE REM PT RTRN 9FT ADLT (ELECTROSURGICAL) ×1 IMPLANT
GLOVE SKINSENSE NS SZ8.0 LF (GLOVE) ×2
GLOVE SKINSENSE STRL SZ8.0 LF (GLOVE) ×1 IMPLANT
GLOVE SS N UNI LF 8.5 STRL (GLOVE) ×3 IMPLANT
GOWN STRL REUS W/TWL LRG LVL3 (GOWN DISPOSABLE) ×3 IMPLANT
GOWN STRL REUS W/TWL XL LVL3 (GOWN DISPOSABLE) ×3 IMPLANT
GUIDE PIN 3.2X230MM (Pin) ×3 IMPLANT
GUIDE PIN CALIBRATED (PIN) ×3 IMPLANT
INST SET MAJOR BONE (KITS) ×3 IMPLANT
KIT BLADEGUARD II DBL (SET/KITS/TRAYS/PACK) ×3 IMPLANT
KIT ROOM TURNOVER AP CYSTO (KITS) ×3 IMPLANT
MANIFOLD NEPTUNE II (INSTRUMENTS) ×3 IMPLANT
MARKER SKIN DUAL TIP RULER LAB (MISCELLANEOUS) ×3 IMPLANT
NS IRRIG 1000ML POUR BTL (IV SOLUTION) ×3 IMPLANT
PACK BASIC III (CUSTOM PROCEDURE TRAY) ×2
PACK SRG BSC III STRL LF ECLPS (CUSTOM PROCEDURE TRAY) ×1 IMPLANT
PAD ARMBOARD 7.5X6 YLW CONV (MISCELLANEOUS) ×3 IMPLANT
PENCIL HANDSWITCHING (ELECTRODE) ×3 IMPLANT
PIN GUIDE 3.2X230MM (Pin) ×1 IMPLANT
PLATE 135X4 HOLE (Plate) ×3 IMPLANT
SCREW COMPRESSION 19.0M (Screw) ×3 IMPLANT
SCREW CORTICAL SFTP 4.5X36MM (Screw) ×6 IMPLANT
SCREW CORTICAL SFTP 4.5X38MM (Screw) ×3 IMPLANT
SCREW CORTICAL SFTP 4.5X42MM (Screw) ×3 IMPLANT
SCREW LAG 125MM (Screw) ×2 IMPLANT
SCREW LAG STD 125X21X.5X9X (Screw) ×1 IMPLANT
SET BASIN LINEN APH (SET/KITS/TRAYS/PACK) ×3 IMPLANT
SPONGE LAP 18X18 X RAY DECT (DISPOSABLE) ×9 IMPLANT
STAPLER VISISTAT 35W (STAPLE) ×3 IMPLANT
SUT BRALON NAB BRD #1 30IN (SUTURE) ×6 IMPLANT
SUT MNCRL 0 VIOLET CTX 36 (SUTURE) ×3 IMPLANT
SUT MONOCRYL 0 CTX 36 (SUTURE) ×6
SYR BULB IRRIGATION 50ML (SYRINGE) ×6 IMPLANT
YANKAUER SUCT 12FT TUBE ARGYLE (SUCTIONS) ×3 IMPLANT

## 2014-02-23 NOTE — Progress Notes (Signed)
From OR. Awake. Yellowish blue bruises to left upper arm and left upper outer thigh. "picking" at IV sites. IV sites wrapped with kerlex gauze. Tolerated well.

## 2014-02-23 NOTE — Anesthesia Postprocedure Evaluation (Signed)
  Anesthesia Post-op Note  Patient: Laura KennedyPolly K Serna  Procedure(s) Performed: Procedure(s): OPEN TREATMENT INTERNAL FIXATION RIGHT HIP (Right)  Patient Location: PACU  Anesthesia Type:Spinal  Level of Consciousness: awake, alert , patient cooperative and confused  Airway and Oxygen Therapy: Patient Spontanous Breathing and Patient connected to nasal cannula oxygen  Post-op Pain: none  Post-op Assessment: Post-op Vital signs reviewed, Patient's Cardiovascular Status Stable, Respiratory Function Stable, Patent Airway and Pain level controlled  Post-op Vital Signs: Reviewed and stable  Last Vitals:  Filed Vitals:   02/23/14 1745  BP: 114/51  Pulse: 69  Temp:   Resp: 14    Complications: No apparent anesthesia complications

## 2014-02-23 NOTE — Op Note (Signed)
02/22/2014 - 02/23/2014  5:30 PM  PATIENT:  Laura Savage  81 y.o. female  PRE-OPERATIVE DIAGNOSIS:  Right intertrochanteric hip fracture   POST-OPERATIVE DIAGNOSIS:  Right intertrochanteric hip fracture   PROCEDURE:  Procedure(s): OPEN TREATMENT INTERNAL FIXATION RIGHT HIP (Right)   Implants Smith nephew dynamic hip screw 125 mm lag screw 135 4-hole plate 42 mm 4.5 cortical screw 38 mm, 2 x 36 mm, screws 19 mm compression screw  Surgical findings two-part inotropic fracture with vertical fracture at the level of the lesser trochanter  Details of procedure  The patient was identified in the preop holding area the right hip confirmed as surgical site. Site marked. Chart review. Patient takes a the operating room given 2 g of Ancef IV. Spinal anesthetic was administered. Patient placed on fracture table. Left leg abducted. Right leg placed in traction. C-arm brought in for closed reduction. Traction and internal rotation reduced the fracture. Fracture confirmed to be reduced and stable on AP and lateral x-ray.  ChloraPrep was used to prep the leg. Sterile draping. Timeout completed.  The incision was started at the tip of the trochanter and carried distally. Subcutaneous tissue divided down to fascia. Subcutaneous bleeders coagulated with cautery. Fascia was split in line with the skin incision. The vastus lateralis musculature and fascia was split down to bone followed by subperiosteal dissection. A 135 angle guide was used to place the pin in the center of the femoral head. This was performed with the assistance of the C-arm. Acceptable tip apex distance was confirmed with C-arm.  Guidepin was measured 125 mm. Triple reamer was set for 125 mm and the reamer was passed over the guide pin. The lag screw was passed over the guide pin the guidepin was removed the plate was attached to the bone using AO technique and 4 cortical screws as described above. C-arm x-rays confirmed plate position  and fracture reduction. Both were acceptable.  Bleeding was controlled with electrocautery. The wound was irrigated the fascia of the vastus lateralis was closed with 0 Monocryl suture 30 mL of Marcaine was injected and this layer. The wound was irrigated again. #1 Bralon was used to close the fascia. Second 30 mL of Marcaine was injected. 0 Monocryl suture was used to close the subcutaneous tissue. Skin was reapproximated with staples.  Sterile bandage was applied   SURGEON:  Surgeon(s) and Role:    * Kaylob Wallen E Kameela Leipold, MD - Primary  PHYSICIAN ASSISTANT:   ASSISTANTS: none   ANESTHESIA:   spinal  EBL:  Total I/O In: 1300 [I.V.:1300] Out: 300 [Urine:100; Blood:200]  BLOOD ADMINISTERED:none  DRAINS: none   LOCAL MEDICATIONS USED:  MARCAINE   , Amount: 60 ml and OTHER w/ epi  SPECIMEN:  No Specimen  DISPOSITION OF SPECIMEN:  N/A  COUNTS:  YES  TOURNIQUET:  * No tourniquets in log *  DICTATION: .Dragon Dictation  PLAN OF CARE: Admit to inpatient   PATIENT DISPOSITION:  PACU - hemodynamically stable.   Delay start of Pharmacological VTE agent (>24hrs) due to surgical blood loss or risk of bleeding: yes  

## 2014-02-23 NOTE — Progress Notes (Signed)
Awake. Swallowing without difficulty. Swallowed tramadol without problems. Tolerated well.

## 2014-02-23 NOTE — Progress Notes (Signed)
[facility] PROGRESS NOTE  Laura Savage WUJ:[id number] DOB: January 10, 1930 DOA: 02/22/2014 PCP: Jeoffrey Massed, MD    Code Status: Full code Family Communication: Discussed with son Disposition Plan: Likely discharge to skilled nursing facility following hip surgery when appropriate.   Consultants:  Orthopedic surgeon, Dr. Romeo Apple  Procedures:  02/22/14-hip surgery pending  Antibiotics:  None  HPI/Subjective: The patient is currently lying in bed. She has no complaints of pain or chest congestion or chest pain. She does have some pain in her right hip, particularly when it is examined, but not at rest.  Objective: Filed Vitals:   02/23/14 0552  BP: 127/59  Pulse: 75  Temp: 99.6 F (37.6 C)  Resp: 20   oxygen saturation 98%.  Intake/Output Summary (Last 24 hours) at 02/23/14 1140 Last data filed at 02/23/14 0600  Gross per 24 hour  Intake 451.25 ml  Output    500 ml  Net -48.75 ml   Filed Weights   02/22/14 2308  Weight: 80.4 kg (177 lb 4 oz)    Exam:   General:  Alert obese 81 year old, in no acute distress.  Cardiovascular: S1, S2, with a mild sulci systolic murmur.  Respiratory: Clear anteriorly with decreased breath sounds in the bases.  Abdomen: Obese, positive bowel sounds, soft, nontender, nondistended.  Musculoskeletal: Right lower extremity rotated and shortened; moderately tender to palpation of the right hip; trace of pedal edema. No abnormalities of the left lower extremity.    Data Reviewed: Basic Metabolic Panel:  Recent Labs Lab 02/22/14 2002 02/23/14 0526  NA 140 138  K 3.5 4.1  CL 106 107  CO2 27 26  GLUCOSE 122* 139*  BUN 24* 24*  CREATININE 1.47* 1.51*  CALCIUM 9.2 9.0   Liver Function Tests: No results for input(s): AST, ALT, ALKPHOS, BILITOT, PROT, ALBUMIN in the last 168 hours. No results for input(s): LIPASE, AMYLASE in the last 168 hours. No results for input(s): AMMONIA in the last 168 hours. CBC:  Recent  Labs Lab 02/22/14 2002 02/23/14 0526  WBC 10.2 10.3  NEUTROABS 7.5  --   HGB 13.0 12.1  HCT 40.5 37.2  MCV 95.1 95.1  PLT 148* 139*   Cardiac Enzymes: No results for input(s): CKTOTAL, CKMB, CKMBINDEX, TROPONINI in the last 168 hours. BNP (last 3 results) No results for input(s): PROBNP in the last 8760 hours. CBG: No results for input(s): GLUCAP in the last 168 hours.  Recent Results (from the past 240 hour(s))  Surgical pcr screen     Status: None   Collection Time: 02/23/14 12:16 AM  Result Value Ref Range Status   MRSA, PCR NEGATIVE NEGATIVE Final   Staphylococcus aureus NEGATIVE NEGATIVE Final    Comment:        The Xpert SA Assay (FDA approved for NASAL specimens in patients over 27 years of age), is one component of a comprehensive surveillance program.  Test performance has been validated by [employer] for patients greater than or equal to 47 year old. It is not intended to diagnose infection nor to guide or monitor treatment.      Studies: Ct Head Wo Contrast  02/22/2014   CLINICAL DATA:  81 year old female with history of trauma after falling onto a hard surface floor. Headache. Altered mental status.  EXAM: CT HEAD WITHOUT CONTRAST  CT CERVICAL SPINE WITHOUT CONTRAST  TECHNIQUE: Multidetector CT imaging of the head and cervical spine was performed following the standard protocol without intravenous contrast. Multiplanar CT image reconstructions of  the cervical spine were also generated.  COMPARISON:  Head CT 02/09/2014. CT of the cervical spine 02/09/2014  FINDINGS: CT HEAD FINDINGS  Moderate cerebral and mild cerebellar atrophy. Patchy and confluent areas of decreased attenuation are noted throughout the deep and periventricular white matter of the cerebral hemispheres bilaterally, compatible with chronic microvascular ischemic disease. Physiologic calcifications of the basal ganglia bilaterally (right greater than left). No acute displaced skull fractures are  identified. No acute intracranial abnormality. Specifically, no evidence of acute post-traumatic intracranial hemorrhage, no definite regions of acute/subacute cerebral ischemia, no focal mass, mass effect, hydrocephalus or abnormal intra or extra-axial fluid collections. The visualized paranasal sinuses and mastoids are well pneumatized.  CT CERVICAL SPINE FINDINGS  Mild chronic compression of the anterior aspect of C6 (approximately 30% loss of height) is unchanged compared to prior examinations. No acute displaced cervical spine fracture identified. 3 mm of anterolisthesis of C6 upon C7 (unchanged). Alignment is otherwise anatomic. Prevertebral soft tissues are normal. Multilevel degenerative disc disease and multilevel facet arthropathy, similar to the prior examination. Visualized portions of the upper thorax are unremarkable.  IMPRESSION: 1. No evidence of significant acute traumatic injury to the skull, brain or cervical spine. 2. Moderate cerebral and mild cerebellar atrophy with chronic microvascular ischemic changes throughout cerebral white matter redemonstrated, similar to prior examinations. 3. Multilevel degenerative disc disease, cervical spondylosis, and chronic compression of C6 (unchanged), similar to prior examinations.   Electronically Signed   By: Trudie Reedaniel  Entrikin M.D.   On: 02/22/2014 20:41   Ct Cervical Spine Wo Contrast  02/22/2014   CLINICAL DATA:  81 year old female with history of trauma after falling onto a hard surface floor. Headache. Altered mental status.  EXAM: CT HEAD WITHOUT CONTRAST  CT CERVICAL SPINE WITHOUT CONTRAST  TECHNIQUE: Multidetector CT imaging of the head and cervical spine was performed following the standard protocol without intravenous contrast. Multiplanar CT image reconstructions of the cervical spine were also generated.  COMPARISON:  Head CT 02/09/2014. CT of the cervical spine 02/09/2014  FINDINGS: CT HEAD FINDINGS  Moderate cerebral and mild cerebellar  atrophy. Patchy and confluent areas of decreased attenuation are noted throughout the deep and periventricular white matter of the cerebral hemispheres bilaterally, compatible with chronic microvascular ischemic disease. Physiologic calcifications of the basal ganglia bilaterally (right greater than left). No acute displaced skull fractures are identified. No acute intracranial abnormality. Specifically, no evidence of acute post-traumatic intracranial hemorrhage, no definite regions of acute/subacute cerebral ischemia, no focal mass, mass effect, hydrocephalus or abnormal intra or extra-axial fluid collections. The visualized paranasal sinuses and mastoids are well pneumatized.  CT CERVICAL SPINE FINDINGS  Mild chronic compression of the anterior aspect of C6 (approximately 30% loss of height) is unchanged compared to prior examinations. No acute displaced cervical spine fracture identified. 3 mm of anterolisthesis of C6 upon C7 (unchanged). Alignment is otherwise anatomic. Prevertebral soft tissues are normal. Multilevel degenerative disc disease and multilevel facet arthropathy, similar to the prior examination. Visualized portions of the upper thorax are unremarkable.  IMPRESSION: 1. No evidence of significant acute traumatic injury to the skull, brain or cervical spine. 2. Moderate cerebral and mild cerebellar atrophy with chronic microvascular ischemic changes throughout cerebral white matter redemonstrated, similar to prior examinations. 3. Multilevel degenerative disc disease, cervical spondylosis, and chronic compression of C6 (unchanged), similar to prior examinations.   Electronically Signed   By: Trudie Reedaniel  Entrikin M.D.   On: 02/22/2014 20:41   Dg Hip Unilat With Pelvis 2-3 Views Right  02/22/2014  CLINICAL DATA:  81 year old female with history of trauma from a fall complaining of right-sided hip pain.  EXAM: DG HIP W/ PELVIS 2-3V*R*  COMPARISON:  No priors.  FINDINGS: AP view of the pelvis  demonstrates no acute displaced fracture of the bony pelvic ring. AP and lateral views of the right hip demonstrate a mildly comminuted intertrochanteric hip fracture, mild varus rotation (approximately 10-15 degrees) and some foreshortening with proximal displacement of the distal fracture fragments. The femoral head remains located.  IMPRESSION: 1. Comminuted mildly displaced and angulated intertrochanteric fracture of the right hip, as above.   Electronically Signed   By: Trudie Reed M.D.   On: 02/22/2014 21:10    Scheduled Meds: . albuterol  3 mL Inhalation BID  . chlorhexidine  60 mL Topical Once   Continuous Infusions:  Assessment and plan: Principal Problem:   Hip fracture, right Active Problems:   Fall at nursing home   GERD (gastroesophageal reflux disease)   Dysphagia   Chronic renal insufficiency, stage III (moderate)   HTN (hypertension), benign   Thrombocytopenia   Hyperglycemia   Obesity, morbid   Chronic dementia   1. Acute comminuted mildly displaced and angulated into jerking to fracture of the right hip, status post fall at the nursing home. Orthopedic surgeon Dr. Romeo Apple has evaluated the patient and plans an operation today. Continue analgesics and supportive treatment as needed. Postoperative wound management and DVT prophylaxis and therapy per Dr. Romeo Apple.  Hypertension. Currently stable and within normal limits. We'll restart losartan-hydrochlorothiazide tomorrow if her blood pressure is in the hypertensive range.  GERD with a history of dysphagia. We'll restart omeprazole. When she has no long nothing by mouth, will start a dysphagia diet.  Chronic kidney disease, stage III. Her creatinine appears to be in the range of stage III chronic kidney disease.  Thrombocytopenia. Etiology is unknown. We'll order a vitamin B12 level and TSH for further evaluation.  Hyperglycemia. This is mild and may be secondary to possible dextrose given in the   previous IV fluids. We'll order hemoglobin A1c and monitor.  Chronic dementia with depression and anxiety. No evidence of behavioral disturbances currently. We'll restart Aricept, Zoloft, and Xanax when she is not nothing by mouth.   Time spent: 35 minutes    [facility]  [facility] Pager [phone]. If 7PM-7AM, please contact night-coverage at [email or web address], password [facility] 02/23/2014, 11:40 AM  LOS: 1 day

## 2014-02-23 NOTE — Consult Note (Signed)
Reason for Consult: Right hip fracture Referring Physician: DR Jeraldine Loots is an 81 y.o. female.  HPI: 81 year old female household ambulator with a walker and assistance got up unattended and fell fractured her right hip. She has a right peritrochanteric basicervical intertrochanteric type hip fracture. Has mild pain at rest more severe pain when she tries to move her right leg. Pain localized over the right greater trochanter groin proximal thigh nonradiating.  Past Medical History  Diagnosis Date  . Hypertension   . Dementia     CT brain 05/2013: chronic atrophy and small vessel ischemic changes.  . Depression   . Osteoarthritis   . Chronic renal insufficiency, stage III (moderate)     2013 renal u/s: medical renal dz  . Insomnia   . LBBB (left bundle branch block) 07/2006    Past Surgical History  Procedure Laterality Date  . Cardiovascular stress test  07/2006    No significant obstructive dz, normal LV EF but dyssynergic LV wall contraction c/w LBBB.  . Cardiac catheterization  07/2006    Aneurismal dilatation of coronaries, no significant astherosclerotic dz, normal LV function  . Vaginal hysterectomy  08/2002  . Rectocele repair  08/2002    with cystourethrocele repair at the same time    History reviewed. No pertinent family history.  Social History:  reports that she has never smoked. She does not have any smokeless tobacco history on file. She reports that she does not drink alcohol or use illicit drugs.  Allergies: No Known Allergies  Medications: I have reviewed the patient's current medications.  Results for orders placed or performed during the hospital encounter of 02/22/14 (from the past 48 hour(s))  CBC with Differential     Status: Abnormal   Collection Time: 02/22/14  8:02 PM  Result Value Ref Range   WBC 10.2 4.0 - 10.5 K/uL   RBC 4.26 3.87 - 5.11 MIL/uL   Hemoglobin 13.0 12.0 - 15.0 g/dL   HCT 40.5 36.0 - 46.0 %   MCV 95.1 78.0 - 100.0 fL   MCH  30.5 26.0 - 34.0 pg   MCHC 32.1 30.0 - 36.0 g/dL   RDW 13.6 11.5 - 15.5 %   Platelets 148 (L) 150 - 400 K/uL   Neutrophils Relative % 73 43 - 77 %   Neutro Abs 7.5 1.7 - 7.7 K/uL   Lymphocytes Relative 20 12 - 46 %   Lymphs Abs 2.0 0.7 - 4.0 K/uL   Monocytes Relative 5 3 - 12 %   Monocytes Absolute 0.5 0.1 - 1.0 K/uL   Eosinophils Relative 2 0 - 5 %   Eosinophils Absolute 0.2 0.0 - 0.7 K/uL   Basophils Relative 0 0 - 1 %   Basophils Absolute 0.0 0.0 - 0.1 K/uL  Basic metabolic panel     Status: Abnormal   Collection Time: 02/22/14  8:02 PM  Result Value Ref Range   Sodium 140 135 - 145 mmol/L    Comment: Please note change in reference range.   Potassium 3.5 3.5 - 5.1 mmol/L    Comment: Please note change in reference range.   Chloride 106 96 - 112 mEq/L   CO2 27 19 - 32 mmol/L   Glucose, Bld 122 (H) 70 - 99 mg/dL   BUN 24 (H) 6 - 23 mg/dL   Creatinine, Ser 1.47 (H) 0.50 - 1.10 mg/dL   Calcium 9.2 8.4 - 10.5 mg/dL   GFR calc non Af Amer 32 (L) >  90 mL/min   GFR calc Af Amer 37 (L) >90 mL/min    Comment: (NOTE) The eGFR has been calculated using the CKD EPI equation. This calculation has not been validated in all clinical situations. eGFR's persistently <90 mL/min signify possible Chronic Kidney Disease.    Anion gap 7 5 - 15  Urinalysis, Routine w reflex microscopic     Status: Abnormal   Collection Time: 02/22/14  9:16 PM  Result Value Ref Range   Color, Urine YELLOW YELLOW   APPearance CLEAR CLEAR   Specific Gravity, Urine 1.020 1.005 - 1.030   pH 6.0 5.0 - 8.0   Glucose, UA NEGATIVE NEGATIVE mg/dL   Hgb urine dipstick TRACE (A) NEGATIVE   Bilirubin Urine NEGATIVE NEGATIVE   Ketones, ur NEGATIVE NEGATIVE mg/dL   Protein, ur NEGATIVE NEGATIVE mg/dL   Urobilinogen, UA 0.2 0.0 - 1.0 mg/dL   Nitrite NEGATIVE NEGATIVE   Leukocytes, UA NEGATIVE NEGATIVE  Urine microscopic-add on     Status: None   Collection Time: 02/22/14  9:16 PM  Result Value Ref Range   Squamous  Epithelial / LPF RARE RARE   WBC, UA 0-2 <3 WBC/hpf   RBC / HPF 3-6 <3 RBC/hpf   Bacteria, UA RARE RARE  Type and screen     Status: None   Collection Time: 02/22/14 11:47 PM  Result Value Ref Range   ABO/RH(D) A NEG    Antibody Screen NEG    Sample Expiration 02/25/2014   Surgical pcr screen     Status: None   Collection Time: 02/23/14 12:16 AM  Result Value Ref Range   MRSA, PCR NEGATIVE NEGATIVE   Staphylococcus aureus NEGATIVE NEGATIVE    Comment:        The Xpert SA Assay (FDA approved for NASAL specimens in patients over 61 years of age), is one component of a comprehensive surveillance program.  Test performance has been validated by [employer] for patients greater than or equal to 51 year old. It is not intended to diagnose infection nor to guide or monitor treatment.   CBC     Status: Abnormal   Collection Time: 02/23/14  5:26 AM  Result Value Ref Range   WBC 10.3 4.0 - 10.5 K/uL   RBC 3.91 3.87 - 5.11 MIL/uL   Hemoglobin 12.1 12.0 - 15.0 g/dL   HCT 37.2 36.0 - 46.0 %   MCV 95.1 78.0 - 100.0 fL   MCH 30.9 26.0 - 34.0 pg   MCHC 32.5 30.0 - 36.0 g/dL   RDW 13.8 11.5 - 15.5 %   Platelets 139 (L) 150 - 400 K/uL  Basic metabolic panel     Status: Abnormal   Collection Time: 02/23/14  5:26 AM  Result Value Ref Range   Sodium 138 135 - 145 mmol/L    Comment: Please note change in reference range.   Potassium 4.1 3.5 - 5.1 mmol/L    Comment: Please note change in reference range.   Chloride 107 96 - 112 mEq/L   CO2 26 19 - 32 mmol/L   Glucose, Bld 139 (H) 70 - 99 mg/dL   BUN 24 (H) 6 - 23 mg/dL   Creatinine, Ser 1.51 (H) 0.50 - 1.10 mg/dL   Calcium 9.0 8.4 - 10.5 mg/dL   GFR calc non Af Amer 31 (L) >90 mL/min   GFR calc Af Amer 36 (L) >90 mL/min    Comment: (NOTE) The eGFR has been calculated using the CKD EPI equation. This  calculation has not been validated in all clinical situations. eGFR's persistently <90 mL/min signify possible Chronic  Kidney Disease.    Anion gap 5 5 - 15    Ct Head Wo Contrast  02/22/2014   CLINICAL DATA:  81 year old female with history of trauma after falling onto a hard surface floor. Headache. Altered mental status.  EXAM: CT HEAD WITHOUT CONTRAST  CT CERVICAL SPINE WITHOUT CONTRAST  TECHNIQUE: Multidetector CT imaging of the head and cervical spine was performed following the standard protocol without intravenous contrast. Multiplanar CT image reconstructions of the cervical spine were also generated.  COMPARISON:  Head CT 02/09/2014. CT of the cervical spine 02/09/2014  FINDINGS: CT HEAD FINDINGS  Moderate cerebral and mild cerebellar atrophy. Patchy and confluent areas of decreased attenuation are noted throughout the deep and periventricular white matter of the cerebral hemispheres bilaterally, compatible with chronic microvascular ischemic disease. Physiologic calcifications of the basal ganglia bilaterally (right greater than left). No acute displaced skull fractures are identified. No acute intracranial abnormality. Specifically, no evidence of acute post-traumatic intracranial hemorrhage, no definite regions of acute/subacute cerebral ischemia, no focal mass, mass effect, hydrocephalus or abnormal intra or extra-axial fluid collections. The visualized paranasal sinuses and mastoids are well pneumatized.  CT CERVICAL SPINE FINDINGS  Mild chronic compression of the anterior aspect of C6 (approximately 30% loss of height) is unchanged compared to prior examinations. No acute displaced cervical spine fracture identified. 3 mm of anterolisthesis of C6 upon C7 (unchanged). Alignment is otherwise anatomic. Prevertebral soft tissues are normal. Multilevel degenerative disc disease and multilevel facet arthropathy, similar to the prior examination. Visualized portions of the upper thorax are unremarkable.  IMPRESSION: 1. No evidence of significant acute traumatic injury to the skull, brain or cervical spine. 2. Moderate  cerebral and mild cerebellar atrophy with chronic microvascular ischemic changes throughout cerebral white matter redemonstrated, similar to prior examinations. 3. Multilevel degenerative disc disease, cervical spondylosis, and chronic compression of C6 (unchanged), similar to prior examinations.   Electronically Signed   By: Vinnie Langton M.D.   On: 02/22/2014 20:41   Ct Cervical Spine Wo Contrast  02/22/2014   CLINICAL DATA:  81 year old female with history of trauma after falling onto a hard surface floor. Headache. Altered mental status.  EXAM: CT HEAD WITHOUT CONTRAST  CT CERVICAL SPINE WITHOUT CONTRAST  TECHNIQUE: Multidetector CT imaging of the head and cervical spine was performed following the standard protocol without intravenous contrast. Multiplanar CT image reconstructions of the cervical spine were also generated.  COMPARISON:  Head CT 02/09/2014. CT of the cervical spine 02/09/2014  FINDINGS: CT HEAD FINDINGS  Moderate cerebral and mild cerebellar atrophy. Patchy and confluent areas of decreased attenuation are noted throughout the deep and periventricular white matter of the cerebral hemispheres bilaterally, compatible with chronic microvascular ischemic disease. Physiologic calcifications of the basal ganglia bilaterally (right greater than left). No acute displaced skull fractures are identified. No acute intracranial abnormality. Specifically, no evidence of acute post-traumatic intracranial hemorrhage, no definite regions of acute/subacute cerebral ischemia, no focal mass, mass effect, hydrocephalus or abnormal intra or extra-axial fluid collections. The visualized paranasal sinuses and mastoids are well pneumatized.  CT CERVICAL SPINE FINDINGS  Mild chronic compression of the anterior aspect of C6 (approximately 30% loss of height) is unchanged compared to prior examinations. No acute displaced cervical spine fracture identified. 3 mm of anterolisthesis of C6 upon C7 (unchanged). Alignment  is otherwise anatomic. Prevertebral soft tissues are normal. Multilevel degenerative disc disease and multilevel facet arthropathy,  similar to the prior examination. Visualized portions of the upper thorax are unremarkable.  IMPRESSION: 1. No evidence of significant acute traumatic injury to the skull, brain or cervical spine. 2. Moderate cerebral and mild cerebellar atrophy with chronic microvascular ischemic changes throughout cerebral white matter redemonstrated, similar to prior examinations. 3. Multilevel degenerative disc disease, cervical spondylosis, and chronic compression of C6 (unchanged), similar to prior examinations.   Electronically Signed   By: Vinnie Langton M.D.   On: 02/22/2014 20:41   Dg Hip Unilat With Pelvis 2-3 Views Right  02/22/2014   CLINICAL DATA:  81 year old female with history of trauma from a fall complaining of right-sided hip pain.  EXAM: DG HIP W/ PELVIS 2-3V*R*  COMPARISON:  No priors.  FINDINGS: AP view of the pelvis demonstrates no acute displaced fracture of the bony pelvic ring. AP and lateral views of the right hip demonstrate a mildly comminuted intertrochanteric hip fracture, mild varus rotation (approximately 10-15 degrees) and some foreshortening with proximal displacement of the distal fracture fragments. The femoral head remains located.  IMPRESSION: 1. Comminuted mildly displaced and angulated intertrochanteric fracture of the right hip, as above.   Electronically Signed   By: Vinnie Langton M.D.   On: 02/22/2014 21:10    Review of Systems  Unable to perform ROS: dementia   Blood pressure 127/59, pulse 75, temperature 99.6 F (37.6 C), temperature source Oral, resp. rate 20, weight 177 lb 4 oz (80.4 kg), SpO2 98 %. Physical Exam Gen. she was afebrile wake up and talk to me but was a poor historian. Gross deformity right lower extremity shortening external rotation. No other deformities in her extremities Mental status awake and easily did not seem  confused but pleasant affect flat Gait could not walk secondary to fracture Neuro no focal neurologic deficits Vascular normal perfusion all 4 extremities good distal pulses no peripheral edema Lymphatic inguinal area is negative for lymphadenopathy Extremities upper extremities nontender normal range of motion no contracture subluxation atrophy or tremor  Left lower extremity normal  Right lower extremity externally rotated knee and ankle were normal, attempted pull maneuver of the right hip produced extreme pain tenderness over the proximal greater trochanter thigh groin area. Muscle tone normal.  Assessment/Plan: Right peritrochanteric hip fracture 81 year old female household ambulator with walker and assistance. She would benefit from stabilization of the fracture and early mobilization.  Recommend open treatment internal fixation right hip dynamic hip screw  Arther Abbott 02/23/2014, 9:39 AM

## 2014-02-23 NOTE — Transfer of Care (Signed)
Immediate Anesthesia Transfer of Care Note  Patient: Laura Savage  Procedure(s) Performed: Procedure(s): OPEN TREATMENT INTERNAL FIXATION RIGHT HIP (Right)  Patient Location: PACU  Anesthesia Type:Spinal  Level of Consciousness: awake, alert , patient cooperative and confused  Airway & Oxygen Therapy: Patient Spontanous Breathing and Patient connected to nasal cannula oxygen  Post-op Assessment: Report given to PACU RN and Post -op Vital signs reviewed and stable  Post vital signs: Reviewed and stable  Complications: No apparent anesthesia complications

## 2014-02-23 NOTE — Anesthesia Procedure Notes (Addendum)
Date/Time: 02/23/2014 3:07 PM Performed by: Pernell DupreADAMS, Ivy Puryear A Pre-anesthesia Checklist: Patient identified, Timeout performed, Suction available, Emergency Drugs available and Patient being monitored Oxygen Delivery Method: Simple face mask   Spinal Patient location during procedure: OR Start time: 02/23/2014 3:25 PM Preanesthetic Checklist Completed: patient identified, site marked, surgical consent, pre-op evaluation, timeout performed, IV checked, risks and benefits discussed and monitors and equipment checked Spinal Block Patient position: right lateral decubitus Prep: Betadine Patient monitoring: heart rate, cardiac monitor, continuous pulse ox and blood pressure Approach: right paramedian Location: L3-4 Injection technique: single-shot Needle Needle type: Spinocan  Needle gauge: 22 G Needle length: 9 cm Assessment Sensory level: T8 Additional Notes ATTEMPTS:1 TRAY ZO:[id number] TRAY EXPIRATION DATE:10/2014

## 2014-02-23 NOTE — Brief Op Note (Addendum)
02/22/2014 - 02/23/2014  5:30 PM  PATIENT:  Laura Savage  81 y.o. female  PRE-OPERATIVE DIAGNOSIS:  Right intertrochanteric hip fracture   POST-OPERATIVE DIAGNOSIS:  Right intertrochanteric hip fracture   PROCEDURE:  Procedure(s): OPEN TREATMENT INTERNAL FIXATION RIGHT HIP (Right)   Implants Katrinka BlazingSmith nephew dynamic hip screw 125 mm lag screw 135 4-hole plate 42 mm 4.5 cortical screw 38 mm, 2 x 36 mm, screws 19 mm compression screw  Surgical findings two-part inotropic fracture with vertical fracture at the level of the lesser trochanter  Details of procedure  The patient was identified in the preop holding area the right hip confirmed as surgical site. Site marked. Chart review. Patient takes a the operating room given 2 g of Ancef IV. Spinal anesthetic was administered. Patient placed on fracture table. Left leg abducted. Right leg placed in traction. C-arm brought in for closed reduction. Traction and internal rotation reduced the fracture. Fracture confirmed to be reduced and stable on AP and lateral x-ray.  ChloraPrep was used to prep the leg. Sterile draping. Timeout completed.  The incision was started at the tip of the trochanter and carried distally. Subcutaneous tissue divided down to fascia. Subcutaneous bleeders coagulated with cautery. Fascia was split in line with the skin incision. The vastus lateralis musculature and fascia was split down to bone followed by subperiosteal dissection. A 135 angle guide was used to place the pin in the center of the femoral head. This was performed with the assistance of the C-arm. Acceptable tip apex distance was confirmed with C-arm.  Guidepin was measured 125 mm. Triple reamer was set for 125 mm and the reamer was passed over the guide pin. The lag screw was passed over the guide pin the guidepin was removed the plate was attached to the bone using AO technique and 4 cortical screws as described above. C-arm x-rays confirmed plate position  and fracture reduction. Both were acceptable.  Bleeding was controlled with electrocautery. The wound was irrigated the fascia of the vastus lateralis was closed with 0 Monocryl suture 30 mL of Marcaine was injected and this layer. The wound was irrigated again. #1 Bralon was used to close the fascia. Second 30 mL of Marcaine was injected. 0 Monocryl suture was used to close the subcutaneous tissue. Skin was reapproximated with staples.  Sterile bandage was applied   SURGEON:  Surgeon(s) and Role:    * Vickki HearingStanley E Breckyn Troyer, MD - Primary  PHYSICIAN ASSISTANT:   ASSISTANTS: none   ANESTHESIA:   spinal  EBL:  Total I/O In: 1300 [I.V.:1300] Out: 300 [Urine:100; Blood:200]  BLOOD ADMINISTERED:none  DRAINS: none   LOCAL MEDICATIONS USED:  MARCAINE   , Amount: 60 ml and OTHER w/ epi  SPECIMEN:  No Specimen  DISPOSITION OF SPECIMEN:  N/A  COUNTS:  YES  TOURNIQUET:  * No tourniquets in log *  DICTATION: .Dragon Dictation  PLAN OF CARE: Admit to inpatient   PATIENT DISPOSITION:  PACU - hemodynamically stable.   Delay start of Pharmacological VTE agent (>24hrs) due to surgical blood loss or risk of bleeding: yes

## 2014-02-23 NOTE — Progress Notes (Signed)
[facility] [place] patient has a living will but does not have a DNR form. Patient presumed as full code. MD notified of POA's Waverly Ferrari(Vinson Price) contact information. Will continue to monitor.

## 2014-02-23 NOTE — Anesthesia Preprocedure Evaluation (Addendum)
Anesthesia Evaluation  Patient identified by MRN, date of birth, ID band Patient confused  General Assessment Comment:Patient is oriented person, confused but pleasant.; Spoke with son, Celine MansVince Price over phone to verify information found in chart.  Reviewed: Allergy & Precautions, H&P , NPO status , Patient's Chart, lab work & pertinent test results, reviewed documented beta blocker date and time   Airway Mallampati: II  TM Distance: >3 FB Neck ROM: full    Dental  (+) Dental Advidsory Given, Edentulous Upper, Edentulous Lower   Pulmonary neg pulmonary ROS,  breath sounds clear to auscultation Lungs sounds clear, decreased to bases bilaterally        Cardiovascular hypertension, Rhythm:regular Rate:Normal  12 lead EKG shows SR with BBB   Neuro/Psych depression   GI/Hepatic Neg liver ROS, GERD-  Controlled and Medicated,  Endo/Other  negative endocrine ROS  Renal/GU Renal diseaseRenal insufficiency     Musculoskeletal   Abdominal   Peds  Hematology negative hematology ROS (+)   Anesthesia Other Findings Spoke with son and patient,  regarding anesthesia plan; Spinal with General as back up if necessary. Both are agreeable.  Reproductive/Obstetrics negative OB ROS                           Anesthesia Physical Anesthesia Plan  ASA: III and emergent  Anesthesia Plan: Spinal   Post-op Pain Management:    Induction:   Airway Management Planned:   Additional Equipment:   Intra-op Plan:   Post-operative Plan:   Informed Consent: I have reviewed the patients History and Physical, chart, labs and discussed the procedure including the risks, benefits and alternatives for the proposed anesthesia with the patient or authorized representative who has indicated his/her understanding and acceptance.   Dental Advisory Given  Plan Discussed with: Anesthesiologist and Surgeon  Anesthesia Plan  Comments:        Anesthesia Quick Evaluation

## 2014-02-24 LAB — TSH: TSH: 1.995 u[IU]/mL (ref 0.350–4.500)

## 2014-02-24 LAB — CBC
HEMATOCRIT: 29.9 % — AB (ref 36.0–46.0)
Hemoglobin: 9.9 g/dL — ABNORMAL LOW (ref 12.0–15.0)
MCH: 31.4 pg (ref 26.0–34.0)
MCHC: 33.1 g/dL (ref 30.0–36.0)
MCV: 94.9 fL (ref 78.0–100.0)
PLATELETS: 93 10*3/uL — AB (ref 150–400)
RBC: 3.15 MIL/uL — AB (ref 3.87–5.11)
RDW: 14.1 % (ref 11.5–15.5)
WBC: 8.2 10*3/uL (ref 4.0–10.5)

## 2014-02-24 LAB — BASIC METABOLIC PANEL
Anion gap: 6 (ref 5–15)
BUN: 19 mg/dL (ref 6–23)
CALCIUM: 8.5 mg/dL (ref 8.4–10.5)
CHLORIDE: 106 meq/L (ref 96–112)
CO2: 24 mmol/L (ref 19–32)
CREATININE: 1.33 mg/dL — AB (ref 0.50–1.10)
GFR calc Af Amer: 42 mL/min — ABNORMAL LOW (ref >90)
GFR, EST NON AFRICAN AMERICAN: 36 mL/min — AB (ref >90)
Glucose, Bld: 108 mg/dL — ABNORMAL HIGH (ref 70–99)
Potassium: 3.7 mmol/L (ref 3.5–5.1)
SODIUM: 136 mmol/L (ref 135–145)

## 2014-02-24 LAB — VITAMIN B12: VITAMIN B 12: 287 pg/mL (ref 211–911)

## 2014-02-24 MED ORDER — POTASSIUM CHLORIDE IN NACL 20-0.9 MEQ/L-% IV SOLN
INTRAVENOUS | Status: AC
  Administered 2014-02-24 – 2014-02-25 (×2): via INTRAVENOUS

## 2014-02-24 MED ORDER — HALOPERIDOL LACTATE 5 MG/ML IJ SOLN: 1.0000 mg | Freq: Four times a day (QID) | INTRAMUSCULAR | Status: DC | PRN

## 2014-02-24 NOTE — Addendum Note (Signed)
Addendum  created 02/24/14 1214 by Earleen NewportAmy A Enyla Lisbon, CRNA   Modules edited: Notes Section   Notes Section:  File: [id number]

## 2014-02-24 NOTE — Progress Notes (Signed)
  POD # 1 right hip OTIF DHS   VS BP 130/55 mmHg  Pulse 75  Temp(Src) 98.6 F (37 C) (Oral)  Resp 20  Wt 189 lb 9.5 oz (86 kg)  SpO2 92%   LABS  Hemoglobin & Hematocrit     Component Value Date/Time   HGB 9.9* 02/24/2014 0627   HCT 29.9* 02/24/2014 [id number]     DRESSING mild drainage   Neuro-vasculo-motor status of operative limb normal   A/P  Try PT

## 2014-02-24 NOTE — Progress Notes (Signed)
An attempt was made to see pt for initial evaluation this morning.  She was medicated for pain prior to my visit.  She was very drowsy and would awaken only momentarily.  She had moderate edema of the right hip so an ice pack was applied.  This very slight movement of the right leg to position the ice pack caused her severe pain.  RN is aware.  We will defer PT until tomorrow.

## 2014-02-24 NOTE — Progress Notes (Signed)
[facility] PROGRESS NOTE  Laura Kennedyolly K Savage JYN:[id number] DOB: 1929/03/24 DOA: 02/22/2014 PCP: Jeoffrey MassedMCGOWEN,PHILIP H, MD    Code Status: Full code Family Communication: Discussed with son on 02/23/14. Disposition Plan: Likely discharge to skilled nursing facility following hip surgery when appropriate.   Consultants:  Orthopedic surgeon, Dr. Romeo AppleHarrison  Procedures:  02/22/14- ORIF of right intratrochanteric fracture.  Antibiotics:  None  HPI/Subjective: The patient is laying in bed. She has no complaints of pain after being given Vicodin for right hip pain earlier, per nursing.  Objective: Filed Vitals:   02/24/14 1500  BP: 117/47  Pulse: 79  Temp: 99 F (37.2 C)  Resp: 20   oxygen saturation 98%.  Intake/Output Summary (Last 24 hours) at 02/24/14 1611 Last data filed at 02/24/14 0900  Gross per 24 hour  Intake 3608.33 ml  Output    350 ml  Net 3258.33 ml   Filed Weights   02/22/14 2308 02/24/14 0533  Weight: 80.4 kg (177 lb 4 oz) 86 kg (189 lb 9.5 oz)    Exam:   General:  Alert obese 81 year old, in no acute distress.  Cardiovascular: S1, S2, with a soft systolic murmur.  Respiratory: Clear anteriorly with decreased breath sounds in the bases.  Abdomen: Obese, positive bowel sounds, soft, nontender, nondistended.  Musculoskeletal: Right hip with bandage in place,  moderately tender to palpation; trace of pedal edema. No abnormalities of the left lower extremity.  Neurologic: She is alert and oriented to herself, but not to time place or year. She tries to answer questions appropriately.    Data Reviewed: Basic Metabolic Panel:  Recent Labs Lab 02/22/14 2002 02/23/14 0526 02/24/14 0627  NA 140 138 136  K 3.5 4.1 3.7  CL 106 107 106  CO2 27 26 24   GLUCOSE 122* 139* 108*  BUN 24* 24* 19  CREATININE 1.47* 1.51* 1.33*  CALCIUM 9.2 9.0 8.5   Liver Function Tests: No results for input(s): AST, ALT, ALKPHOS, BILITOT, PROT, ALBUMIN in the last 168  hours. No results for input(s): LIPASE, AMYLASE in the last 168 hours. No results for input(s): AMMONIA in the last 168 hours. CBC:  Recent Labs Lab 02/22/14 2002 02/23/14 0526 02/24/14 0627  WBC 10.2 10.3 8.2  NEUTROABS 7.5  --   --   HGB 13.0 12.1 9.9*  HCT 40.5 37.2 29.9*  MCV 95.1 95.1 94.9  PLT 148* 139* 93*   Cardiac Enzymes: No results for input(s): CKTOTAL, CKMB, CKMBINDEX, TROPONINI in the last 168 hours. BNP (last 3 results) No results for input(s): PROBNP in the last 8760 hours. CBG: No results for input(s): GLUCAP in the last 168 hours.  Recent Results (from the past 240 hour(s))  Surgical pcr screen     Status: None   Collection Time: 02/23/14 12:16 AM  Result Value Ref Range Status   MRSA, PCR NEGATIVE NEGATIVE Final   Staphylococcus aureus NEGATIVE NEGATIVE Final    Comment:        The Xpert SA Assay (FDA approved for NASAL specimens in patients over 79 years of age), is one component of a comprehensive surveillance program.  Test performance has been validated by [facility] for patients greater than or equal to 79 year old. It is not intended to diagnose infection nor to guide or monitor treatment.      Studies: Ct Head Wo Contrast  02/22/2014   CLINICAL DATA:  81 year old female with history of trauma after falling onto a hard surface floor. Headache. Altered mental status.  EXAM: CT HEAD WITHOUT CONTRAST  CT CERVICAL SPINE WITHOUT CONTRAST  TECHNIQUE: Multidetector CT imaging of the head and cervical spine was performed following the standard protocol without intravenous contrast. Multiplanar CT image reconstructions of the cervical spine were also generated.  COMPARISON:  Head CT 02/09/2014. CT of the cervical spine 02/09/2014  FINDINGS: CT HEAD FINDINGS  Moderate cerebral and mild cerebellar atrophy. Patchy and confluent areas of decreased attenuation are noted throughout the deep and periventricular white matter of the cerebral hemispheres  bilaterally, compatible with chronic microvascular ischemic disease. Physiologic calcifications of the basal ganglia bilaterally (right greater than left). No acute displaced skull fractures are identified. No acute intracranial abnormality. Specifically, no evidence of acute post-traumatic intracranial hemorrhage, no definite regions of acute/subacute cerebral ischemia, no focal mass, mass effect, hydrocephalus or abnormal intra or extra-axial fluid collections. The visualized paranasal sinuses and mastoids are well pneumatized.  CT CERVICAL SPINE FINDINGS  Mild chronic compression of the anterior aspect of C6 (approximately 30% loss of height) is unchanged compared to prior examinations. No acute displaced cervical spine fracture identified. 3 mm of anterolisthesis of C6 upon C7 (unchanged). Alignment is otherwise anatomic. Prevertebral soft tissues are normal. Multilevel degenerative disc disease and multilevel facet arthropathy, similar to the prior examination. Visualized portions of the upper thorax are unremarkable.  IMPRESSION: 1. No evidence of significant acute traumatic injury to the skull, brain or cervical spine. 2. Moderate cerebral and mild cerebellar atrophy with chronic microvascular ischemic changes throughout cerebral white matter redemonstrated, similar to prior examinations. 3. Multilevel degenerative disc disease, cervical spondylosis, and chronic compression of C6 (unchanged), similar to prior examinations.   Electronically Signed   By: Trudie Reed M.D.   On: 02/22/2014 20:41   Ct Cervical Spine Wo Contrast  02/22/2014   CLINICAL DATA:  81 year old female with history of trauma after falling onto a hard surface floor. Headache. Altered mental status.  EXAM: CT HEAD WITHOUT CONTRAST  CT CERVICAL SPINE WITHOUT CONTRAST  TECHNIQUE: Multidetector CT imaging of the head and cervical spine was performed following the standard protocol without intravenous contrast. Multiplanar CT image  reconstructions of the cervical spine were also generated.  COMPARISON:  Head CT 02/09/2014. CT of the cervical spine 02/09/2014  FINDINGS: CT HEAD FINDINGS  Moderate cerebral and mild cerebellar atrophy. Patchy and confluent areas of decreased attenuation are noted throughout the deep and periventricular white matter of the cerebral hemispheres bilaterally, compatible with chronic microvascular ischemic disease. Physiologic calcifications of the basal ganglia bilaterally (right greater than left). No acute displaced skull fractures are identified. No acute intracranial abnormality. Specifically, no evidence of acute post-traumatic intracranial hemorrhage, no definite regions of acute/subacute cerebral ischemia, no focal mass, mass effect, hydrocephalus or abnormal intra or extra-axial fluid collections. The visualized paranasal sinuses and mastoids are well pneumatized.  CT CERVICAL SPINE FINDINGS  Mild chronic compression of the anterior aspect of C6 (approximately 30% loss of height) is unchanged compared to prior examinations. No acute displaced cervical spine fracture identified. 3 mm of anterolisthesis of C6 upon C7 (unchanged). Alignment is otherwise anatomic. Prevertebral soft tissues are normal. Multilevel degenerative disc disease and multilevel facet arthropathy, similar to the prior examination. Visualized portions of the upper thorax are unremarkable.  IMPRESSION: 1. No evidence of significant acute traumatic injury to the skull, brain or cervical spine. 2. Moderate cerebral and mild cerebellar atrophy with chronic microvascular ischemic changes throughout cerebral white matter redemonstrated, similar to prior examinations. 3. Multilevel degenerative disc disease, cervical spondylosis, and chronic  compression of C6 (unchanged), similar to prior examinations.   Electronically Signed   By: Trudie Reed M.D.   On: 02/22/2014 20:41   Dg Hip Operative Unilat With Pelvis Right  02/23/2014   CLINICAL  DATA:  81 year old female with history of intertrochanteric right hip fracture. ORIF.  EXAM: OPERATIVE RIGHT HIP WITH PELVIS  COMPARISON:  02/22/2014.  FINDINGS: 6 intraoperative fluoroscopic spot views of the right hip demonstrate interval reduction of the previously noted comminuted intertrochanteric hip fracture, and placement of a dynamic compression screw fixation device, which appears properly located, restoring anatomic alignment. No acute complicating features.  IMPRESSION: 1. Intraoperative documentation of ORIF for comminuted intertrochanteric hip fracture of the right hip, as above.   Electronically Signed   By: Trudie Reed M.D.   On: 02/23/2014 19:17   Dg Hip Unilat With Pelvis 2-3 Views Right  02/22/2014   CLINICAL DATA:  81 year old female with history of trauma from a fall complaining of right-sided hip pain.  EXAM: DG HIP W/ PELVIS 2-3V*R*  COMPARISON:  No priors.  FINDINGS: AP view of the pelvis demonstrates no acute displaced fracture of the bony pelvic ring. AP and lateral views of the right hip demonstrate a mildly comminuted intertrochanteric hip fracture, mild varus rotation (approximately 10-15 degrees) and some foreshortening with proximal displacement of the distal fracture fragments. The femoral head remains located.  IMPRESSION: 1. Comminuted mildly displaced and angulated intertrochanteric fracture of the right hip, as above.   Electronically Signed   By: Trudie Reed M.D.   On: 02/22/2014 21:10    Scheduled Meds: . albuterol  3 mL Inhalation BID  . ALPRAZolam  0.5 mg Oral BID  . aspirin EC  325 mg Oral Q breakfast  . docusate sodium  100 mg Oral BID  . donepezil  5 mg Oral QHS  . multivitamin with minerals  1 tablet Oral Daily  . polyethylene glycol  17 g Oral Daily  . senna  1 tablet Oral BID  . sertraline  100 mg Oral Daily  . traMADol  50 mg Oral 4 times per day   Continuous Infusions: . 0.9 % NaCl with KCl 20 mEq / L 75 mL/hr at 02/24/14 1346   Assessment  and plan: Principal Problem:   Intertrochanteric fracture of right hip Active Problems:   Fall at nursing home   GERD (gastroesophageal reflux disease)   Dysphagia   Chronic renal insufficiency, stage III (moderate)   HTN (hypertension), benign   Thrombocytopenia   Hyperglycemia   Obesity, morbid   Chronic dementia   Intertrochanteric fracture of right femur   1. Acute comminuted mildly displaced and angulated intertrochanteric fracture of the right hip, status post fall at the nursing home. Postoperative day #1, ORIF by Dr. Romeo Apple. Continue analgesics and supportive treatment as needed. Postoperative wound management and DVT prophylaxis and therapy per Dr. Romeo Apple.  Hypertension. Currently stable and within normal limits. We'll restart losartan-hydrochlorothiazide tomorrow if her blood pressure becomes hypertensive.  GERD with a history of dysphagia. Continue PPI. Dysphagia 2 diet started.  Chronic kidney disease, stage III. Her creatinine appears to be in the range of stage III chronic kidney disease.  Continue gentle IV fluids.  Thrombocytopenia. Etiology is unknown. Her platelet count has decreased, in part, because of the delusional effects of IV fluids. Her TSH is within normal limits.  Hyperglycemia. This is mild and may be secondary to possible dextrose given in the  previous IV fluids. We'll order hemoglobin A1c and monitor. Vitamin  B12 level is pending for evaluation.  Anemia secondary to acute perioperative blood loss. Her hemoglobin was 13 on admission, likely hemoconcentrated. With gentle IV fluids and perioperative blood loss, her hemoglobin has fallen to 9.9. We'll continue to monitor for the need for transfusion.   Chronic dementia with depression and anxiety. We'll restart Aricept, Zoloft, and Xanax when she is not nothing by mouth. We'll add when necessary Haldol for possible sundowning.   Time spent: 30 minutes    [facility]  [facility] Pager [phone]. If 7PM-7AM, please contact night-coverage at [email or web address], password [facility] 02/24/2014, 4:11 PM  LOS: 2 days

## 2014-02-24 NOTE — Anesthesia Postprocedure Evaluation (Signed)
  Anesthesia Post-op Note  Patient: Laura Savage  Procedure(s) Performed: Procedure(s): OPEN TREATMENT INTERNAL FIXATION RIGHT HIP (Right)  Patient Location: Room 313  Anesthesia Type:Spinal  Level of Consciousness: awake, patient cooperative and confused  Airway and Oxygen Therapy: Patient Spontanous Breathing and Patient connected to nasal cannula oxygen  Post-op Pain: mild  Post-op Assessment: Post-op Vital signs reviewed, Patient's Cardiovascular Status Stable, Respiratory Function Stable, Patent Airway and Pain level controlled  Post-op Vital Signs: Reviewed and stable  Last Vitals:  Filed Vitals:   02/24/14 0800  BP:   Pulse:   Temp:   Resp: 20    Complications: No apparent anesthesia complications

## 2014-02-25 ENCOUNTER — Encounter (HOSPITAL_COMMUNITY): Payer: Self-pay | Admitting: Internal Medicine

## 2014-02-25 ENCOUNTER — Telehealth: Payer: Self-pay | Admitting: Orthopedic Surgery

## 2014-02-25 LAB — BASIC METABOLIC PANEL
Anion gap: 4 — ABNORMAL LOW (ref 5–15)
BUN: 16 mg/dL (ref 6–23)
CALCIUM: 8.6 mg/dL (ref 8.4–10.5)
CHLORIDE: 108 meq/L (ref 96–112)
CO2: 25 mmol/L (ref 19–32)
Creatinine, Ser: 1.31 mg/dL — ABNORMAL HIGH (ref 0.50–1.10)
GFR calc Af Amer: 43 mL/min — ABNORMAL LOW (ref >90)
GFR calc non Af Amer: 37 mL/min — ABNORMAL LOW (ref >90)
Glucose, Bld: 111 mg/dL — ABNORMAL HIGH (ref 70–99)
Potassium: 4.3 mmol/L (ref 3.5–5.1)
SODIUM: 137 mmol/L (ref 135–145)

## 2014-02-25 LAB — CBC
HCT: 29.2 % — ABNORMAL LOW (ref 36.0–46.0)
Hemoglobin: 9.7 g/dL — ABNORMAL LOW (ref 12.0–15.0)
MCH: 31.8 pg (ref 26.0–34.0)
MCHC: 33.2 g/dL (ref 30.0–36.0)
MCV: 95.7 fL (ref 78.0–100.0)
Platelets: 101 10*3/uL — ABNORMAL LOW (ref 150–400)
RBC: 3.05 MIL/uL — AB (ref 3.87–5.11)
RDW: 14.2 % (ref 11.5–15.5)
WBC: 9.5 10*3/uL (ref 4.0–10.5)

## 2014-02-25 LAB — HEMOGLOBIN A1C
HEMOGLOBIN A1C: 5.7 % — AB (ref <5.7)
Mean Plasma Glucose: 117 mg/dL — ABNORMAL HIGH (ref <117)

## 2014-02-25 MED ORDER — METHOCARBAMOL 500 MG PO TABS
500.0000 mg | ORAL_TABLET | Freq: Two times a day (BID) | ORAL | Status: DC
  Administered 2014-02-25 – 2014-02-26 (×3): 500 mg via ORAL
  Filled 2014-02-25 (×3): qty 1

## 2014-02-25 MED ORDER — ACETAMINOPHEN 325 MG PO TABS
650.0000 mg | ORAL_TABLET | Freq: Two times a day (BID) | ORAL | Status: DC
  Administered 2014-02-25: 650 mg via ORAL
  Filled 2014-02-25: qty 2

## 2014-02-25 MED ORDER — DEXTROSE 5 % IV SOLN
500.0000 mg | Freq: Two times a day (BID) | INTRAVENOUS | Status: DC
  Filled 2014-02-25 (×5): qty 5

## 2014-02-25 MED ORDER — ACETAMINOPHEN 325 MG PO TABS
650.0000 mg | ORAL_TABLET | Freq: Three times a day (TID) | ORAL | Status: DC
  Administered 2014-02-25 – 2014-02-26 (×2): 650 mg via ORAL
  Filled 2014-02-25 (×2): qty 2

## 2014-02-25 MED ORDER — PANTOPRAZOLE SODIUM 40 MG PO TBEC
40.0000 mg | DELAYED_RELEASE_TABLET | Freq: Every day | ORAL | Status: DC
  Administered 2014-02-25 – 2014-02-26 (×2): 40 mg via ORAL
  Filled 2014-02-25: qty 1

## 2014-02-25 MED ORDER — MORPHINE SULFATE 2 MG/ML IJ SOLN: 0.5000 mg | INTRAMUSCULAR | Status: DC | PRN

## 2014-02-25 MED ORDER — HYDROCODONE-ACETAMINOPHEN 5-325 MG PO TABS
1.0000 | ORAL_TABLET | Freq: Four times a day (QID) | ORAL | Status: DC | PRN
  Administered 2014-02-25 – 2014-02-26 (×2): 1 via ORAL
  Filled 2014-02-25 (×2): qty 1

## 2014-02-25 MED ORDER — MORPHINE SULFATE 2 MG/ML IJ SOLN
0.5000 mg | INTRAMUSCULAR | Status: DC | PRN
  Administered 2014-02-26 (×3): 0.5 mg via INTRAVENOUS
  Filled 2014-02-25 (×3): qty 1

## 2014-02-25 NOTE — Progress Notes (Signed)
[facility] PROGRESS NOTE  Laura Savage:[id number] DOB: January 08, 1930 DOA: 02/22/2014 PCP: Jeoffrey Massed, MD  Assessment/Plan: Intertrochanteric fracture of right hip: related to mechanical fall. S/P ORIF . Post op day #2. Continues with extreme pain with movement. Will provide scheduled tylenol and scheduled robaxin with Norco for breakthrough. Discussed at length with sons importance of mobility and strategies to manage pain.   Active Problems: Thrombocytopenia: etiology uncertain. TSH and B12 within the limits of normal. May be dilutional. Will decrease IV rate.     Hyperglycemia: trending down. No hx diabetes. hgA1c 5.7. Continue to monitor  Chronic renal insufficiency, stage III (moderate). Appears creatinine baseline range 1.6-1.6. Currently creatinine 1.3. Urine output good. Monitor    HTN (hypertension), benign: controlled. Home meds include losartan and HCTZ. Plan to resume as indicated. Decrease IV fluid rate.    GERD (gastroesophageal reflux disease): stable at baseline. Resume PPI      Chronic dementia: appears stable at baseline. Continue home medication   Code Status: full Family Communication: sons at bedside Disposition Plan: facility   Consultants:  Romeo Apple orthopedics  Procedures:  ORIF 02/23/14  Antibiotics:  none  HPI/Subjective: Smiling. Denies pain but screams if attempt to move her.   Objective: Filed Vitals:   02/25/14 1211  BP: 138/57  Pulse: 73  Temp: 97.8 F (36.6 C)  Resp: 18    Intake/Output Summary (Last 24 hours) at 02/25/14 1330 Last data filed at 02/25/14 0700  Gross per 24 hour  Intake 1532.5 ml  Output      0 ml  Net 1532.5 ml   Filed Weights   02/22/14 2308 02/24/14 0533  Weight: 80.4 kg (177 lb 4 oz) 86 kg (189 lb 9.5 oz)    Exam:   General:  Well nourished NAD  Cardiovascular: RRR +murmur no gallup no LE edema  Respiratory: normal effort BS clear bilaterally no wheeze  Abdomen: obese soft +BS  non-tender to palpation  Musculoskeletal: right hip dressing intact. Otherwise joints without swelling/erythema  Neuro: oriented to self only. Unable to make wants and needs known, attempts to follow commands   Data Reviewed: Basic Metabolic Panel:  Recent Labs Lab 02/22/14 2002 02/23/14 0526 02/24/14 0627 02/25/14 0622  NA 140 138 136 137  K 3.5 4.1 3.7 4.3  CL 106 107 106 108  CO2 GLUCOSE 122* 139* 108* 111*  BUN 24* 24* 19 16  CREATININE 1.47* 1.51* 1.33* 1.31*  CALCIUM 9.2 9.0 8.5 8.6   Liver Function Tests: No results for input(s): AST, ALT, ALKPHOS, BILITOT, PROT, ALBUMIN in the last 168 hours. No results for input(s): LIPASE, AMYLASE in the last 168 hours. No results for input(s): AMMONIA in the last 168 hours. CBC:  Recent Labs Lab 02/22/14 2002 02/23/14 0526 02/24/14 0627 02/25/14 0622  WBC 10.2 10.3 8.2 9.5  NEUTROABS 7.5  --   --   --   HGB 13.0 12.1 9.9* 9.7*  HCT 40.5 37.2 29.9* 29.2*  MCV 95.1 95.1 94.9 95.7  PLT 148* 139* 93* 101*   Cardiac Enzymes: No results for input(s): CKTOTAL, CKMB, CKMBINDEX, TROPONINI in the last 168 hours. BNP (last 3 results) No results for input(s): PROBNP in the last 8760 hours. CBG: No results for input(s): GLUCAP in the last 168 hours.  Recent Results (from the past 240 hour(s))  Surgical pcr screen     Status: None   Collection Time: 02/23/14 12:16 AM  Result Value Ref Range Status   MRSA,  PCR NEGATIVE NEGATIVE Final   Staphylococcus aureus NEGATIVE NEGATIVE Final    Comment:        The Xpert SA Assay (FDA approved for NASAL specimens in patients over 79 years of age), is one component of a comprehensive surveillance program.  Test performance has been validated by [facility] for patients greater than or equal to 79 year old. It is not intended to diagnose infection nor to guide or monitor treatment.      Studies: Dg Hip Operative Unilat With Pelvis Right  02/23/2014   CLINICAL DATA:   81 year old female with history of intertrochanteric right hip fracture. ORIF.  EXAM: OPERATIVE RIGHT HIP WITH PELVIS  COMPARISON:  02/22/2014.  FINDINGS: 6 intraoperative fluoroscopic spot views of the right hip demonstrate interval reduction of the previously noted comminuted intertrochanteric hip fracture, and placement of a dynamic compression screw fixation device, which appears properly located, restoring anatomic alignment. No acute complicating features.  IMPRESSION: 1. Intraoperative documentation of ORIF for comminuted intertrochanteric hip fracture of the right hip, as above.   Electronically Signed   By: Trudie Reedaniel  Entrikin Savage.D.   On: 02/23/2014 19:17    Scheduled Meds: . acetaminophen  650 mg Oral Q12H  . albuterol  3 mL Inhalation BID  . ALPRAZolam  0.5 mg Oral BID  . aspirin EC  325 mg Oral Q breakfast  . docusate sodium  100 mg Oral BID  . donepezil  5 mg Oral QHS  . methocarbamol  500 mg Oral Q12H   Or  . methocarbamol (ROBAXIN)  IV  500 mg Intravenous Q12H  . multivitamin with minerals  1 tablet Oral Daily  . polyethylene glycol  17 g Oral Daily  . senna  1 tablet Oral BID  . sertraline  100 mg Oral Daily  . traMADol  50 mg Oral 4 times per day   Continuous Infusions: . 0.9 % NaCl with KCl 20 mEq / L 75 mL/hr at 02/25/14 0232    Principal Problem:      Time spent: 35 mintues    [facility],Melana Hingle Savage  [facility] Pager [phone]. If 7PM-7AM, please contact night-coverage at [email or web address], password [email or web address] 02/25/2014, 1:30 PM  LOS: 3 days

## 2014-02-25 NOTE — Progress Notes (Signed)
MD notified about bladder scan and wants to wait the complete 8hrs to see if pt will void before inserting I&O cath. Will continue to monitor

## 2014-02-25 NOTE — Progress Notes (Signed)
  POD # 2  VS BP 112/45 mmHg  Pulse 83  Temp(Src) 98 F (36.7 C) (Oral)  Resp 14  Wt 189 lb 9.5 oz (86 kg)  SpO2 97%   LABS  Hemoglobin & Hematocrit     Component Value Date/Time   HGB 9.7* 02/25/2014 0622   HCT 29.2* 02/25/2014 0622     DRESSING minimal scant drainage   Neuro-vasculo-motor status of operative limb normal  A/P  Skilled nursing facility discharge when available

## 2014-02-25 NOTE — Clinical Social Work Note (Signed)
[facility] Facility notified. CSW left voicemail notifying [facility]([employer]) of need for authorization.  Derenda FennelKara Alroy [place], [place] [phone]

## 2014-02-25 NOTE — Care Management Utilization Note (Signed)
UR completed 

## 2014-02-25 NOTE — Care Management Note (Signed)
    Page 1 of 1   02/25/2014     10:41:46 AM CARE MANAGEMENT NOTE 02/25/2014  Patient:  Laura Savage,Laura Savage   Account Number:  [id number]  Date Initiated:  02/25/2014  Documentation initiated by:  Kathyrn SheriffHILDRESS,JESSICA  Subjective/Objective Assessment:   Pt is from [facility]. Pt admitted for hip fx. Pt plans to discharge to SNF for rehab, CSW aware of discharge plan and will arrange for placement. No CM needs.     Action/Plan:   Anticipated DC Date:  02/25/2014   Anticipated DC Plan:  SKILLED NURSING FACILITY  In-house referral  Clinical Social Worker         Choice offered to / List presented to:             Status of service:  Completed, signed off Medicare Important Message given?  YES (If response is "NO", the following Medicare IM given date fields will be blank) Date Medicare IM given:  02/25/2014 Medicare IM given by:  Kathyrn SheriffHILDRESS,JESSICA Date Additional Medicare IM given:   Additional Medicare IM given by:    Discharge Disposition:  SKILLED NURSING FACILITY  Per UR Regulation:    If discussed at Long Length of Stay Meetings, dates discussed:    Comments:  02/25/2014 1030 Kathyrn SheriffJessica Childress, RN, MSN, [employer]

## 2014-02-25 NOTE — Progress Notes (Signed)
NT reported that pt has not voided since foley catheter removal this AM. Bladder scan performed and present in bladder. Will notify MD and do bladder scan if ordered

## 2014-02-25 NOTE — Evaluation (Signed)
Physical Therapy Evaluation Patient Details Name: Laura Savage MRN: [id number] DOB: April 11, 1929 Today's Date: 02/25/2014   History of Present Illness  Patient who resided in memory unit of SNF who sustained R hip fracture after fall out of WC; PMH including dementia, OA, HTN, depression, renal insufficiency, insomnia, LBBB, L TKA  Clinical Impression  Pt presented supine in bed with son present; attempted to perform AAROM through knee flexion, ABD, ADD with injured R leg, however patient quickly became resistant to attempts possibly due to combination of high level of pain and pre-existing dementia. Significant edema and increased temperature noted R leg, and pt did experience pain even with light touch of leg. Supine to sit with Max(A)x2 however patient very resistant to mobility and demonstrated strong posterior lean during transfer and while sitting at EOB for approximately 10 seconds. Sit to supine with Max(A) x2, repositioning in bed with Max(A)x2. PT did provided family education to son regarding potential DC plans and stated that at this time pt needs high level of assist and will likely be most appropriate for SNF placement; family gave verbal agreement to and understanding of education. Pt left supine in bed with all needs met and bed alarm on.     Follow Up Recommendations SNF    Equipment Recommendations  None recommended by PT    Recommendations for Other Services OT consult     Precautions / Restrictions Precautions Precautions: Fall Precaution Comments: Limited by pain Restrictions Weight Bearing Restrictions: No      Mobility  Bed Mobility Overal bed mobility: +2 for physical assistance             General bed mobility comments: Limited by pain and dementia, poor participation and command following  Transfers Overall transfer level: Needs assistance Equipment used: None             General transfer comment:  (Unable to attempt due to pain, agitation  secondary to pain/dementia)  Ambulation/Gait             General Gait Details: Unable to assess due to poor command following, inability to tolerate sitting at EOB due to pain  Stairs            Wheelchair Mobility    Modified Rankin (Stroke Patients Only)       Balance                                             Pertinent Vitals/Pain Pain Assessment: 0-10 Pain Score: 10-Worst pain ever Pain Location: R leg Pain Descriptors / Indicators: Constant;Sharp Pain Intervention(s): Limited activity within patient's tolerance;Other (comment) (nursing staff notified)    Home Living Family/patient expects to be discharged to:: Skilled nursing facility                      Prior Function Level of Independence: Needs assistance   Gait / Transfers Assistance Needed: Transfers and ambulation with FWW, assist x1           Hand Dominance        Extremity/Trunk Assessment               Lower Extremity Assessment: Difficult to assess due to impaired cognition         Communication   Communication: Other (comment) (Dementia, confusion present)  Cognition Arousal/Alertness: Awake/alert (Limited due to dementia) Behavior During  Therapy: Agitated Overall Cognitive Status: History of cognitive impairments - at baseline (Dementia)       Memory: Decreased short-term memory;Decreased recall of precautions              General Comments General comments (skin integrity, edema, etc.): Significant edema and increased temperature noted R LE, 10/10 pain with light touch and pt did begin to become agitated with mobility, possibly secondary to combination of dementia and pain    Exercises        Assessment/Plan    PT Assessment Patient needs continued PT services  PT Diagnosis Acute pain;Generalized weakness;Difficulty walking   PT Problem List Decreased strength;Decreased cognition;Decreased range of motion;Decreased activity  tolerance;Decreased safety awareness;Decreased skin integrity;Decreased balance;Pain;Decreased mobility;Cardiopulmonary status limiting activity;Decreased coordination  PT Treatment Interventions Balance training;Gait training;Neuromuscular re-education;Functional mobility training;Patient/family education;Therapeutic activities;Wheelchair mobility training;Therapeutic exercise   PT Goals (Current goals can be found in the Care Plan section) Acute Rehab PT Goals Patient Stated Goal: None stated PT Goal Formulation: With family Time For Goal Achievement: 03/11/14 Potential to Achieve Goals: Fair    Frequency Min 3X/week   Barriers to discharge Other (comment) (Poor ability to return to ALF environment due to impairments )      Co-evaluation               End of Session Equipment Utilized During Treatment: Oxygen Activity Tolerance: Patient limited by pain;Treatment limited secondary to agitation (Limited in command following secondary to dementia) Patient left: in bed;with call bell/phone within reach;with bed alarm set Nurse Communication: Mobility status;Need for lift equipment         Time: 6073-7106 PT Time Calculation (min) (ACUTE ONLY): 35 min   Charges:   PT Evaluation $Initial PT Evaluation Tier I: 1 Procedure     PT G CodesHunt Oris 02/25/2014, 9:37 AM

## 2014-02-25 NOTE — Progress Notes (Signed)
NT reports pt did void this afternoon after having foley removed this AM

## 2014-02-25 NOTE — Evaluation (Signed)
Occupational Therapy Evaluation Patient Details Name: Laura Savage MRN: [id number] DOB: [birth year] Today's Date: 02/25/2014    History of Present Illness Patient who resided in memory unit of SNF who sustained R hip fracture after fall out of WC; PMH including dementia, OA, HTN, depression, renal insufficiency, insomnia, LBBB, L TKA   Clinical Impression   Pt is presenting to acute OT with above situation.  She has dementia subsequent decreased awareness of current situation.  Significant pain and decreased cognitive status limit pt's current participation in OT evaluation.  Son was able to provide history that pt was receiving assist with all ADLs in memory care unit.  Son's hopes for pt to be able to return to memory care unit after SNF, but is concerned about possibility.  BUE strength and ROM is WFL.  Pt will benefit from SNF OT for attempts at re-engagement in dressing and toileting ADLs.      Follow Up Recommendations  SNF    Equipment Recommendations  Other (comment) (Defer to SNF)    Recommendations for Other Services       Precautions / Restrictions Precautions Precautions: Fall Precaution Comments: Limited by pain, dementia Restrictions Weight Bearing Restrictions: No      Mobility Bed Mobility Overal bed mobility: +2 for physical assistance             General bed mobility comments: Limited by pain and dementia, poor participation and command following  Transfers Overall transfer level: Needs assistance Equipment used: None             General transfer comment:  (Unable to attempt due to pain, agitation secondary to pain/dementia)    Balance                                            ADL Overall ADL's : Needs assistance/impaired Eating/Feeding: Set up                       Toilet Transfer: Total assistance   Toileting- Clothing Manipulation and Hygiene: Total assistance               Vision                     Perception     Praxis      Pertinent Vitals/Pain Pain Assessment: Faces Pain Score: 10-Worst pain ever Faces Pain Scale: Hurts even more Pain Location: R leg Pain Descriptors / Indicators: Constant;Sharp Pain Intervention(s): Limited activity within patient's tolerance;Other (comment) (nursing staff notified)     Hand Dominance Right   Extremity/Trunk Assessment Upper Extremity Assessment Upper Extremity Assessment: Overall WFL for tasks assessed;Difficult to assess due to impaired cognition   Lower Extremity Assessment Lower Extremity Assessment: Difficult to assess due to impaired cognition       Communication Communication Communication: Other (comment) (dementia)   Cognition Arousal/Alertness: Awake/alert Behavior During Therapy: WFL for tasks assessed/performed Overall Cognitive Status: History of cognitive impairments - at baseline       Memory: Decreased short-term memory;Decreased recall of precautions             General Comments       Exercises       Shoulder Instructions      Home Living Family/patient expects to be discharged to:: Skilled nursing facility  Additional Comments: memory care unit.  Son provides assist as needed.  Husband passed away in November 08, 2022.      Prior Functioning/Environment Level of Independence: Needs assistance  Gait / Transfers Assistance Needed: Transfers and ambulation with FWW, assist x1 ADL's / Homemaking Assistance Needed: per son, pt was receiving assist with all dressinga nd bathing tasks.  Was able to feed herself.  Was able to transfer to toilet with assist and RW from [facility].        OT Diagnosis: Acute pain (decreased ADL status)   OT Problem List: Pain;Impaired balance (sitting and/or standing);Decreased activity tolerance;Decreased safety awareness;Decreased knowledge of precautions;Decreased knowledge of use of DME or AE (Decreased ADL status)   OT  Treatment/Interventions: Self-care/ADL training;Therapeutic exercise;Patient/family education;Therapeutic activities;DME and/or AE instruction    OT Goals(Current goals can be found in the care plan section) Acute Rehab OT Goals Patient Stated Goal: Son: for pt to be able to return to prior functioning at memory care unit OT Goal Formulation: With family Time For Goal Achievement: 03/11/14 Potential to Achieve Goals: Fair  OT Frequency: Min 2X/week   Barriers to D/C:            Co-evaluation              End of Session    Activity Tolerance: Patient tolerated treatment well;Patient limited by pain Patient left: in bed;with family/visitor present;with call bell/phone within reach;with bed alarm set   Time: 8119-1478 OT Time Calculation (min): 20 min Charges:  OT General Charges $OT Visit: 1 Procedure OT Evaluation $Initial OT Evaluation Tier I: 1 Procedure G-Codes:     Marry Guan Thresia Ramanathan, MS, OTR/L [facility] Rehabilitation [phone] 02/25/2014, 10:00 AM

## 2014-02-25 NOTE — Clinical Social Work Placement (Signed)
Clinical Social Work Department CLINICAL SOCIAL WORK PLACEMENT NOTE 02/25/2014  Patient:  Bella KennedyRBY,Jericka K  Account Number:  [id number] Admit date:  02/22/2014  Clinical Social Worker:  Derenda FennelKARA Hrithik Boschee, LCSW  Date/time:  02/25/2014 10:54 AM  Clinical Social Work is seeking post-discharge placement for this patient at the following level of care:   SKILLED NURSING   (*CSW will update this form in Epic as items are completed)   02/25/2014  Patient/family provided with [facility] Department of Clinical Social Work's list of facilities offering this level of care within the geographic area requested by the patient (or if unable, by the patient's family).  02/25/2014  Patient/family informed of their freedom to choose among providers that offer the needed level of care, that participate in Medicare, Medicaid or managed care program needed by the patient, have an available bed and are willing to accept the patient.  02/25/2014  Patient/family informed of [facility]' ownership interest in [facility], as well as of the fact that they are under no obligation to receive care at this facility.  PASARR submitted to EDS on 02/25/2014 PASARR number received on 02/25/2014  FL2 transmitted to all facilities in geographic area requested by pt/family on  02/25/2014 FL2 transmitted to all facilities within larger geographic area on   Patient informed that his/her managed care company has contracts with or will negotiate with  certain facilities, including the following:     Patient/family informed of bed offers received:   Patient chooses bed at  Physician recommends and patient chooses bed at    Patient to be transferred to  on   Patient to be transferred to facility by  Patient and family notified of transfer on  Name of family member notified:    The following physician request were entered in Epic:   Additional Comments:  Derenda FennelKara Modesty Rudy, LCSW [phone]

## 2014-02-25 NOTE — Clinical Social Work Placement (Signed)
Clinical Social Work Department CLINICAL SOCIAL WORK PLACEMENT NOTE 02/25/2014  Patient:  Laura Savage,Laura Savage  Account Number:  [id number] Admit date:  02/22/2014  Clinical Social Worker:  Derenda FennelKARA Sallyanne Birkhead, LCSW  Date/time:  02/25/2014 10:54 AM  Clinical Social Work is seeking post-discharge placement for this patient at the following level of care:   SKILLED NURSING   (*CSW will update this form in Epic as items are completed)   02/25/2014  Patient/family provided with [facility] Department of Clinical Social Work's list of facilities offering this level of care within the geographic area requested by the patient (or if unable, by the patient's family).  02/25/2014  Patient/family informed of their freedom to choose among providers that offer the needed level of care, that participate in Medicare, Medicaid or managed care program needed by the patient, have an available bed and are willing to accept the patient.  02/25/2014  Patient/family informed of [facility] ownership interest in [facility], as well as of the fact that they are under no obligation to receive care at this facility.  PASARR submitted to EDS on 02/25/2014 PASARR number received on 02/25/2014  FL2 transmitted to all facilities in geographic area requested by pt/family on  02/25/2014 FL2 transmitted to all facilities within larger geographic area on   Patient informed that his/her managed care company has contracts with or will negotiate with  certain facilities, including the following:     Patient/family informed of bed offers received:  02/25/2014 [facility] Physician recommends and patient chooses bed at    Patient to be transferred to  on   Patient to be transferred to facility by  Patient and family notified of transfer on  Name of family member notified:    The following physician request were entered in Epic:   Additional Comments:  Derenda FennelKara Santiago Graf,  LCSW [phone]

## 2014-02-25 NOTE — Clinical Social Work Psychosocial (Signed)
Clinical Social Work Department BRIEF PSYCHOSOCIAL ASSESSMENT 02/25/2014  Patient:  Laura Savage, Laura Savage     Account Number:  [id number]     Admit date:  02/22/2014  Clinical Social Worker:  Wyatt Haste  Date/Time:  02/25/2014 10:55 AM  Referred by:  Physician  Date Referred:  02/25/2014 Referred for  SNF Placement   Other Referral:   Interview type:  Patient Other interview type:   son- Vince    PSYCHOSOCIAL DATA Living Status:  FACILITY Admitted from facility:  Other Level of care:  Assisted Living Primary support name:  Vince Primary support relationship to patient:  CHILD, ADULT Degree of support available:   supportive    CURRENT CONCERNS Current Concerns  Post-Acute Placement   Other Concerns:    SOCIAL WORK ASSESSMENT / PLAN CSW met with pt and pt's son, Vince at bedside. Pt alert, but oriented to self only. Vince reports pt has been a resident at [facility] for the past year. She was doing well there, but had several falls the last month. Facility was encouraging pt to use wheelchair more. She is admitted after a fall with hip fracture. Pt's son appears to be very involved and supportive. He lives in [place] and visits pt regularly. Sharee Pimple is relieved surgery is behind them, but is concerned about the future due to dementia and pt's ability to work with therapy. CSW provided support. Discussed SNF placement process and son is agreeable to [place], [place], or [place]. He understands that [facility] will have to require authorization for SNF. SNF list provided.   Assessment/plan status:  Psychosocial Support/Ongoing Assessment of Needs Other assessment/ plan:   Information/referral to community resources:   SNF list    PATIENT'S/FAMILY'S RESPONSE TO PLAN OF CARE: Pt's son agreeable to SNF for rehab and will consider long term options when appropriate. CSW will initiate bed search and insurance authorization.       Benay Pike,  [place]

## 2014-02-25 NOTE — Progress Notes (Signed)
Orthopedic instructions  Right hip fracture.  Internal fixation with compression hip screw   Date of surgery 02/23/2014  Ecotrin for one month  Staples out postop day 14  Follow-up postop day 28  Weightbearing as tolerated

## 2014-02-26 DIAGNOSIS — D62 Acute posthemorrhagic anemia: Secondary | ICD-10-CM | POA: Diagnosis not present

## 2014-02-26 DIAGNOSIS — K219 Gastro-esophageal reflux disease without esophagitis: Secondary | ICD-10-CM

## 2014-02-26 LAB — TYPE AND SCREEN
ABO/RH(D): A NEG
Antibody Screen: NEGATIVE
UNIT DIVISION: 0
Unit division: 0
Unit division: 0

## 2014-02-26 LAB — BASIC METABOLIC PANEL
ANION GAP: 5 (ref 5–15)
BUN: 18 mg/dL (ref 6–23)
CHLORIDE: 106 meq/L (ref 96–112)
CO2: 27 mmol/L (ref 19–32)
CREATININE: 1.32 mg/dL — AB (ref 0.50–1.10)
Calcium: 9 mg/dL (ref 8.4–10.5)
GFR calc Af Amer: 42 mL/min — ABNORMAL LOW (ref >90)
GFR, EST NON AFRICAN AMERICAN: 36 mL/min — AB (ref >90)
GLUCOSE: 108 mg/dL — AB (ref 70–99)
Potassium: 4.1 mmol/L (ref 3.5–5.1)
Sodium: 138 mmol/L (ref 135–145)

## 2014-02-26 LAB — CBC
HEMATOCRIT: 29.9 % — AB (ref 36.0–46.0)
HEMOGLOBIN: 9.7 g/dL — AB (ref 12.0–15.0)
MCH: 31.4 pg (ref 26.0–34.0)
MCHC: 32.4 g/dL (ref 30.0–36.0)
MCV: 96.8 fL (ref 78.0–100.0)
Platelets: 113 10*3/uL — ABNORMAL LOW (ref 150–400)
RBC: 3.09 MIL/uL — ABNORMAL LOW (ref 3.87–5.11)
RDW: 14.2 % (ref 11.5–15.5)
WBC: 8.9 10*3/uL (ref 4.0–10.5)

## 2014-02-26 MED ORDER — METHOCARBAMOL 500 MG PO TABS: 500.0000 mg | ORAL_TABLET | Freq: Two times a day (BID) | ORAL | Status: AC

## 2014-02-26 MED ORDER — POLYETHYLENE GLYCOL 3350 17 G PO PACK: 17.0000 g | PACK | Freq: Every day | ORAL | Status: AC

## 2014-02-26 MED ORDER — BISACODYL 5 MG PO TBEC: 5.0000 mg | DELAYED_RELEASE_TABLET | Freq: Every day | ORAL | Status: AC | PRN

## 2014-02-26 MED ORDER — ASPIRIN 325 MG PO TBEC: 325.0000 mg | DELAYED_RELEASE_TABLET | Freq: Every day | ORAL | Status: AC

## 2014-02-26 MED ORDER — DSS 100 MG PO CAPS: 100.0000 mg | ORAL_CAPSULE | Freq: Two times a day (BID) | ORAL | Status: AC

## 2014-02-26 MED ORDER — ALPRAZOLAM 1 MG PO TABS: ORAL_TABLET | ORAL | Status: AC

## 2014-02-26 MED ORDER — DONEPEZIL HCL 5 MG PO TABS: ORAL_TABLET | ORAL | Status: AC

## 2014-02-26 MED ORDER — GUAIFENESIN 400 MG PO TABS: 400.0000 mg | ORAL_TABLET | Freq: Three times a day (TID) | ORAL | Status: AC | PRN

## 2014-02-26 MED ORDER — ACETAMINOPHEN 325 MG PO TABS: 650.0000 mg | ORAL_TABLET | Freq: Three times a day (TID) | ORAL | Status: AC

## 2014-02-26 MED ORDER — HYDROCODONE-ACETAMINOPHEN 5-325 MG PO TABS: 1.0000 | ORAL_TABLET | Freq: Four times a day (QID) | ORAL | Status: DC | PRN

## 2014-02-26 NOTE — Progress Notes (Signed)
Occupational Therapy Treatment Patient Details Name: Laura Savage MRN: [id number] DOB: Dec 28, 1929 Today's Date: 02/26/2014    History of present illness Patient who resided in memory unit of SNF who sustained R hip fracture after fall out of WC; PMH including dementia, OA, HTN, depression, renal insufficiency, insomnia, LBBB, L TKA   OT comments  Attempted there-ex at bed level of UE, but pt had increased difficulty following commands (attempted to attach thera-band to bed rather than stretch it).  Engaged pt in bed-level ADL tasks.  Pt ws set-up with grooming tasks and mod assist with UB dressing.  Pt would require max assist with LB dressing, it pt is able to tolerate. Pt continues with increased pain.  D/C plan to SNF remains appropriate.   Follow Up Recommendations  SNF    Equipment Recommendations  Other (comment) (Defer to SNF)    Recommendations for Other Services      Precautions / Restrictions Precautions Precautions: Fall Precaution Comments: Limited by pain, dementia Restrictions Weight Bearing Restrictions: No       Mobility Bed Mobility Overal bed mobility: Needs Assistance Bed Mobility:  (leaning forward in bed)           General bed mobility comments: lmited by pain and dementia.  Mod assist to lean forward (during dressing task).  Transfers                      Balance                                   ADL       Grooming: Brushing hair;Set up           Upper Body Dressing : Moderate assistance;Bed level Upper Body Dressing Details (indicate cue type and reason): cueing for finding arm, head holes.  assist to pull gown over head, assist to pull gown down Lower Body Dressing: Maximal assistance Lower Body Dressing Details (indicate cue type and reason): Pt did not particiapte in pulling gown down beyond chest level.   due to pain, pt will have significant pain with all LE dressing at this time.                       Vision                     Perception     Praxis      Cognition   Behavior During Therapy: WFL for tasks assessed/performed Overall Cognitive Status: History of cognitive impairments - at baseline       Memory: Decreased short-term memory;Decreased recall of precautions               Extremity/Trunk Assessment               Exercises     Shoulder Instructions       General Comments      Pertinent Vitals/ Pain       Pain Assessment: Faces Faces Pain Scale: Hurts even more Pain Location: Right leg Pain Intervention(s): Limited activity within patient's tolerance;Monitored during session  Home Living                                          Prior Functioning/Environment  Frequency Min 2X/week     Progress Toward Goals  OT Goals(current goals can now be found in the care plan section)  Progress towards OT goals: Progressing toward goals  Acute Rehab OT Goals Patient Stated Goal: Son: for pt to be able to return to prior functioning at memory care unit OT Goal Formulation: With family Time For Goal Achievement: 03/11/14 Potential to Achieve Goals: Fair ADL Goals Pt Will Perform Lower Body Dressing: with mod assist;with max assist Pt Will Transfer to Toilet: with max assist Pt/caregiver will Perform Home Exercise Program: Increased strength;Both right and left upper extremity  Plan Discharge plan remains appropriate    Co-evaluation                 End of Session     Activity Tolerance Patient tolerated treatment well;Patient limited by pain   Patient Left in bed;with family/visitor present;with call bell/phone within reach;with bed alarm set   Nurse Communication          Time: 2130-8657 OT Time Calculation (min): 21 min  Charges: OT General Charges $OT Visit: 1 Procedure OT Treatments $Self Care/Home Management : 8-22 mins   Laura Guan Aiden Rao, MS, OTR/L [facility] [phone] 02/26/2014, 11:13 AM

## 2014-02-26 NOTE — Progress Notes (Signed)
  POD # 3  VS BP 119/47 mmHg  Pulse 81  Temp(Src) 98.1 F (36.7 C) (Oral)  Resp 20  Ht 5\' 7"  (1.702 m)  Wt 188 lb (85.276 kg)  BMI 29.44 kg/m2  SpO2 90%   LABS  Hemoglobin & Hematocrit     Component Value Date/Time   HGB 9.7* 02/26/2014 0651   HCT 29.9* 02/26/2014 0651     DRESSING min scant sang drainage   Neuro-vasculo-motor status of operative limb normal   A/P discharge to snf

## 2014-02-26 NOTE — Clinical Social Work Placement (Signed)
Clinical Social Work Department CLINICAL SOCIAL WORK PLACEMENT NOTE 02/26/2014  Patient:  Laura Savage,Laura Savage  Account Number:  [id number] Admit date:  02/22/2014  Clinical Social Worker:  Derenda FennelKARA Sherley Mckenney, LCSW  Date/time:  02/25/2014 10:54 AM  Clinical Social Work is seeking post-discharge placement for this patient at the following level of care:   SKILLED NURSING   (*CSW will update this form in Epic as items are completed)   02/25/2014  Patient/family provided with [facility] Department of Clinical Social Work's list of facilities offering this level of care within the geographic area requested by the patient (or if unable, by the patient's family).  02/25/2014  Patient/family informed of their freedom to choose among providers that offer the needed level of care, that participate in Medicare, Medicaid or managed care program needed by the patient, have an available bed and are willing to accept the patient.  02/25/2014  Patient/family informed of [facility] ownership interest in [facility], as well as of the fact that they are under no obligation to receive care at this facility.  PASARR submitted to EDS on 02/25/2014 PASARR number received on 02/25/2014  FL2 transmitted to all facilities in geographic area requested by pt/family on  02/25/2014 FL2 transmitted to all facilities within larger geographic area on   Patient informed that his/her managed care company has contracts with or will negotiate with  certain facilities, including the following:     Patient/family informed of bed offers received:  02/25/2014 [facility] Physician recommends and patient chooses bed at    [facility] on  02/26/2014 [facility] be [facility] Patient and family notified of transfer on 02/26/2014 Name of family member notified:  son- Vince  The following physician request  were entered in Epic:   Additional Comments:  Derenda FennelKara Nomar Broad, [place] [phone]

## 2014-02-26 NOTE — Clinical Social Work Note (Signed)
[facility]. Pt's son, Gloris ManchesterVince and facility aware and agreeable. [facility]Kelly [facility]. CSW discussed transportation with Gloris ManchesterVince and he [facility]. D/C summary faxed. Pt had further questions regarding financials at SNF. CSW recommended that pt follow [facility] with [facility] for details.   Derenda FennelKara Kriston Mckinnie, [place] [phone]

## 2014-02-26 NOTE — Discharge Summary (Signed)
Physician Discharge Summary  Laura Savage ZOX:[id number] DOB: [birth year] DOA: 02/22/2014  PCP: Jeoffrey Massed, MD  Admit date: 02/22/2014 Discharge date: 02/26/2014  Time spent: 40 minutes  Recommendations for Outpatient Follow-up:  1. Follow up with dr Romeo Apple 03/23/14  2. Discharge to [facility] 3. Staples to be removed 03/09/14 4. Recommend CBC 1 week to track Hg and platlets  Discharge Diagnoses:  Principal Problem:   Intertrochanteric fracture of right hip Active Problems:   GERD (gastroesophageal reflux disease)   Dysphagia   Chronic renal insufficiency, stage III (moderate)   HTN (hypertension), benign   Fall at nursing home   Thrombocytopenia   Hyperglycemia   Obesity, morbid   Chronic dementia   Intertrochanteric fracture of right femur   Discharge Condition: stable  Diet recommendation: dysphagia 2 thin liquids with assist  Filed Weights   02/22/14 2308 02/24/14 0533 02/25/14 1300  Weight: 80.4 kg (177 lb 4 oz) 86 kg (189 lb 9.5 oz) 85.276 kg (188 lb)    History of present illness:  81 yo female fell out of her wheelchair on 02/22/14 at her SNF.she had pain in right hip. Pt does not recall events due to severe dementia. History obtained from ed staff and doctor and ems.  Hospital Course:  Intertrochanteric fracture of right hip: related to mechanical fall. S/P ORIF on 02/23/14. Pain management challenging due to severe dementia. Has improved with scheduled tylenol and robaxin using norco for breakthrough. Recommend continuing scheduled tylenol and robaxin for 3 days more after discharge. Staples to be removed 03/09/14. Follow up with Dr Romeo Apple 03/23/14. Weight bearing as tolerated.    Active Problems: Thrombocytopenia: etiology uncertain. TSH and B12 within the limits of normal. May be dilutional. No s/sx bleeding. Trending up at discharge. Recommend CBC 1 week.     Hyperglycemia: mild. No hx diabetes. hgA1c 5.7.   Chronic renal insufficiency, stage III  (moderate). Creatinine at baseline at discharge.  Urine output good.    HTN (hypertension), benign: controlled. Home meds include losartan and HCTZ.    GERD (gastroesophageal reflux disease): stable at baseline. PPI   Chronic dementia: stable at baseline.    Procedures:  ORIF 02/23/14  Consultations:  Dr Romeo Apple orthopedics  Discharge Exam: Filed Vitals:   02/26/14 0800  BP:   Pulse:   Temp:   Resp: 18    General: well nourished will smile when engaged, some facial grimacing with movement Cardiovascular: RRR +murmur i hear no gallup, no LE edema Respiratory: normal effort but slightly shallow, BS distant but clear. i hear no wheeze or crackles MS: staples to right hip dry and intact.   Discharge Instructions    Current Discharge Medication List    START taking these medications   Details  acetaminophen (TYLENOL) 325 MG tablet Take 2 tablets (650 mg total) by mouth every 8 (eight) hours. For 3 more days.    bisacodyl (DULCOLAX) 5 MG EC tablet Take 1 tablet (5 mg total) by mouth daily as needed for moderate constipation. Qty: 30 tablet, Refills: 0    docusate sodium 100 MG CAPS Take 100 mg by mouth 2 (two) times daily. Qty: 10 capsule, Refills: 0    HYDROcodone-acetaminophen (NORCO/VICODIN) 5-325 MG per tablet Take 1 tablet by mouth every 6 (six) hours as needed for moderate pain. Qty: 30 tablet, Refills: 0    methocarbamol (ROBAXIN) 500 MG tablet Take 1 tablet (500 mg total) by mouth every 12 (twelve) hours. For 3 more days.    polyethylene glycol (  MIRALAX / GLYCOLAX) packet Take 17 g by mouth daily. Qty: 14 each, Refills: 0      CONTINUE these medications which have CHANGED   Details  ALPRAZolam (XANAX) 1 MG tablet 1/2 tab po q 12 NOON and 1 tab po qhs Qty: 45 tablet, Refills: 5    aspirin EC 325 MG EC tablet Take 1 tablet (325 mg total) by mouth daily with breakfast. For 30 days then resume 81mg  daily Qty: 30 tablet, Refills: 0    donepezil  (ARICEPT) 5 MG tablet 1 tab po qhs Qty: 30 tablet, Refills: 3    guaifenesin (HUMIBID E) 400 MG TABS tablet Take 1 tablet (400 mg total) by mouth every 8 (eight) hours as needed (for cough). Qty: 56 tablet, Refills: 0   Associated Diagnoses: Respiratory illness with fever      CONTINUE these medications which have NOT CHANGED   Details  albuterol (PROVENTIL HFA;VENTOLIN HFA) 108 (90 BASE) MCG/ACT inhaler Inhale 2 puffs into the lungs 2 (two) times daily. Qty: 1 Inhaler, Refills: 0   Associated Diagnoses: Respiratory illness with fever    fexofenadine (ALLEGRA) 60 MG tablet Take 1 tablet (60 mg total) by mouth 2 (two) times daily. Qty: 60 tablet, Refills: 3    losartan-hydrochlorothiazide (HYZAAR) 50-12.5 MG per tablet Take 1 tablet by mouth daily.    Multiple Vitamin (MULTIVITAMIN WITH MINERALS) TABS tablet Take 1 tablet by mouth daily.    omeprazole (PRILOSEC) 40 MG capsule Take 1 capsule (40 mg total) by mouth daily. Qty: 90 capsule, Refills: 1    potassium chloride SA (K-DUR,KLOR-CON) 20 MEQ tablet Take 1 tablet (20 mEq total) by mouth daily. Qty: 90 tablet, Refills: 1    sertraline (ZOLOFT) 100 MG tablet Take 1 tablet (100 mg total) by mouth daily. Qty: 90 tablet, Refills: 1    Multiple Vitamins-Minerals (MULTIVITAMIN & MINERAL PO) Take 1 tablet by mouth daily. [facility] [employer].    Probiotic Product (ALIGN) 4 MG CAPS 1cap PO at lunch daily for 1 month. Qty: 30 capsule, Refills: 0   Associated Diagnoses: Antibiotic long-term use      STOP taking these medications     meloxicam (MOBIC) 7.5 MG tablet        No Known Allergies    The results of significant diagnostics from this hospitalization (including imaging, microbiology, ancillary and laboratory) are listed below for reference.    Significant Diagnostic Studies: Dg Chest 2 View  02/09/2014   CLINICAL DATA:  Patient fell today with chest pain, initial encounter  EXAM: CHEST  2 VIEW  COMPARISON:  02/12/2013   FINDINGS: Cardiac shadow is mildly enlarged. A calcified granuloma is noted in the left lung base. The lungs are otherwise clear. Degenerative changes of the thoracic spine are noted.  IMPRESSION: Prior granulomatous disease.  No acute abnormality is noted.   Electronically Signed   By: Alcide CleverMark  Lukens M.D.   On: 02/09/2014 20:03   Dg Pelvis 1-2 Views  02/09/2014   CLINICAL DATA:  Larey SeatFell from wheelchair this afternoon, initial encounter  EXAM: PELVIS - 1-2 VIEW  COMPARISON:  None.  FINDINGS: Mild degenerative changes of the hip joints are noted bilaterally. No acute fracture or dislocation is seen. No soft tissue changes are noted.  IMPRESSION: No acute abnormality noted.   Electronically Signed   By: Alcide CleverMark  Lukens M.D.   On: 02/09/2014 20:04   Ct Head Wo Contrast  02/22/2014   CLINICAL DATA:  81 year old female with history of trauma after falling onto  a hard surface floor. Headache. Altered mental status.  EXAM: CT HEAD WITHOUT CONTRAST  CT CERVICAL SPINE WITHOUT CONTRAST  TECHNIQUE: Multidetector CT imaging of the head and cervical spine was performed following the standard protocol without intravenous contrast. Multiplanar CT image reconstructions of the cervical spine were also generated.  COMPARISON:  Head CT 02/09/2014. CT of the cervical spine 02/09/2014  FINDINGS: CT HEAD FINDINGS  Moderate cerebral and mild cerebellar atrophy. Patchy and confluent areas of decreased attenuation are noted throughout the deep and periventricular white matter of the cerebral hemispheres bilaterally, compatible with chronic microvascular ischemic disease. Physiologic calcifications of the basal ganglia bilaterally (right greater than left). No acute displaced skull fractures are identified. No acute intracranial abnormality. Specifically, no evidence of acute post-traumatic intracranial hemorrhage, no definite regions of acute/subacute cerebral ischemia, no focal mass, mass effect, hydrocephalus or abnormal intra or  extra-axial fluid collections. The visualized paranasal sinuses and mastoids are well pneumatized.  CT CERVICAL SPINE FINDINGS  Mild chronic compression of the anterior aspect of C6 (approximately 30% loss of height) is unchanged compared to prior examinations. No acute displaced cervical spine fracture identified. 3 mm of anterolisthesis of C6 upon C7 (unchanged). Alignment is otherwise anatomic. Prevertebral soft tissues are normal. Multilevel degenerative disc disease and multilevel facet arthropathy, similar to the prior examination. Visualized portions of the upper thorax are unremarkable.  IMPRESSION: 1. No evidence of significant acute traumatic injury to the skull, brain or cervical spine. 2. Moderate cerebral and mild cerebellar atrophy with chronic microvascular ischemic changes throughout cerebral white matter redemonstrated, similar to prior examinations. 3. Multilevel degenerative disc disease, cervical spondylosis, and chronic compression of C6 (unchanged), similar to prior examinations.   Electronically Signed   By: Trudie Reed M.D.   On: 02/22/2014 20:41   Ct Head Wo Contrast  02/09/2014   CLINICAL DATA:  Fall from wheelchair, initial encounter  EXAM: CT HEAD WITHOUT CONTRAST  CT CERVICAL SPINE WITHOUT CONTRAST  TECHNIQUE: Multidetector CT imaging of the head and cervical spine was performed following the standard protocol without intravenous contrast. Multiplanar CT image reconstructions of the cervical spine were also generated.  COMPARISON:  01/05/2014  FINDINGS: CT HEAD FINDINGS  Mucosal thickening in the right maxillary antrum is noted which is chronic in nature. The bony calvarium is intact. Diffuse atrophic changes are noted. No findings to suggest acute hemorrhage, acute infarction or space-occupying mass lesion are noted.  CT CERVICAL SPINE FINDINGS  Seven cervical segments are well visualized. Vertebral body height is well maintained. Multilevel osteophytic changes and facet  hypertrophic changes are seen. No acute fracture or acute facet abnormality is noted. The surrounding soft tissues and visualized lung apices are within normal limits. Generalized osteopenia is noted.  IMPRESSION: CT of the head: Chronic atrophic changes without acute abnormality.  CT of the cervical spine: Multilevel degenerative change without acute abnormality.   Electronically Signed   By: Alcide Clever M.D.   On: 02/09/2014 20:18   Ct Cervical Spine Wo Contrast  02/22/2014   CLINICAL DATA:  81 year old female with history of trauma after falling onto a hard surface floor. Headache. Altered mental status.  EXAM: CT HEAD WITHOUT CONTRAST  CT CERVICAL SPINE WITHOUT CONTRAST  TECHNIQUE: Multidetector CT imaging of the head and cervical spine was performed following the standard protocol without intravenous contrast. Multiplanar CT image reconstructions of the cervical spine were also generated.  COMPARISON:  Head CT 02/09/2014. CT of the cervical spine 02/09/2014  FINDINGS: CT HEAD FINDINGS  Moderate  cerebral and mild cerebellar atrophy. Patchy and confluent areas of decreased attenuation are noted throughout the deep and periventricular white matter of the cerebral hemispheres bilaterally, compatible with chronic microvascular ischemic disease. Physiologic calcifications of the basal ganglia bilaterally (right greater than left). No acute displaced skull fractures are identified. No acute intracranial abnormality. Specifically, no evidence of acute post-traumatic intracranial hemorrhage, no definite regions of acute/subacute cerebral ischemia, no focal mass, mass effect, hydrocephalus or abnormal intra or extra-axial fluid collections. The visualized paranasal sinuses and mastoids are well pneumatized.  CT CERVICAL SPINE FINDINGS  Mild chronic compression of the anterior aspect of C6 (approximately 30% loss of height) is unchanged compared to prior examinations. No acute displaced cervical spine fracture  identified. 3 mm of anterolisthesis of C6 upon C7 (unchanged). Alignment is otherwise anatomic. Prevertebral soft tissues are normal. Multilevel degenerative disc disease and multilevel facet arthropathy, similar to the prior examination. Visualized portions of the upper thorax are unremarkable.  IMPRESSION: 1. No evidence of significant acute traumatic injury to the skull, brain or cervical spine. 2. Moderate cerebral and mild cerebellar atrophy with chronic microvascular ischemic changes throughout cerebral white matter redemonstrated, similar to prior examinations. 3. Multilevel degenerative disc disease, cervical spondylosis, and chronic compression of C6 (unchanged), similar to prior examinations.   Electronically Signed   By: Trudie Reed M.D.   On: 02/22/2014 20:41   Ct Cervical Spine Wo Contrast  02/09/2014   CLINICAL DATA:  Fall from wheelchair, initial encounter  EXAM: CT HEAD WITHOUT CONTRAST  CT CERVICAL SPINE WITHOUT CONTRAST  TECHNIQUE: Multidetector CT imaging of the head and cervical spine was performed following the standard protocol without intravenous contrast. Multiplanar CT image reconstructions of the cervical spine were also generated.  COMPARISON:  01/05/2014  FINDINGS: CT HEAD FINDINGS  Mucosal thickening in the right maxillary antrum is noted which is chronic in nature. The bony calvarium is intact. Diffuse atrophic changes are noted. No findings to suggest acute hemorrhage, acute infarction or space-occupying mass lesion are noted.  CT CERVICAL SPINE FINDINGS  Seven cervical segments are well visualized. Vertebral body height is well maintained. Multilevel osteophytic changes and facet hypertrophic changes are seen. No acute fracture or acute facet abnormality is noted. The surrounding soft tissues and visualized lung apices are within normal limits. Generalized osteopenia is noted.  IMPRESSION: CT of the head: Chronic atrophic changes without acute abnormality.  CT of the cervical  spine: Multilevel degenerative change without acute abnormality.   Electronically Signed   By: Alcide Clever M.D.   On: 02/09/2014 20:18   Dg Knee Complete 4 Views Left  02/09/2014   CLINICAL DATA:  Fall from wheelchair with knee pain, initial encounter  EXAM: LEFT KNEE - COMPLETE 4+ VIEW  COMPARISON:  None.  FINDINGS: There are changes consistent with a prior knee prosthesis on the left. No acute fracture or dislocation is noted. No definitive loosening is seen.  IMPRESSION: No acute abnormality is noted.  Prior knee prosthesis is seen.   Electronically Signed   By: Alcide Clever M.D.   On: 02/09/2014 20:05   Dg Knee Complete 4 Views Right  02/09/2014   CLINICAL DATA:  Fall from wheelchair with knee pain, initial encounter  EXAM: RIGHT KNEE - COMPLETE 4+ VIEW  COMPARISON:  None.  FINDINGS: Degenerative changes are noted with narrowing of the medial joint space and osteophytic change. No acute effusion is seen. No fracture or dislocation is noted. Patellofemoral degenerative changes are noted as well.  IMPRESSION: Chronic  changes without acute abnormality.   Electronically Signed   By: Alcide Clever M.D.   On: 02/09/2014 20:06   Dg Hip Operative Unilat With Pelvis Right  02/23/2014   CLINICAL DATA:  81 year old female with history of intertrochanteric right hip fracture. ORIF.  EXAM: OPERATIVE RIGHT HIP WITH PELVIS  COMPARISON:  02/22/2014.  FINDINGS: 6 intraoperative fluoroscopic spot views of the right hip demonstrate interval reduction of the previously noted comminuted intertrochanteric hip fracture, and placement of a dynamic compression screw fixation device, which appears properly located, restoring anatomic alignment. No acute complicating features.  IMPRESSION: 1. Intraoperative documentation of ORIF for comminuted intertrochanteric hip fracture of the right hip, as above.   Electronically Signed   By: Trudie Reed M.D.   On: 02/23/2014 19:17   Dg Hip Unilat With Pelvis 2-3 Views  Right  02/22/2014   CLINICAL DATA:  81 year old female with history of trauma from a fall complaining of right-sided hip pain.  EXAM: DG HIP W/ PELVIS 2-3V*R*  COMPARISON:  No priors.  FINDINGS: AP view of the pelvis demonstrates no acute displaced fracture of the bony pelvic ring. AP and lateral views of the right hip demonstrate a mildly comminuted intertrochanteric hip fracture, mild varus rotation (approximately 10-15 degrees) and some foreshortening with proximal displacement of the distal fracture fragments. The femoral head remains located.  IMPRESSION: 1. Comminuted mildly displaced and angulated intertrochanteric fracture of the right hip, as above.   Electronically Signed   By: Trudie Reed M.D.   On: 02/22/2014 21:10    Microbiology: Recent Results (from the past 240 hour(s))  Surgical pcr screen     Status: None   Collection Time: 02/23/14 12:16 AM  Result Value Ref Range Status   MRSA, PCR NEGATIVE NEGATIVE Final   Staphylococcus aureus NEGATIVE NEGATIVE Final    Comment:        The Xpert SA Assay (FDA approved for NASAL specimens in patients over 23 years of age), is one component of a comprehensive surveillance program.  Test performance has been validated by [employer] for patients greater than or equal to 72 year old. It is not intended to diagnose infection nor to guide or monitor treatment.      Labs: Basic Metabolic Panel:  Recent Labs Lab 02/22/14 2002 02/23/14 0526 02/24/14 0627 02/25/14 0622 02/26/14 0651  NA 140 138 136 137 138  K 3.5 4.1 3.7 4.3 4.1  CL 106 107 106 108 106  CO2 27 26 24 25 27   GLUCOSE 122* 139* 108* 111* 108*  BUN 24* 24* 19 16 18   CREATININE 1.47* 1.51* 1.33* 1.31* 1.32*  CALCIUM 9.2 9.0 8.5 8.6 9.0   Liver Function Tests: No results for input(s): AST, ALT, ALKPHOS, BILITOT, PROT, ALBUMIN in the last 168 hours. No results for input(s): LIPASE, AMYLASE in the last 168 hours. No results for input(s): AMMONIA in the last 168  hours. CBC:  Recent Labs Lab 02/22/14 2002 02/23/14 0526 02/24/14 0627 02/25/14 0622 02/26/14 0651  WBC 10.2 10.3 8.2 9.5 8.9  NEUTROABS 7.5  --   --   --   --   HGB 13.0 12.1 9.9* 9.7* 9.7*  HCT 40.5 37.2 29.9* 29.2* 29.9*  MCV 95.1 95.1 94.9 95.7 96.8  PLT 148* 139* 93* 101* 113*   Cardiac Enzymes: No results for input(s): CKTOTAL, CKMB, CKMBINDEX, TROPONINI in the last 168 hours. BNP: BNP (last 3 results) No results for input(s): PROBNP in the last 8760 hours. CBG: No results for  input(s): GLUCAP in the last 168 hours.     SignedGwenyth Bender  [facility] 02/26/2014, 12:32 PM

## 2014-02-26 NOTE — Care Management Utilization Note (Signed)
UR completed 

## 2014-02-26 NOTE — Progress Notes (Signed)
Physical Therapy Treatment Patient Details Name: Laura Savage MRN: [id number] DOB: [birth year] Today's Date: 02/26/2014    History of Present Illness Patient who resided in memory unit of SNF who sustained R hip fracture after fall out of WC; PMH including dementia, OA, HTN, depression, renal insufficiency, insomnia, LBBB, L TKA.  She underwent OTIF of the right hip on 02-23-13.    PT Comments    Pt is alert and more cooperative today.  She was medicated for pain with morphine prior to PT visit and she did not report any pain at rest.  She was able to tolerate very slow and gentle limited ROM of both LEs, great care given to simple explanations.  She was able to follow very simple directions 50% of the time.  With very slow and great effort, we were able to transfer her to the edge of bed with nearly total assist.  She refrains from sitting on the left hip due to discomfort so needs full support to maintain sitting.  She tolerated sitting for about 10 minutes at edge of bed.  She was returned to supine and was left in the care of nursing to provide a bedpan.  Overall, she tolerated PT visit much better than yesterday.  Follow Up Recommendations     SNF   Equipment Recommendations    none   Recommendations for Other Services  OT     Precautions / Restrictions Precautions Precautions: Fall Precaution Comments: Limited by pain, dementia Restrictions Weight Bearing Restrictions: No    Mobility  Bed Mobility Overal bed mobility: Needs Assistance Bed Mobility: Supine to Sit     Supine to sit: HOB elevated;Total assist     General bed mobility comments: it took about 20 minutes to slowly progress pt from supine to sit using the HOB maximally elevated...she was willing to slightly move the left LE to edge of bed but needed full assist with right LE  Transfers                 General transfer comment: pt is unable to transfer out of bed without total assist  Ambulation/Gait                  Stairs            Wheelchair Mobility    Modified Rankin (Stroke Patients Only)       Balance Overall balance assessment: Needs assistance Sitting-balance support: Bilateral upper extremity supported;Feet supported Sitting balance-Leahy Scale: Poor Sitting balance - Comments: pt will not sit on the right hip due to discomfort and therefore leans fully to the right, needing support to maintain sitting Postural control: Right lateral lean;Posterior lean                          Cognition Arousal/Alertness: Awake/alert Behavior During Therapy: WFL for tasks assessed/performed Overall Cognitive Status: History of cognitive impairments - at baseline       Memory: Decreased short-term memory;Decreased recall of precautions              Exercises General Exercises - Lower Extremity Ankle Circles/Pumps: AAROM;Both;5 reps;Supine Heel Slides: AAROM;PROM;Both;5 reps;Supine Hip ABduction/ADduction: PROM;AAROM;Both;5 reps;Supine    General Comments        Pertinent Vitals/Pain Pain Assessment: No/denies pain (at rest) Faces Pain Scale: Hurts even more Pain Location: Right leg Pain Intervention(s): Limited activity within patient's tolerance;Monitored during session    Home Living  Prior Function            PT Goals (current goals can now be found in the care plan section) Acute Rehab PT Goals Patient Stated Goal: Son: for pt to be able to return to prior functioning at memory care unit Progress towards PT goals: Progressing toward goals    Frequency       PT Plan Discharge plan needs to be updated    Co-evaluation             End of Session Equipment Utilized During Treatment: Oxygen Activity Tolerance: Patient limited by pain Patient left: in bed;with call bell/phone within reach;with nursing/sitter in room;with bed alarm set     Time: [id number] PT Time Calculation (min) (ACUTE  ONLY): 47 min  Charges:  $Therapeutic Exercise: 8-22 mins $Therapeutic Activity: 23-37 mins                    G Codes:      Konrad PentaBrown, Juma Oxley L 02/26/2014, 12:15 PM

## 2014-02-27 ENCOUNTER — Non-Acute Institutional Stay (SKILLED_NURSING_FACILITY): Payer: Commercial Managed Care - HMO | Admitting: Internal Medicine

## 2014-02-27 ENCOUNTER — Encounter (HOSPITAL_COMMUNITY): Payer: Self-pay | Admitting: Orthopedic Surgery

## 2014-02-27 DIAGNOSIS — S72141D Displaced intertrochanteric fracture of right femur, subsequent encounter for closed fracture with routine healing: Secondary | ICD-10-CM

## 2014-02-27 DIAGNOSIS — G309 Alzheimer's disease, unspecified: Secondary | ICD-10-CM | POA: Diagnosis not present

## 2014-02-27 DIAGNOSIS — N183 Chronic kidney disease, stage 3 unspecified: Secondary | ICD-10-CM

## 2014-02-27 DIAGNOSIS — F0280 Dementia in other diseases classified elsewhere without behavioral disturbance: Secondary | ICD-10-CM

## 2014-02-27 NOTE — Addendum Note (Signed)
Addendum  created 02/27/14 0900 by Earleen NewportAmy A Adams, CRNA   Modules edited: Anesthesia Events, Narrator Events   Narrator Events:  Delete Anesthesia Start event

## 2014-03-03 NOTE — Progress Notes (Addendum)
Patient ID: Laura Savage, female   DOB: 06/13/1929, 81 y.o.   MRN: [id number]               HISTORY & PHYSICAL  DATE:  02/27/2014               [facility]       LEVEL OF CARE:   SNF   CHIEF COMPLAINT:  Admission to SNF, post stay at [facility], 02/23/2014 through 02/26/2014.     HISTORY OF PRESENT ILLNESS:  This is a patient who lives in [facility].  She apparently suffered a fall.  She underwent an ORIF for a right hip fracture on 02/23/2014.    Noted to have thrombocytopenia and postoperative anemia, as well as advanced dementia.    She also has chronic renal failure, listed as stage III.    LABS/RADIOLOGY:    Review of lab work shows her discharge lab work to show a white count of 8.9, a hemoglobin of 9.7, platelet count of 113,000.  It would appear the platelets got as low as 93,000 during the hospitalization.  Her hemoglobin was 12.1 on admission and discharged at 9.7.    Basic metabolic panel at discharge showed a sodium of 138, potassium of 4.1, BUN of 18, and creatinine of 1.32.  Estimated GFR was 36.    Hemoglobin A1c was 5.7.    A CT scan of the head was negative.    CT scan of the cervical spine showed some compression of C6, 3 mm anterolisthesis of C6 on C7.    PAST MEDICAL HISTORY/PROBLEM LIST:                    Gastroesophageal reflux disease.          Dysphagia.    Stage III chronic renal failure.     Hypertension.    Thrombocytopenia in hospital.    Mild hyperglycemia with a normal hemoglobin A1c.    Moderate obesity.    Chronic dementia, which is probably severe (brief review of her chart shows fecal incontinence, etc.).    CURRENT MEDICATIONS:  Discharge medications include:      Tylenol 650 routinely every 8 hours for three days.    Dulcolax 5 mg orally p.r.n.      Colace 100 b.i.d.    Hydrocodone/acetaminophen 5/325, 1 p.o. q.6 hours p.r.n.       Robaxin 500 q.12 for three further days.    MiraLAX 17 g  daily.    Xanax 1 mg, 1/2 tablet/0.5 mg at noon and 1 tablet q.h.s.      ASA 325 a day for 30 days for DVT prophylaxis, then resume 81 q.d.    Aricept 5 q.h.s.    Guaifenesin 400 q.8 p.r.n. for cough.    SOCIAL HISTORY:                       ADVANCED DIRECTIVES:  The patient is a DNR, signed on the chart.   HOUSING:  [facility].   FUNCTIONAL STATUS:  Exact functional status is unclear.     REVIEW OF SYSTEMS:    Not possible in this patient due to dementia.  However, staff report that she basically is combative with any attempts to move her or with personal care.    PHYSICAL EXAMINATION:          VITAL SIGNS:   O2 SATURATIONS:  95%  on room air.   RESPIRATIONS:  20.   PULSE:  87 and regular.   GENERAL APPEARANCE:  The patient is not in any distress.   Indeed, before you actually move this lady, she is actually quite cooperative and pleasant.   HEENT:   MOUTH/THROAT:   Mucous membranes are moist.   CHEST/RESPIRATORY:  Exam is clear.      CARDIOVASCULAR:  CARDIAC:   Heart sounds are normal.  She appears to be euvolemic.   GASTROINTESTINAL:  ABDOMEN:   Distended.  There are no masses.  No overt tenderness.  Bowel sounds are reduced.   LIVER/SPLEEN/KIDNEYS:  No liver, no spleen.   GENITOURINARY:  BLADDER:    Not distended.  There is no CVA tenderness.    MUSCULOSKELETAL:   EXTREMITIES:   LEFT LOWER EXTREMITY:  She has had a left total knee replacement.   RIGHT LOWER EXTREMITY:  Her surgical incision in the right hip area is clean.  There is no evidence of infection.   Some sanguineous drainage is noted.   CIRCULATION:  EDEMA/VARICOSITIES:  Extremities:  No edema and no evidence of a DVT.   NEUROLOGICAL:   What I am able to get is non-lateralizing.   DEEP TENDON REFLEXES:  Her reflexes are symmetric.   SENSATION/STRENGTH:  She has antigravity strength.     ASSESSMENT/PLAN:                            Right hip fracture.  Status post ORIF.  She has  completed surgery.    Severe dementia.  The patient is orientated to name only.    Post surgical pain.  I suspect I am going to need to schedule her hydrocodone.    Chronic renal insufficiency, which is stage III.    On what appears to be chronic Xanax.  Never a good drug for people who fall.  However, for now, I will maintain this.    ?Constipation and/or current ileus.  We will need to probably check her for a distal impaction.    I see no cardiopulmonary issues.  I will follow up her lab work next week.  Check for distal impaction.     The patient's severe dementia will likely make rehabilitation challenging.  I do not know what her premorbid functional level was.

## 2014-03-05 DIAGNOSIS — Z79899 Other long term (current) drug therapy: Secondary | ICD-10-CM | POA: Diagnosis not present

## 2014-03-05 DIAGNOSIS — D649 Anemia, unspecified: Secondary | ICD-10-CM | POA: Diagnosis not present

## 2014-03-06 ENCOUNTER — Non-Acute Institutional Stay (SKILLED_NURSING_FACILITY): Payer: Commercial Managed Care - HMO | Admitting: Internal Medicine

## 2014-03-06 DIAGNOSIS — G309 Alzheimer's disease, unspecified: Secondary | ICD-10-CM | POA: Diagnosis not present

## 2014-03-06 DIAGNOSIS — S72141D Displaced intertrochanteric fracture of right femur, subsequent encounter for closed fracture with routine healing: Secondary | ICD-10-CM

## 2014-03-06 DIAGNOSIS — F0280 Dementia in other diseases classified elsewhere without behavioral disturbance: Secondary | ICD-10-CM

## 2014-03-06 DIAGNOSIS — N183 Chronic kidney disease, stage 3 unspecified: Secondary | ICD-10-CM

## 2014-03-11 DIAGNOSIS — M25559 Pain in unspecified hip: Secondary | ICD-10-CM | POA: Diagnosis not present

## 2014-03-11 DIAGNOSIS — M25552 Pain in left hip: Secondary | ICD-10-CM | POA: Diagnosis not present

## 2014-03-11 NOTE — Progress Notes (Signed)
Patient ID: Laura Savage, female   DOB: Feb 01, 1930, 81 y.o.   MRN: [id number]               PROGRESS NOTE  DATE:  03/06/2014                 [facility]                    LEVEL OF CARE:   SNF   Acute Visit   HISTORY OF PRESENT ILLNESS:  Laura Savage is a patient whom I admitted to the facility last week.  She suffered a right hip fracture.  She underwent an ORIF on 02/23/2014.     She has chronic renal failure stage III, thrombocytopenia and postoperative anemia, as well as advanced dementia.    Last week when I saw her, turning her over in bed seemed to be painful.  I scheduled her Norco.  I am seeing her today in follow-up.  Some concerns have been expressed by the family with regards to pain.    LABORATORY DATA:  Lab work from 03/05/2014 showed a hemoglobin of 11.  This is up from 9.7 in the hospital.  Platelet count was 369, white count 8.    Her BUN is 26, creatinine of 1.61 which is a little bit higher than what she ran in the hospital, ranging from 1.31 to 1.33.     PHYSICAL EXAMINATION:   GENERAL APPEARANCE:  Actually, the patient looks considerably better than last week.  [place].  She was able to roll over in bed without really complaining of a lot of pain.   CHEST/RESPIRATORY:  Clear air entry bilaterally.   CARDIOVASCULAR:  CARDIAC:  Heart sounds are normal.  She appears to be euvolemic.   GASTROINTESTINAL:  ABDOMEN:   No distended.   LIVER/SPLEEN/KIDNEYS:  No liver, no spleen.   GENITOURINARY:  BLADDER:   Not distended.  No CVA tenderness.   SKIN:  INSPECTION:  Extremities:  Her right hip incision looks clean.  There is no evidence of infection here.   CIRCULATION:   EDEMA/VARICOSITIES:  Extremities:  No evidence of a DVT.    ASSESSMENT/PLAN:    Right hip fracture.  She is status post ORIF.  I put her on routine Norco last week and I actually think that this probably is doing satisfactorily.  Her son who is present expresses a  lot of concern about pain either surrounding or post therapy.  I will see if I can schedule one of these prior to her therapy.    Postoperative anemia.  This seems improved.    Chronic renal insufficiency.  This seems slightly worse.  We will recheck this next week.  She does not appear to be dehydrated.  I do not see any medications that could overtly be affecting her renal function.

## 2014-03-12 DIAGNOSIS — Z79899 Other long term (current) drug therapy: Secondary | ICD-10-CM | POA: Diagnosis not present

## 2014-03-18 DIAGNOSIS — M8588 Other specified disorders of bone density and structure, other site: Secondary | ICD-10-CM | POA: Diagnosis not present

## 2014-03-18 DIAGNOSIS — R278 Other lack of coordination: Secondary | ICD-10-CM | POA: Diagnosis not present

## 2014-03-18 DIAGNOSIS — S72001D Fracture of unspecified part of neck of right femur, subsequent encounter for closed fracture with routine healing: Secondary | ICD-10-CM | POA: Diagnosis not present

## 2014-03-18 DIAGNOSIS — R739 Hyperglycemia, unspecified: Secondary | ICD-10-CM | POA: Diagnosis not present

## 2014-03-18 DIAGNOSIS — N390 Urinary tract infection, site not specified: Secondary | ICD-10-CM | POA: Diagnosis not present

## 2014-03-18 DIAGNOSIS — F0390 Unspecified dementia without behavioral disturbance: Secondary | ICD-10-CM | POA: Diagnosis not present

## 2014-03-18 DIAGNOSIS — S72141D Displaced intertrochanteric fracture of right femur, subsequent encounter for closed fracture with routine healing: Secondary | ICD-10-CM | POA: Diagnosis not present

## 2014-03-18 DIAGNOSIS — R1319 Other dysphagia: Secondary | ICD-10-CM | POA: Diagnosis not present

## 2014-03-18 DIAGNOSIS — G309 Alzheimer's disease, unspecified: Secondary | ICD-10-CM | POA: Diagnosis not present

## 2014-03-18 DIAGNOSIS — Z471 Aftercare following joint replacement surgery: Secondary | ICD-10-CM | POA: Diagnosis not present

## 2014-03-18 DIAGNOSIS — M6281 Muscle weakness (generalized): Secondary | ICD-10-CM | POA: Diagnosis not present

## 2014-03-18 DIAGNOSIS — R262 Difficulty in walking, not elsewhere classified: Secondary | ICD-10-CM | POA: Diagnosis not present

## 2014-03-18 DIAGNOSIS — R41841 Cognitive communication deficit: Secondary | ICD-10-CM | POA: Diagnosis not present

## 2014-03-18 DIAGNOSIS — Z96641 Presence of right artificial hip joint: Secondary | ICD-10-CM | POA: Diagnosis not present

## 2014-03-18 DIAGNOSIS — X58XXXD Exposure to other specified factors, subsequent encounter: Secondary | ICD-10-CM | POA: Diagnosis not present

## 2014-03-25 ENCOUNTER — Other Ambulatory Visit: Payer: Self-pay | Admitting: Orthopedic Surgery

## 2014-03-25 ENCOUNTER — Ambulatory Visit (HOSPITAL_COMMUNITY)
Admission: RE | Admit: 2014-03-25 | Discharge: 2014-03-25 | Disposition: A | Discharge status: Home / Self Care | Payer: Commercial Managed Care - HMO | Source: Ambulatory Visit | Attending: Orthopedic Surgery | Admitting: Orthopedic Surgery

## 2014-03-25 DIAGNOSIS — S72001D Fracture of unspecified part of neck of right femur, subsequent encounter for closed fracture with routine healing: Secondary | ICD-10-CM | POA: Diagnosis not present

## 2014-03-25 DIAGNOSIS — M8588 Other specified disorders of bone density and structure, other site: Secondary | ICD-10-CM | POA: Diagnosis not present

## 2014-03-25 DIAGNOSIS — S72001A Fracture of unspecified part of neck of right femur, initial encounter for closed fracture: Secondary | ICD-10-CM

## 2014-03-25 DIAGNOSIS — Z96641 Presence of right artificial hip joint: Secondary | ICD-10-CM | POA: Diagnosis not present

## 2014-03-25 DIAGNOSIS — Z471 Aftercare following joint replacement surgery: Secondary | ICD-10-CM | POA: Diagnosis not present

## 2014-03-25 DIAGNOSIS — X58XXXD Exposure to other specified factors, subsequent encounter: Secondary | ICD-10-CM | POA: Insufficient documentation

## 2014-03-26 ENCOUNTER — Ambulatory Visit (INDEPENDENT_AMBULATORY_CARE_PROVIDER_SITE_OTHER): Payer: Self-pay | Admitting: Orthopedic Surgery

## 2014-03-26 ENCOUNTER — Encounter: Payer: Self-pay | Admitting: Orthopedic Surgery

## 2014-03-26 VITALS — BP 117/66 | Ht 67.0 in | Wt 188.0 lb

## 2014-03-26 DIAGNOSIS — S72141D Displaced intertrochanteric fracture of right femur, subsequent encounter for closed fracture with routine healing: Secondary | ICD-10-CM

## 2014-03-26 NOTE — Progress Notes (Signed)
Chief Complaint  Patient presents with  . Follow-up    hospital follow up, Rt hip ORIF 02/23/14   Encounter Diagnosis  Name Primary?  . Intertrochanteric fracture of right hip, closed, with routine healing, subsequent encounter Yes    BP 117/66 mmHg  Ht 5\' 7"  (1.702 m)  Wt 188 lb (85.276 kg)  BMI 29.44 kg/m2   One-month follow-up status post dynamic hip screw right hip. Fracture looks good on x-ray hardware is intact. Her leg lengths are equal rotatory alignment is normal  Exam bleeding at the nursing facility, she is returning to [facility] nursing care tomorrow  Follow-up here 8 weeks x-ray [facility] and lateral right hip

## 2014-03-27 DIAGNOSIS — G309 Alzheimer's disease, unspecified: Secondary | ICD-10-CM | POA: Diagnosis not present

## 2014-03-27 NOTE — Addendum Note (Signed)
Addended by: Adella HareBOOTHE, Yuliya Nova B on: 03/27/2014 03:17 PM   Modules accepted: Medications

## 2014-03-28 DIAGNOSIS — G309 Alzheimer's disease, unspecified: Secondary | ICD-10-CM | POA: Diagnosis not present

## 2014-03-29 DIAGNOSIS — F329 Major depressive disorder, single episode, unspecified: Secondary | ICD-10-CM | POA: Diagnosis not present

## 2014-03-29 DIAGNOSIS — G309 Alzheimer's disease, unspecified: Secondary | ICD-10-CM | POA: Diagnosis not present

## 2014-03-29 DIAGNOSIS — S72141D Displaced intertrochanteric fracture of right femur, subsequent encounter for closed fracture with routine healing: Secondary | ICD-10-CM | POA: Diagnosis not present

## 2014-03-29 DIAGNOSIS — F419 Anxiety disorder, unspecified: Secondary | ICD-10-CM | POA: Diagnosis not present

## 2014-03-29 DIAGNOSIS — M1990 Unspecified osteoarthritis, unspecified site: Secondary | ICD-10-CM | POA: Diagnosis not present

## 2014-03-29 DIAGNOSIS — F0280 Dementia in other diseases classified elsewhere without behavioral disturbance: Secondary | ICD-10-CM | POA: Diagnosis not present

## 2014-03-29 DIAGNOSIS — I129 Hypertensive chronic kidney disease with stage 1 through stage 4 chronic kidney disease, or unspecified chronic kidney disease: Secondary | ICD-10-CM | POA: Diagnosis not present

## 2014-03-29 DIAGNOSIS — N183 Chronic kidney disease, stage 3 (moderate): Secondary | ICD-10-CM | POA: Diagnosis not present

## 2014-03-30 DIAGNOSIS — G309 Alzheimer's disease, unspecified: Secondary | ICD-10-CM | POA: Diagnosis not present

## 2014-03-31 DIAGNOSIS — G309 Alzheimer's disease, unspecified: Secondary | ICD-10-CM | POA: Diagnosis not present

## 2014-04-01 DIAGNOSIS — G309 Alzheimer's disease, unspecified: Secondary | ICD-10-CM | POA: Diagnosis not present

## 2014-04-02 DIAGNOSIS — M1990 Unspecified osteoarthritis, unspecified site: Secondary | ICD-10-CM | POA: Diagnosis not present

## 2014-04-02 DIAGNOSIS — F0280 Dementia in other diseases classified elsewhere without behavioral disturbance: Secondary | ICD-10-CM | POA: Diagnosis not present

## 2014-04-02 DIAGNOSIS — I129 Hypertensive chronic kidney disease with stage 1 through stage 4 chronic kidney disease, or unspecified chronic kidney disease: Secondary | ICD-10-CM | POA: Diagnosis not present

## 2014-04-02 DIAGNOSIS — S72141D Displaced intertrochanteric fracture of right femur, subsequent encounter for closed fracture with routine healing: Secondary | ICD-10-CM | POA: Diagnosis not present

## 2014-04-02 DIAGNOSIS — G309 Alzheimer's disease, unspecified: Secondary | ICD-10-CM | POA: Diagnosis not present

## 2014-04-02 DIAGNOSIS — F329 Major depressive disorder, single episode, unspecified: Secondary | ICD-10-CM | POA: Diagnosis not present

## 2014-04-02 DIAGNOSIS — N183 Chronic kidney disease, stage 3 (moderate): Secondary | ICD-10-CM | POA: Diagnosis not present

## 2014-04-02 DIAGNOSIS — F419 Anxiety disorder, unspecified: Secondary | ICD-10-CM | POA: Diagnosis not present

## 2014-04-03 ENCOUNTER — Telehealth: Payer: Self-pay | Admitting: Family Medicine

## 2014-04-03 DIAGNOSIS — F0280 Dementia in other diseases classified elsewhere without behavioral disturbance: Secondary | ICD-10-CM | POA: Diagnosis not present

## 2014-04-03 DIAGNOSIS — F329 Major depressive disorder, single episode, unspecified: Secondary | ICD-10-CM | POA: Diagnosis not present

## 2014-04-03 DIAGNOSIS — M1990 Unspecified osteoarthritis, unspecified site: Secondary | ICD-10-CM | POA: Diagnosis not present

## 2014-04-03 DIAGNOSIS — F419 Anxiety disorder, unspecified: Secondary | ICD-10-CM | POA: Diagnosis not present

## 2014-04-03 DIAGNOSIS — G309 Alzheimer's disease, unspecified: Secondary | ICD-10-CM | POA: Diagnosis not present

## 2014-04-03 DIAGNOSIS — S72141D Displaced intertrochanteric fracture of right femur, subsequent encounter for closed fracture with routine healing: Secondary | ICD-10-CM | POA: Diagnosis not present

## 2014-04-03 DIAGNOSIS — I129 Hypertensive chronic kidney disease with stage 1 through stage 4 chronic kidney disease, or unspecified chronic kidney disease: Secondary | ICD-10-CM | POA: Diagnosis not present

## 2014-04-03 DIAGNOSIS — N183 Chronic kidney disease, stage 3 (moderate): Secondary | ICD-10-CM | POA: Diagnosis not present

## 2014-04-03 MED ORDER — OXYCODONE HCL 5 MG PO TABS: ORAL_TABLET | ORAL | Status: DC

## 2014-04-03 NOTE — Telephone Encounter (Signed)
Pt's oxycodone Rx has two different sigs.  Can you reprint with either Q6 or Q8 hours? I don't see that you wrote Rx for oxycodone but I spoke with Lequita HaltMorgan at home and she said they have an Rx signed by you from 2/15 for oxycodone 5mg  Q6h.  Please advise.

## 2014-04-03 NOTE — Telephone Encounter (Signed)
Printed oxycodone 5mg , 1 tab q6h prn, #60.

## 2014-04-03 NOTE — Telephone Encounter (Signed)
I returned a call to [facility] today and the pt care coordinator said that patient is not in pain and she asks if all pain meds can be discontinued.  I said yes and wrote order to d/c all narcotic pain med for pt.

## 2014-04-03 NOTE — Telephone Encounter (Signed)
Not sure waht this wa s

## 2014-04-03 NOTE — Telephone Encounter (Signed)
Rx faxed to nursing home.

## 2014-04-04 DIAGNOSIS — F0280 Dementia in other diseases classified elsewhere without behavioral disturbance: Secondary | ICD-10-CM | POA: Diagnosis not present

## 2014-04-04 DIAGNOSIS — I129 Hypertensive chronic kidney disease with stage 1 through stage 4 chronic kidney disease, or unspecified chronic kidney disease: Secondary | ICD-10-CM | POA: Diagnosis not present

## 2014-04-04 DIAGNOSIS — N183 Chronic kidney disease, stage 3 (moderate): Secondary | ICD-10-CM | POA: Diagnosis not present

## 2014-04-04 DIAGNOSIS — S72141D Displaced intertrochanteric fracture of right femur, subsequent encounter for closed fracture with routine healing: Secondary | ICD-10-CM | POA: Diagnosis not present

## 2014-04-04 DIAGNOSIS — G309 Alzheimer's disease, unspecified: Secondary | ICD-10-CM | POA: Diagnosis not present

## 2014-04-04 DIAGNOSIS — F419 Anxiety disorder, unspecified: Secondary | ICD-10-CM | POA: Diagnosis not present

## 2014-04-04 DIAGNOSIS — F329 Major depressive disorder, single episode, unspecified: Secondary | ICD-10-CM | POA: Diagnosis not present

## 2014-04-04 DIAGNOSIS — M1990 Unspecified osteoarthritis, unspecified site: Secondary | ICD-10-CM | POA: Diagnosis not present

## 2014-04-05 DIAGNOSIS — I129 Hypertensive chronic kidney disease with stage 1 through stage 4 chronic kidney disease, or unspecified chronic kidney disease: Secondary | ICD-10-CM | POA: Diagnosis not present

## 2014-04-05 DIAGNOSIS — N183 Chronic kidney disease, stage 3 (moderate): Secondary | ICD-10-CM | POA: Diagnosis not present

## 2014-04-05 DIAGNOSIS — M1990 Unspecified osteoarthritis, unspecified site: Secondary | ICD-10-CM | POA: Diagnosis not present

## 2014-04-05 DIAGNOSIS — F329 Major depressive disorder, single episode, unspecified: Secondary | ICD-10-CM | POA: Diagnosis not present

## 2014-04-05 DIAGNOSIS — F419 Anxiety disorder, unspecified: Secondary | ICD-10-CM | POA: Diagnosis not present

## 2014-04-05 DIAGNOSIS — F0280 Dementia in other diseases classified elsewhere without behavioral disturbance: Secondary | ICD-10-CM | POA: Diagnosis not present

## 2014-04-05 DIAGNOSIS — S72141D Displaced intertrochanteric fracture of right femur, subsequent encounter for closed fracture with routine healing: Secondary | ICD-10-CM | POA: Diagnosis not present

## 2014-04-05 DIAGNOSIS — G309 Alzheimer's disease, unspecified: Secondary | ICD-10-CM | POA: Diagnosis not present

## 2014-04-06 DIAGNOSIS — G309 Alzheimer's disease, unspecified: Secondary | ICD-10-CM | POA: Diagnosis not present

## 2014-04-07 DIAGNOSIS — G309 Alzheimer's disease, unspecified: Secondary | ICD-10-CM | POA: Diagnosis not present

## 2014-04-08 DIAGNOSIS — N183 Chronic kidney disease, stage 3 (moderate): Secondary | ICD-10-CM | POA: Diagnosis not present

## 2014-04-08 DIAGNOSIS — F419 Anxiety disorder, unspecified: Secondary | ICD-10-CM | POA: Diagnosis not present

## 2014-04-08 DIAGNOSIS — F0280 Dementia in other diseases classified elsewhere without behavioral disturbance: Secondary | ICD-10-CM | POA: Diagnosis not present

## 2014-04-08 DIAGNOSIS — S72141D Displaced intertrochanteric fracture of right femur, subsequent encounter for closed fracture with routine healing: Secondary | ICD-10-CM | POA: Diagnosis not present

## 2014-04-08 DIAGNOSIS — M1990 Unspecified osteoarthritis, unspecified site: Secondary | ICD-10-CM | POA: Diagnosis not present

## 2014-04-08 DIAGNOSIS — F329 Major depressive disorder, single episode, unspecified: Secondary | ICD-10-CM | POA: Diagnosis not present

## 2014-04-08 DIAGNOSIS — I129 Hypertensive chronic kidney disease with stage 1 through stage 4 chronic kidney disease, or unspecified chronic kidney disease: Secondary | ICD-10-CM | POA: Diagnosis not present

## 2014-04-08 DIAGNOSIS — G309 Alzheimer's disease, unspecified: Secondary | ICD-10-CM | POA: Diagnosis not present

## 2014-04-09 DIAGNOSIS — G309 Alzheimer's disease, unspecified: Secondary | ICD-10-CM | POA: Diagnosis not present

## 2014-04-10 DIAGNOSIS — I129 Hypertensive chronic kidney disease with stage 1 through stage 4 chronic kidney disease, or unspecified chronic kidney disease: Secondary | ICD-10-CM | POA: Diagnosis not present

## 2014-04-10 DIAGNOSIS — F329 Major depressive disorder, single episode, unspecified: Secondary | ICD-10-CM | POA: Diagnosis not present

## 2014-04-10 DIAGNOSIS — F419 Anxiety disorder, unspecified: Secondary | ICD-10-CM | POA: Diagnosis not present

## 2014-04-10 DIAGNOSIS — N183 Chronic kidney disease, stage 3 (moderate): Secondary | ICD-10-CM | POA: Diagnosis not present

## 2014-04-10 DIAGNOSIS — F0280 Dementia in other diseases classified elsewhere without behavioral disturbance: Secondary | ICD-10-CM | POA: Diagnosis not present

## 2014-04-10 DIAGNOSIS — M1990 Unspecified osteoarthritis, unspecified site: Secondary | ICD-10-CM | POA: Diagnosis not present

## 2014-04-10 DIAGNOSIS — S72141D Displaced intertrochanteric fracture of right femur, subsequent encounter for closed fracture with routine healing: Secondary | ICD-10-CM | POA: Diagnosis not present

## 2014-04-10 DIAGNOSIS — G309 Alzheimer's disease, unspecified: Secondary | ICD-10-CM | POA: Diagnosis not present

## 2014-04-11 DIAGNOSIS — G309 Alzheimer's disease, unspecified: Secondary | ICD-10-CM | POA: Diagnosis not present

## 2014-04-12 DIAGNOSIS — G309 Alzheimer's disease, unspecified: Secondary | ICD-10-CM | POA: Diagnosis not present

## 2014-04-12 DIAGNOSIS — F419 Anxiety disorder, unspecified: Secondary | ICD-10-CM | POA: Diagnosis not present

## 2014-04-12 DIAGNOSIS — F0280 Dementia in other diseases classified elsewhere without behavioral disturbance: Secondary | ICD-10-CM | POA: Diagnosis not present

## 2014-04-12 DIAGNOSIS — M1990 Unspecified osteoarthritis, unspecified site: Secondary | ICD-10-CM | POA: Diagnosis not present

## 2014-04-12 DIAGNOSIS — N183 Chronic kidney disease, stage 3 (moderate): Secondary | ICD-10-CM | POA: Diagnosis not present

## 2014-04-12 DIAGNOSIS — I129 Hypertensive chronic kidney disease with stage 1 through stage 4 chronic kidney disease, or unspecified chronic kidney disease: Secondary | ICD-10-CM | POA: Diagnosis not present

## 2014-04-12 DIAGNOSIS — F329 Major depressive disorder, single episode, unspecified: Secondary | ICD-10-CM | POA: Diagnosis not present

## 2014-04-12 DIAGNOSIS — S72141D Displaced intertrochanteric fracture of right femur, subsequent encounter for closed fracture with routine healing: Secondary | ICD-10-CM | POA: Diagnosis not present

## 2014-04-13 DIAGNOSIS — G309 Alzheimer's disease, unspecified: Secondary | ICD-10-CM | POA: Diagnosis not present

## 2014-04-14 DIAGNOSIS — G309 Alzheimer's disease, unspecified: Secondary | ICD-10-CM | POA: Diagnosis not present

## 2014-04-15 DIAGNOSIS — F0280 Dementia in other diseases classified elsewhere without behavioral disturbance: Secondary | ICD-10-CM | POA: Diagnosis not present

## 2014-04-15 DIAGNOSIS — F419 Anxiety disorder, unspecified: Secondary | ICD-10-CM | POA: Diagnosis not present

## 2014-04-15 DIAGNOSIS — S72141D Displaced intertrochanteric fracture of right femur, subsequent encounter for closed fracture with routine healing: Secondary | ICD-10-CM | POA: Diagnosis not present

## 2014-04-15 DIAGNOSIS — F329 Major depressive disorder, single episode, unspecified: Secondary | ICD-10-CM | POA: Diagnosis not present

## 2014-04-15 DIAGNOSIS — N183 Chronic kidney disease, stage 3 (moderate): Secondary | ICD-10-CM | POA: Diagnosis not present

## 2014-04-15 DIAGNOSIS — I129 Hypertensive chronic kidney disease with stage 1 through stage 4 chronic kidney disease, or unspecified chronic kidney disease: Secondary | ICD-10-CM | POA: Diagnosis not present

## 2014-04-15 DIAGNOSIS — M1990 Unspecified osteoarthritis, unspecified site: Secondary | ICD-10-CM | POA: Diagnosis not present

## 2014-04-15 DIAGNOSIS — G309 Alzheimer's disease, unspecified: Secondary | ICD-10-CM | POA: Diagnosis not present

## 2014-04-16 DIAGNOSIS — G309 Alzheimer's disease, unspecified: Secondary | ICD-10-CM | POA: Diagnosis not present

## 2014-04-17 ENCOUNTER — Emergency Department (HOSPITAL_COMMUNITY): Payer: Commercial Managed Care - HMO

## 2014-04-17 ENCOUNTER — Emergency Department (HOSPITAL_COMMUNITY)
Admission: EM | Admit: 2014-04-17 | Discharge: 2014-04-17 | Disposition: A | Discharge status: Other Acute Inpatient Hospital | Payer: Commercial Managed Care - HMO | Attending: Emergency Medicine | Admitting: Emergency Medicine

## 2014-04-17 ENCOUNTER — Encounter (HOSPITAL_COMMUNITY): Payer: Self-pay | Admitting: Emergency Medicine

## 2014-04-17 DIAGNOSIS — N183 Chronic kidney disease, stage 3 (moderate): Secondary | ICD-10-CM | POA: Insufficient documentation

## 2014-04-17 DIAGNOSIS — I129 Hypertensive chronic kidney disease with stage 1 through stage 4 chronic kidney disease, or unspecified chronic kidney disease: Secondary | ICD-10-CM | POA: Diagnosis not present

## 2014-04-17 DIAGNOSIS — I10 Essential (primary) hypertension: Secondary | ICD-10-CM | POA: Diagnosis not present

## 2014-04-17 DIAGNOSIS — W1839XA Other fall on same level, initial encounter: Secondary | ICD-10-CM | POA: Insufficient documentation

## 2014-04-17 DIAGNOSIS — F0390 Unspecified dementia without behavioral disturbance: Secondary | ICD-10-CM | POA: Insufficient documentation

## 2014-04-17 DIAGNOSIS — G309 Alzheimer's disease, unspecified: Secondary | ICD-10-CM | POA: Diagnosis not present

## 2014-04-17 DIAGNOSIS — Z8781 Personal history of (healed) traumatic fracture: Secondary | ICD-10-CM | POA: Insufficient documentation

## 2014-04-17 DIAGNOSIS — S79921A Unspecified injury of right thigh, initial encounter: Secondary | ICD-10-CM | POA: Insufficient documentation

## 2014-04-17 DIAGNOSIS — M79651 Pain in right thigh: Secondary | ICD-10-CM | POA: Diagnosis not present

## 2014-04-17 DIAGNOSIS — Y9389 Activity, other specified: Secondary | ICD-10-CM | POA: Insufficient documentation

## 2014-04-17 DIAGNOSIS — Z8669 Personal history of other diseases of the nervous system and sense organs: Secondary | ICD-10-CM | POA: Diagnosis not present

## 2014-04-17 DIAGNOSIS — Z7982 Long term (current) use of aspirin: Secondary | ICD-10-CM | POA: Insufficient documentation

## 2014-04-17 DIAGNOSIS — Y92012 Bathroom of single-family (private) house as the place of occurrence of the external cause: Secondary | ICD-10-CM | POA: Diagnosis not present

## 2014-04-17 DIAGNOSIS — F329 Major depressive disorder, single episode, unspecified: Secondary | ICD-10-CM | POA: Insufficient documentation

## 2014-04-17 DIAGNOSIS — Y998 Other external cause status: Secondary | ICD-10-CM | POA: Insufficient documentation

## 2014-04-17 DIAGNOSIS — Z9889 Other specified postprocedural states: Secondary | ICD-10-CM | POA: Diagnosis not present

## 2014-04-17 DIAGNOSIS — Z79899 Other long term (current) drug therapy: Secondary | ICD-10-CM | POA: Insufficient documentation

## 2014-04-17 DIAGNOSIS — R52 Pain, unspecified: Secondary | ICD-10-CM | POA: Diagnosis not present

## 2014-04-17 DIAGNOSIS — R404 Transient alteration of awareness: Secondary | ICD-10-CM | POA: Diagnosis not present

## 2014-04-17 LAB — CBC
HCT: 37.7 % (ref 36.0–46.0)
Hemoglobin: 12.2 g/dL (ref 12.0–15.0)
MCH: 30.7 pg (ref 26.0–34.0)
MCHC: 32.4 g/dL (ref 30.0–36.0)
MCV: 94.7 fL (ref 78.0–100.0)
Platelets: 159 10*3/uL (ref 150–400)
RBC: 3.98 MIL/uL (ref 3.87–5.11)
RDW: 12.8 % (ref 11.5–15.5)
WBC: 6.4 10*3/uL (ref 4.0–10.5)

## 2014-04-17 LAB — BASIC METABOLIC PANEL
Anion gap: 8 (ref 5–15)
BUN: 20 mg/dL (ref 6–23)
CO2: 29 mmol/L (ref 19–32)
CREATININE: 1.36 mg/dL — AB (ref 0.50–1.10)
Calcium: 9.9 mg/dL (ref 8.4–10.5)
Chloride: 106 mmol/L (ref 96–112)
GFR calc Af Amer: 41 mL/min — ABNORMAL LOW (ref >90)
GFR calc non Af Amer: 35 mL/min — ABNORMAL LOW (ref >90)
Glucose, Bld: 109 mg/dL — ABNORMAL HIGH (ref 70–99)
Potassium: 3.6 mmol/L (ref 3.5–5.1)
Sodium: 143 mmol/L (ref 135–145)

## 2014-04-17 NOTE — ED Notes (Signed)
RN spoke with Lupita [facility]Donna at [facility], report given and Lupita LeashDonna informed pt to be d/c'd. Lupita LeashDonna is to return call to inform RN if facility transportation is available to pick up pt.

## 2014-04-17 NOTE — ED Notes (Signed)
EDP at bedside  

## 2014-04-17 NOTE — ED Provider Notes (Signed)
CSN: [id number]     Arrival date & time 04/17/14  [id number] History  This chart was scribed for Donnetta HutchingBrian Evora Schechter, MD by Luisa DagoPriscilla Tutu, ED Scribe. This patient was seen in room APA04/APA04 and the patient's care was started at 7:10 AM.    Chief Complaint  Patient presents with  . Fall   Level 5 Caveat- hx of dementia  The history is provided by the patient, medical records, the EMS personnel and the nursing home. No language interpreter was used.   HPI Comments: Laura Savage is a 81 y.o. female who was brought to the Emergency Department by EMS complaining of an accidental mechanical fall that occurred this AM. [facility]. As per staff pt fell this AM while going to the bathroom, she was found sitting on bottom beside toilet when EMS arrived.  She is complaining of right thigh which she is unsure if its from the fall.  Past Medical History  Diagnosis Date  . Hypertension   . Dementia     CT brain 05/2013: chronic atrophy and small vessel ischemic changes.  . Depression   . Osteoarthritis   . Chronic renal insufficiency, stage III (moderate)     2013 renal u/s: medical renal dz  . Insomnia   . LBBB (left bundle branch block) 07/2006  . Intertrochanteric fracture of right hip 02/22/2014    Dis[facility] to [facility] 02/26/14  . Thrombocytopenia    Past Surgical History  Procedure Laterality Date  . Cardiovascular stress test  07/2006    No significant obstructive dz, normal LV EF but dyssynergic LV wall contraction c/w LBBB.  . Cardiac catheterization  07/2006    Aneurismal dilatation of coronaries, no significant astherosclerotic dz, normal LV function  . Vaginal hysterectomy  08/2002  . Rectocele repair  08/2002    with cystourethrocele repair at the same time  . Compression hip screw Right 02/23/2014    Procedure: OPEN TREATMENT INTERNAL FIXATION RIGHT HIP;  Surgeon: Vickki HearingStanley E Harrison, MD;  Location: [facility];  Service: Orthopedics;  Laterality:  Right;   History reviewed. No pertinent family history. History  Substance Use Topics  . Smoking status: Never Smoker   . Smokeless tobacco: Not on file  . Alcohol Use: No   OB History    No data available     Review of Systems  Unable to perform ROS: Dementia   Allergies  Review of patient's allergies indicates no known allergies.  Home Medications   Prior to Admission medications   Medication Sig Start Date End Date Taking? Authorizing Provider  albuterol (PROVENTIL HFA;VENTOLIN HFA) 108 (90 BASE) MCG/ACT inhaler Inhale 2 puffs into the lungs 2 (two) times daily. 07/31/13  Yes Kelle DartingLayne C Weaver, NP  ALPRAZolam Prudy Feeler(XANAX) 1 MG tablet 1/2 tab po q 12 NOON and 1 tab po qhs 02/26/14  Yes Gwenyth BenderKaren M Black, NP  aspirin EC 325 MG EC tablet Take 1 tablet (325 mg total) by mouth daily with breakfast. For 30 days then resume 81mg  daily 02/26/14  Yes Lesle ChrisKaren M Black, NP  bisacodyl (DULCOLAX) 5 MG EC tablet Take 1 tablet (5 mg total) by mouth daily as needed for moderate constipation. 02/26/14  Yes Lesle ChrisKaren M Black, NP  docusate sodium 100 MG CAPS Take 100 mg by mouth 2 (two) times daily. 02/26/14  Yes Lesle ChrisKaren M Black, NP  donepezil (ARICEPT) 5 MG tablet 1 tab po qhs Patient taking differently: Take 5 mg by mouth  at bedtime. 1 tab po qhs 02/26/14  Yes Lesle Chris Black, NP  fexofenadine (ALLEGRA) 60 MG tablet Take 1 tablet (60 mg total) by mouth 2 (two) times daily. 12/25/13  Yes Jeoffrey Massed, MD  losartan-hydrochlorothiazide (HYZAAR) 50-12.5 MG per tablet Take 1 tablet by mouth daily.   Yes Historical Provider, MD  Multiple Vitamin (MULTIVITAMIN WITH MINERALS) TABS tablet Take 1 tablet by mouth daily.   Yes Historical Provider, MD  omeprazole (PRILOSEC) 40 MG capsule Take 1 capsule (40 mg total) by mouth daily. 08/08/12  Yes Jeoffrey Massed, MD  polyethylene glycol (MIRALAX / GLYCOLAX) packet Take 17 g by mouth daily. 02/26/14  Yes Lesle Chris Black, NP  potassium chloride SA (K-DUR,KLOR-CON) 20 MEQ tablet Take 1  tablet (20 mEq total) by mouth daily. 01/05/13  Yes Jeoffrey Massed, MD  sertraline (ZOLOFT) 100 MG tablet Take 1 tablet (100 mg total) by mouth daily. 10/11/12  Yes Jeoffrey Massed, MD  acetaminophen (TYLENOL) 325 MG tablet Take 2 tablets (650 mg total) by mouth every 8 (eight) hours. For 3 more days. Patient not taking: Reported on 03/27/2014 02/26/14   Gwenyth Bender, NP  guaifenesin (HUMIBID E) 400 MG TABS tablet Take 1 tablet (400 mg total) by mouth every 8 (eight) hours as needed (for cough). Patient not taking: Reported on 04/17/2014 02/26/14   Gwenyth Bender, NP  methocarbamol (ROBAXIN) 500 MG tablet Take 1 tablet (500 mg total) by mouth every 12 (twelve) hours. For 3 more days. Patient not taking: Reported on 04/17/2014 02/26/14   Gwenyth Bender, NP  Probiotic Product (ALIGN) 4 MG CAPS 1cap PO at lunch daily for 1 month. Patient not taking: Reported on 02/13/2014 07/31/13   Kelle Darting, NP   Pulse 70  Temp(Src) 98 F (36.7 C) (Oral)  Resp 18  Ht  (1.727 m)  Wt 185 lb (83.915 kg)  BMI 28.14 kg/m2  SpO2 97%  Physical Exam  Constitutional: She is oriented to person, place, and time. She appears well-developed and well-nourished.  Mild dementia  HENT:  Head: Normocephalic and atraumatic.  Eyes: Conjunctivae and EOM are normal. Pupils are equal, round, and reactive to light.  Neck: Normal range of motion. Neck supple.  Cardiovascular: Normal rate and regular rhythm.   Pulmonary/Chest: Effort normal and breath sounds normal.  Abdominal: Soft. Bowel sounds are normal.  Musculoskeletal: Normal range of motion.  Tenderness to lateral thigh area.  Neurological: She is alert and oriented to person, place, and time.  Skin: Skin is warm and dry.  Psychiatric: She has a normal mood and affect. Her behavior is normal.  Nursing note and vitals reviewed.   ED Course  Procedures (including critical care time)  DIAGNOSTIC STUDIES: Oxygen Saturation is 97% on RA, adequate by my  interpretation.    COORDINATION OF CARE: 7:19 AM-Will order X-ray of right thigh. Pt advised of plan for treatment and pt agrees.  Labs Review Labs Reviewed  BASIC METABOLIC PANEL - Abnormal; Notable for the following:    Glucose, Bld 109 (*)    Creatinine, Ser 1.36 (*)    GFR calc non Af Amer 35 (*)    GFR calc Af Amer 41 (*)    All other components within normal limits  CBC    Imaging Review No results found.   EKG Interpretation   Date/Time:  Wednesday April 17 2014 07:00:53 EST Ventricular Rate:  73 PR Interval:  177 QRS Duration: 140 QT Interval:  447  QTC Calculation: 493 R Axis:   -58 Text Interpretation:  Sinus rhythm Left bundle branch block Confirmed by  Aalaysia Liggins  MD, Mckynna Vanloan ([id number]) on 04/17/2014 7:35:12 AM      MDM   Final diagnoses:  Right thigh pain    Plain films of right femur show no fracture or subluxation. Patient appears to be medically stable. No head or neck trauma.  I personally performed the services described in this documentation, which was scribed in my presence. The recorded information has been reviewed and is accurate.    Donnetta Hutching, MD 04/19/14 [phone]

## 2014-04-17 NOTE — ED Notes (Signed)
[facility]. Per EMS, pt fell this am while going to the bathroom. Pt found sitting on bottom beside toilet when EMS arrived. CBG en route 109. Pt alert and talking in triage. Pt reports dizziness and abdominal pain.

## 2014-04-17 NOTE — ED Notes (Signed)
[facility] reported that facility would coordinate [facility]. Facility staff en route at this time. EDP aware.

## 2014-04-17 NOTE — Discharge Instructions (Signed)
Tests were good including x-ray of right femur. Follow-up with primary care doctor.

## 2014-04-18 DIAGNOSIS — F329 Major depressive disorder, single episode, unspecified: Secondary | ICD-10-CM | POA: Diagnosis not present

## 2014-04-18 DIAGNOSIS — G309 Alzheimer's disease, unspecified: Secondary | ICD-10-CM | POA: Diagnosis not present

## 2014-04-18 DIAGNOSIS — S72141D Displaced intertrochanteric fracture of right femur, subsequent encounter for closed fracture with routine healing: Secondary | ICD-10-CM | POA: Diagnosis not present

## 2014-04-18 DIAGNOSIS — M1990 Unspecified osteoarthritis, unspecified site: Secondary | ICD-10-CM | POA: Diagnosis not present

## 2014-04-18 DIAGNOSIS — N183 Chronic kidney disease, stage 3 (moderate): Secondary | ICD-10-CM | POA: Diagnosis not present

## 2014-04-18 DIAGNOSIS — F419 Anxiety disorder, unspecified: Secondary | ICD-10-CM | POA: Diagnosis not present

## 2014-04-18 DIAGNOSIS — F0280 Dementia in other diseases classified elsewhere without behavioral disturbance: Secondary | ICD-10-CM | POA: Diagnosis not present

## 2014-04-18 DIAGNOSIS — I129 Hypertensive chronic kidney disease with stage 1 through stage 4 chronic kidney disease, or unspecified chronic kidney disease: Secondary | ICD-10-CM | POA: Diagnosis not present

## 2014-04-19 DIAGNOSIS — S72141D Displaced intertrochanteric fracture of right femur, subsequent encounter for closed fracture with routine healing: Secondary | ICD-10-CM | POA: Diagnosis not present

## 2014-04-19 DIAGNOSIS — N183 Chronic kidney disease, stage 3 (moderate): Secondary | ICD-10-CM | POA: Diagnosis not present

## 2014-04-19 DIAGNOSIS — G309 Alzheimer's disease, unspecified: Secondary | ICD-10-CM | POA: Diagnosis not present

## 2014-04-19 DIAGNOSIS — F0280 Dementia in other diseases classified elsewhere without behavioral disturbance: Secondary | ICD-10-CM | POA: Diagnosis not present

## 2014-04-19 DIAGNOSIS — M1990 Unspecified osteoarthritis, unspecified site: Secondary | ICD-10-CM | POA: Diagnosis not present

## 2014-04-19 DIAGNOSIS — F329 Major depressive disorder, single episode, unspecified: Secondary | ICD-10-CM | POA: Diagnosis not present

## 2014-04-19 DIAGNOSIS — F419 Anxiety disorder, unspecified: Secondary | ICD-10-CM | POA: Diagnosis not present

## 2014-04-19 DIAGNOSIS — I129 Hypertensive chronic kidney disease with stage 1 through stage 4 chronic kidney disease, or unspecified chronic kidney disease: Secondary | ICD-10-CM | POA: Diagnosis not present

## 2014-04-20 DIAGNOSIS — G309 Alzheimer's disease, unspecified: Secondary | ICD-10-CM | POA: Diagnosis not present

## 2014-04-21 DIAGNOSIS — G309 Alzheimer's disease, unspecified: Secondary | ICD-10-CM | POA: Diagnosis not present

## 2014-04-22 DIAGNOSIS — S72141D Displaced intertrochanteric fracture of right femur, subsequent encounter for closed fracture with routine healing: Secondary | ICD-10-CM | POA: Diagnosis not present

## 2014-04-22 DIAGNOSIS — F0280 Dementia in other diseases classified elsewhere without behavioral disturbance: Secondary | ICD-10-CM | POA: Diagnosis not present

## 2014-04-22 DIAGNOSIS — G309 Alzheimer's disease, unspecified: Secondary | ICD-10-CM | POA: Diagnosis not present

## 2014-04-22 DIAGNOSIS — F419 Anxiety disorder, unspecified: Secondary | ICD-10-CM | POA: Diagnosis not present

## 2014-04-22 DIAGNOSIS — F329 Major depressive disorder, single episode, unspecified: Secondary | ICD-10-CM | POA: Diagnosis not present

## 2014-04-22 DIAGNOSIS — M1990 Unspecified osteoarthritis, unspecified site: Secondary | ICD-10-CM | POA: Diagnosis not present

## 2014-04-22 DIAGNOSIS — I129 Hypertensive chronic kidney disease with stage 1 through stage 4 chronic kidney disease, or unspecified chronic kidney disease: Secondary | ICD-10-CM | POA: Diagnosis not present

## 2014-04-22 DIAGNOSIS — N183 Chronic kidney disease, stage 3 (moderate): Secondary | ICD-10-CM | POA: Diagnosis not present

## 2014-04-23 DIAGNOSIS — F419 Anxiety disorder, unspecified: Secondary | ICD-10-CM | POA: Diagnosis not present

## 2014-04-23 DIAGNOSIS — I129 Hypertensive chronic kidney disease with stage 1 through stage 4 chronic kidney disease, or unspecified chronic kidney disease: Secondary | ICD-10-CM | POA: Diagnosis not present

## 2014-04-23 DIAGNOSIS — F0280 Dementia in other diseases classified elsewhere without behavioral disturbance: Secondary | ICD-10-CM | POA: Diagnosis not present

## 2014-04-23 DIAGNOSIS — M1990 Unspecified osteoarthritis, unspecified site: Secondary | ICD-10-CM | POA: Diagnosis not present

## 2014-04-23 DIAGNOSIS — G309 Alzheimer's disease, unspecified: Secondary | ICD-10-CM | POA: Diagnosis not present

## 2014-04-23 DIAGNOSIS — S72141D Displaced intertrochanteric fracture of right femur, subsequent encounter for closed fracture with routine healing: Secondary | ICD-10-CM | POA: Diagnosis not present

## 2014-04-23 DIAGNOSIS — F329 Major depressive disorder, single episode, unspecified: Secondary | ICD-10-CM | POA: Diagnosis not present

## 2014-04-23 DIAGNOSIS — N183 Chronic kidney disease, stage 3 (moderate): Secondary | ICD-10-CM | POA: Diagnosis not present

## 2014-04-24 DIAGNOSIS — I129 Hypertensive chronic kidney disease with stage 1 through stage 4 chronic kidney disease, or unspecified chronic kidney disease: Secondary | ICD-10-CM | POA: Diagnosis not present

## 2014-04-24 DIAGNOSIS — S72141D Displaced intertrochanteric fracture of right femur, subsequent encounter for closed fracture with routine healing: Secondary | ICD-10-CM | POA: Diagnosis not present

## 2014-04-24 DIAGNOSIS — F329 Major depressive disorder, single episode, unspecified: Secondary | ICD-10-CM | POA: Diagnosis not present

## 2014-04-24 DIAGNOSIS — G309 Alzheimer's disease, unspecified: Secondary | ICD-10-CM | POA: Diagnosis not present

## 2014-04-24 DIAGNOSIS — F0280 Dementia in other diseases classified elsewhere without behavioral disturbance: Secondary | ICD-10-CM | POA: Diagnosis not present

## 2014-04-24 DIAGNOSIS — F419 Anxiety disorder, unspecified: Secondary | ICD-10-CM | POA: Diagnosis not present

## 2014-04-24 DIAGNOSIS — N183 Chronic kidney disease, stage 3 (moderate): Secondary | ICD-10-CM | POA: Diagnosis not present

## 2014-04-24 DIAGNOSIS — M1990 Unspecified osteoarthritis, unspecified site: Secondary | ICD-10-CM | POA: Diagnosis not present

## 2014-04-25 DIAGNOSIS — F419 Anxiety disorder, unspecified: Secondary | ICD-10-CM | POA: Diagnosis not present

## 2014-04-25 DIAGNOSIS — G309 Alzheimer's disease, unspecified: Secondary | ICD-10-CM | POA: Diagnosis not present

## 2014-04-25 DIAGNOSIS — F329 Major depressive disorder, single episode, unspecified: Secondary | ICD-10-CM | POA: Diagnosis not present

## 2014-04-25 DIAGNOSIS — F0280 Dementia in other diseases classified elsewhere without behavioral disturbance: Secondary | ICD-10-CM | POA: Diagnosis not present

## 2014-04-25 DIAGNOSIS — I129 Hypertensive chronic kidney disease with stage 1 through stage 4 chronic kidney disease, or unspecified chronic kidney disease: Secondary | ICD-10-CM | POA: Diagnosis not present

## 2014-04-25 DIAGNOSIS — S72141D Displaced intertrochanteric fracture of right femur, subsequent encounter for closed fracture with routine healing: Secondary | ICD-10-CM | POA: Diagnosis not present

## 2014-04-25 DIAGNOSIS — M1990 Unspecified osteoarthritis, unspecified site: Secondary | ICD-10-CM | POA: Diagnosis not present

## 2014-04-25 DIAGNOSIS — N183 Chronic kidney disease, stage 3 (moderate): Secondary | ICD-10-CM | POA: Diagnosis not present

## 2014-04-26 DIAGNOSIS — G309 Alzheimer's disease, unspecified: Secondary | ICD-10-CM | POA: Diagnosis not present

## 2014-04-27 DIAGNOSIS — G309 Alzheimer's disease, unspecified: Secondary | ICD-10-CM | POA: Diagnosis not present

## 2014-04-28 DIAGNOSIS — G309 Alzheimer's disease, unspecified: Secondary | ICD-10-CM | POA: Diagnosis not present

## 2014-04-29 DIAGNOSIS — F0280 Dementia in other diseases classified elsewhere without behavioral disturbance: Secondary | ICD-10-CM | POA: Diagnosis not present

## 2014-04-29 DIAGNOSIS — N183 Chronic kidney disease, stage 3 (moderate): Secondary | ICD-10-CM | POA: Diagnosis not present

## 2014-04-29 DIAGNOSIS — M1990 Unspecified osteoarthritis, unspecified site: Secondary | ICD-10-CM | POA: Diagnosis not present

## 2014-04-29 DIAGNOSIS — S72141D Displaced intertrochanteric fracture of right femur, subsequent encounter for closed fracture with routine healing: Secondary | ICD-10-CM | POA: Diagnosis not present

## 2014-04-29 DIAGNOSIS — I129 Hypertensive chronic kidney disease with stage 1 through stage 4 chronic kidney disease, or unspecified chronic kidney disease: Secondary | ICD-10-CM | POA: Diagnosis not present

## 2014-04-29 DIAGNOSIS — F329 Major depressive disorder, single episode, unspecified: Secondary | ICD-10-CM | POA: Diagnosis not present

## 2014-04-29 DIAGNOSIS — F419 Anxiety disorder, unspecified: Secondary | ICD-10-CM | POA: Diagnosis not present

## 2014-04-29 DIAGNOSIS — G309 Alzheimer's disease, unspecified: Secondary | ICD-10-CM | POA: Diagnosis not present

## 2014-04-30 DIAGNOSIS — G309 Alzheimer's disease, unspecified: Secondary | ICD-10-CM | POA: Diagnosis not present

## 2014-04-30 DIAGNOSIS — F329 Major depressive disorder, single episode, unspecified: Secondary | ICD-10-CM | POA: Diagnosis not present

## 2014-04-30 DIAGNOSIS — N183 Chronic kidney disease, stage 3 (moderate): Secondary | ICD-10-CM | POA: Diagnosis not present

## 2014-04-30 DIAGNOSIS — M1990 Unspecified osteoarthritis, unspecified site: Secondary | ICD-10-CM | POA: Diagnosis not present

## 2014-04-30 DIAGNOSIS — F419 Anxiety disorder, unspecified: Secondary | ICD-10-CM | POA: Diagnosis not present

## 2014-04-30 DIAGNOSIS — S72141D Displaced intertrochanteric fracture of right femur, subsequent encounter for closed fracture with routine healing: Secondary | ICD-10-CM | POA: Diagnosis not present

## 2014-04-30 DIAGNOSIS — F0280 Dementia in other diseases classified elsewhere without behavioral disturbance: Secondary | ICD-10-CM | POA: Diagnosis not present

## 2014-04-30 DIAGNOSIS — I129 Hypertensive chronic kidney disease with stage 1 through stage 4 chronic kidney disease, or unspecified chronic kidney disease: Secondary | ICD-10-CM | POA: Diagnosis not present

## 2014-05-01 DIAGNOSIS — G309 Alzheimer's disease, unspecified: Secondary | ICD-10-CM | POA: Diagnosis not present

## 2014-05-02 DIAGNOSIS — I129 Hypertensive chronic kidney disease with stage 1 through stage 4 chronic kidney disease, or unspecified chronic kidney disease: Secondary | ICD-10-CM | POA: Diagnosis not present

## 2014-05-02 DIAGNOSIS — M1990 Unspecified osteoarthritis, unspecified site: Secondary | ICD-10-CM | POA: Diagnosis not present

## 2014-05-02 DIAGNOSIS — G309 Alzheimer's disease, unspecified: Secondary | ICD-10-CM | POA: Diagnosis not present

## 2014-05-02 DIAGNOSIS — F329 Major depressive disorder, single episode, unspecified: Secondary | ICD-10-CM | POA: Diagnosis not present

## 2014-05-02 DIAGNOSIS — N183 Chronic kidney disease, stage 3 (moderate): Secondary | ICD-10-CM | POA: Diagnosis not present

## 2014-05-02 DIAGNOSIS — F419 Anxiety disorder, unspecified: Secondary | ICD-10-CM | POA: Diagnosis not present

## 2014-05-02 DIAGNOSIS — S72141D Displaced intertrochanteric fracture of right femur, subsequent encounter for closed fracture with routine healing: Secondary | ICD-10-CM | POA: Diagnosis not present

## 2014-05-02 DIAGNOSIS — F0280 Dementia in other diseases classified elsewhere without behavioral disturbance: Secondary | ICD-10-CM | POA: Diagnosis not present

## 2014-05-03 DIAGNOSIS — F419 Anxiety disorder, unspecified: Secondary | ICD-10-CM | POA: Diagnosis not present

## 2014-05-03 DIAGNOSIS — M1990 Unspecified osteoarthritis, unspecified site: Secondary | ICD-10-CM | POA: Diagnosis not present

## 2014-05-03 DIAGNOSIS — F0280 Dementia in other diseases classified elsewhere without behavioral disturbance: Secondary | ICD-10-CM | POA: Diagnosis not present

## 2014-05-03 DIAGNOSIS — I129 Hypertensive chronic kidney disease with stage 1 through stage 4 chronic kidney disease, or unspecified chronic kidney disease: Secondary | ICD-10-CM | POA: Diagnosis not present

## 2014-05-03 DIAGNOSIS — G309 Alzheimer's disease, unspecified: Secondary | ICD-10-CM | POA: Diagnosis not present

## 2014-05-03 DIAGNOSIS — N183 Chronic kidney disease, stage 3 (moderate): Secondary | ICD-10-CM | POA: Diagnosis not present

## 2014-05-03 DIAGNOSIS — S72141D Displaced intertrochanteric fracture of right femur, subsequent encounter for closed fracture with routine healing: Secondary | ICD-10-CM | POA: Diagnosis not present

## 2014-05-03 DIAGNOSIS — F329 Major depressive disorder, single episode, unspecified: Secondary | ICD-10-CM | POA: Diagnosis not present

## 2014-05-04 DIAGNOSIS — G309 Alzheimer's disease, unspecified: Secondary | ICD-10-CM | POA: Diagnosis not present

## 2014-05-05 DIAGNOSIS — G309 Alzheimer's disease, unspecified: Secondary | ICD-10-CM | POA: Diagnosis not present

## 2014-05-06 DIAGNOSIS — N183 Chronic kidney disease, stage 3 (moderate): Secondary | ICD-10-CM | POA: Diagnosis not present

## 2014-05-06 DIAGNOSIS — M1990 Unspecified osteoarthritis, unspecified site: Secondary | ICD-10-CM | POA: Diagnosis not present

## 2014-05-06 DIAGNOSIS — S72141D Displaced intertrochanteric fracture of right femur, subsequent encounter for closed fracture with routine healing: Secondary | ICD-10-CM | POA: Diagnosis not present

## 2014-05-06 DIAGNOSIS — I129 Hypertensive chronic kidney disease with stage 1 through stage 4 chronic kidney disease, or unspecified chronic kidney disease: Secondary | ICD-10-CM | POA: Diagnosis not present

## 2014-05-06 DIAGNOSIS — G309 Alzheimer's disease, unspecified: Secondary | ICD-10-CM | POA: Diagnosis not present

## 2014-05-06 DIAGNOSIS — F0280 Dementia in other diseases classified elsewhere without behavioral disturbance: Secondary | ICD-10-CM | POA: Diagnosis not present

## 2014-05-06 DIAGNOSIS — F419 Anxiety disorder, unspecified: Secondary | ICD-10-CM | POA: Diagnosis not present

## 2014-05-06 DIAGNOSIS — F329 Major depressive disorder, single episode, unspecified: Secondary | ICD-10-CM | POA: Diagnosis not present

## 2014-05-07 DIAGNOSIS — S72141D Displaced intertrochanteric fracture of right femur, subsequent encounter for closed fracture with routine healing: Secondary | ICD-10-CM | POA: Diagnosis not present

## 2014-05-07 DIAGNOSIS — G309 Alzheimer's disease, unspecified: Secondary | ICD-10-CM | POA: Diagnosis not present

## 2014-05-07 DIAGNOSIS — F0280 Dementia in other diseases classified elsewhere without behavioral disturbance: Secondary | ICD-10-CM | POA: Diagnosis not present

## 2014-05-07 DIAGNOSIS — M1990 Unspecified osteoarthritis, unspecified site: Secondary | ICD-10-CM | POA: Diagnosis not present

## 2014-05-07 DIAGNOSIS — N183 Chronic kidney disease, stage 3 (moderate): Secondary | ICD-10-CM | POA: Diagnosis not present

## 2014-05-07 DIAGNOSIS — F329 Major depressive disorder, single episode, unspecified: Secondary | ICD-10-CM | POA: Diagnosis not present

## 2014-05-07 DIAGNOSIS — F419 Anxiety disorder, unspecified: Secondary | ICD-10-CM | POA: Diagnosis not present

## 2014-05-07 DIAGNOSIS — I129 Hypertensive chronic kidney disease with stage 1 through stage 4 chronic kidney disease, or unspecified chronic kidney disease: Secondary | ICD-10-CM | POA: Diagnosis not present

## 2014-05-08 DIAGNOSIS — F0280 Dementia in other diseases classified elsewhere without behavioral disturbance: Secondary | ICD-10-CM | POA: Diagnosis not present

## 2014-05-08 DIAGNOSIS — F329 Major depressive disorder, single episode, unspecified: Secondary | ICD-10-CM | POA: Diagnosis not present

## 2014-05-08 DIAGNOSIS — N183 Chronic kidney disease, stage 3 (moderate): Secondary | ICD-10-CM | POA: Diagnosis not present

## 2014-05-08 DIAGNOSIS — M1990 Unspecified osteoarthritis, unspecified site: Secondary | ICD-10-CM | POA: Diagnosis not present

## 2014-05-08 DIAGNOSIS — S72141D Displaced intertrochanteric fracture of right femur, subsequent encounter for closed fracture with routine healing: Secondary | ICD-10-CM | POA: Diagnosis not present

## 2014-05-08 DIAGNOSIS — G309 Alzheimer's disease, unspecified: Secondary | ICD-10-CM | POA: Diagnosis not present

## 2014-05-08 DIAGNOSIS — F419 Anxiety disorder, unspecified: Secondary | ICD-10-CM | POA: Diagnosis not present

## 2014-05-08 DIAGNOSIS — I129 Hypertensive chronic kidney disease with stage 1 through stage 4 chronic kidney disease, or unspecified chronic kidney disease: Secondary | ICD-10-CM | POA: Diagnosis not present

## 2014-05-09 DIAGNOSIS — G309 Alzheimer's disease, unspecified: Secondary | ICD-10-CM | POA: Diagnosis not present

## 2014-05-10 DIAGNOSIS — N183 Chronic kidney disease, stage 3 (moderate): Secondary | ICD-10-CM | POA: Diagnosis not present

## 2014-05-10 DIAGNOSIS — M1990 Unspecified osteoarthritis, unspecified site: Secondary | ICD-10-CM | POA: Diagnosis not present

## 2014-05-10 DIAGNOSIS — F419 Anxiety disorder, unspecified: Secondary | ICD-10-CM | POA: Diagnosis not present

## 2014-05-10 DIAGNOSIS — G309 Alzheimer's disease, unspecified: Secondary | ICD-10-CM | POA: Diagnosis not present

## 2014-05-10 DIAGNOSIS — F329 Major depressive disorder, single episode, unspecified: Secondary | ICD-10-CM | POA: Diagnosis not present

## 2014-05-10 DIAGNOSIS — S72141D Displaced intertrochanteric fracture of right femur, subsequent encounter for closed fracture with routine healing: Secondary | ICD-10-CM | POA: Diagnosis not present

## 2014-05-10 DIAGNOSIS — I129 Hypertensive chronic kidney disease with stage 1 through stage 4 chronic kidney disease, or unspecified chronic kidney disease: Secondary | ICD-10-CM | POA: Diagnosis not present

## 2014-05-10 DIAGNOSIS — F0280 Dementia in other diseases classified elsewhere without behavioral disturbance: Secondary | ICD-10-CM | POA: Diagnosis not present

## 2014-05-11 DIAGNOSIS — G309 Alzheimer's disease, unspecified: Secondary | ICD-10-CM | POA: Diagnosis not present

## 2014-05-12 DIAGNOSIS — G309 Alzheimer's disease, unspecified: Secondary | ICD-10-CM | POA: Diagnosis not present

## 2014-05-13 DIAGNOSIS — G309 Alzheimer's disease, unspecified: Secondary | ICD-10-CM | POA: Diagnosis not present

## 2014-05-14 DIAGNOSIS — G309 Alzheimer's disease, unspecified: Secondary | ICD-10-CM | POA: Diagnosis not present

## 2014-05-15 DIAGNOSIS — G309 Alzheimer's disease, unspecified: Secondary | ICD-10-CM | POA: Diagnosis not present

## 2014-05-16 DIAGNOSIS — G309 Alzheimer's disease, unspecified: Secondary | ICD-10-CM | POA: Diagnosis not present

## 2014-05-17 DIAGNOSIS — G309 Alzheimer's disease, unspecified: Secondary | ICD-10-CM | POA: Diagnosis not present

## 2014-05-18 DIAGNOSIS — G309 Alzheimer's disease, unspecified: Secondary | ICD-10-CM | POA: Diagnosis not present

## 2014-05-19 DIAGNOSIS — G309 Alzheimer's disease, unspecified: Secondary | ICD-10-CM | POA: Diagnosis not present

## 2014-05-20 DIAGNOSIS — G309 Alzheimer's disease, unspecified: Secondary | ICD-10-CM | POA: Diagnosis not present

## 2014-05-21 DIAGNOSIS — G309 Alzheimer's disease, unspecified: Secondary | ICD-10-CM | POA: Diagnosis not present

## 2014-05-22 DIAGNOSIS — G309 Alzheimer's disease, unspecified: Secondary | ICD-10-CM | POA: Diagnosis not present

## 2014-05-23 DIAGNOSIS — G309 Alzheimer's disease, unspecified: Secondary | ICD-10-CM | POA: Diagnosis not present

## 2014-05-24 DIAGNOSIS — M79674 Pain in right toe(s): Secondary | ICD-10-CM | POA: Diagnosis not present

## 2014-05-24 DIAGNOSIS — B351 Tinea unguium: Secondary | ICD-10-CM | POA: Diagnosis not present

## 2014-05-24 DIAGNOSIS — M79675 Pain in left toe(s): Secondary | ICD-10-CM | POA: Diagnosis not present

## 2014-05-24 DIAGNOSIS — G309 Alzheimer's disease, unspecified: Secondary | ICD-10-CM | POA: Diagnosis not present

## 2014-05-25 DIAGNOSIS — G309 Alzheimer's disease, unspecified: Secondary | ICD-10-CM | POA: Diagnosis not present

## 2014-05-26 DIAGNOSIS — G309 Alzheimer's disease, unspecified: Secondary | ICD-10-CM | POA: Diagnosis not present

## 2014-05-27 ENCOUNTER — Ambulatory Visit (INDEPENDENT_AMBULATORY_CARE_PROVIDER_SITE_OTHER): Payer: Self-pay | Admitting: Orthopedic Surgery

## 2014-05-27 ENCOUNTER — Encounter: Payer: Self-pay | Admitting: Orthopedic Surgery

## 2014-05-27 ENCOUNTER — Ambulatory Visit (INDEPENDENT_AMBULATORY_CARE_PROVIDER_SITE_OTHER): Payer: Commercial Managed Care - HMO

## 2014-05-27 VITALS — BP 107/78 | Ht 63.0 in | Wt 185.0 lb

## 2014-05-27 DIAGNOSIS — S72141D Displaced intertrochanteric fracture of right femur, subsequent encounter for closed fracture with routine healing: Secondary | ICD-10-CM

## 2014-05-27 DIAGNOSIS — G309 Alzheimer's disease, unspecified: Secondary | ICD-10-CM | POA: Diagnosis not present

## 2014-05-27 NOTE — Progress Notes (Signed)
The patient had surgery on her right hip for inotropic fracture with a dynamic hip screw on 02/23/2014 she's 12 weeks plus postop  She's doing well her x-ray shows good fracture healing and she is ambulatory with assistance and with a walker.  She should continue therapy to regain mobility and strength but no further follow-up visits are needed. Complete fracture healing noted on x-ray.

## 2014-05-28 DIAGNOSIS — G309 Alzheimer's disease, unspecified: Secondary | ICD-10-CM | POA: Diagnosis not present

## 2014-05-29 DIAGNOSIS — N183 Chronic kidney disease, stage 3 (moderate): Secondary | ICD-10-CM | POA: Diagnosis not present

## 2014-05-29 DIAGNOSIS — I129 Hypertensive chronic kidney disease with stage 1 through stage 4 chronic kidney disease, or unspecified chronic kidney disease: Secondary | ICD-10-CM | POA: Diagnosis not present

## 2014-05-29 DIAGNOSIS — G309 Alzheimer's disease, unspecified: Secondary | ICD-10-CM | POA: Diagnosis not present

## 2014-05-29 DIAGNOSIS — F329 Major depressive disorder, single episode, unspecified: Secondary | ICD-10-CM | POA: Diagnosis not present

## 2014-05-29 DIAGNOSIS — F0280 Dementia in other diseases classified elsewhere without behavioral disturbance: Secondary | ICD-10-CM | POA: Diagnosis not present

## 2014-05-29 DIAGNOSIS — S7291XD Unspecified fracture of right femur, subsequent encounter for closed fracture with routine healing: Secondary | ICD-10-CM | POA: Diagnosis not present

## 2014-05-29 DIAGNOSIS — M1990 Unspecified osteoarthritis, unspecified site: Secondary | ICD-10-CM | POA: Diagnosis not present

## 2014-05-30 DIAGNOSIS — I10 Essential (primary) hypertension: Secondary | ICD-10-CM | POA: Diagnosis not present

## 2014-05-30 DIAGNOSIS — N183 Chronic kidney disease, stage 3 (moderate): Secondary | ICD-10-CM | POA: Diagnosis not present

## 2014-05-30 DIAGNOSIS — K219 Gastro-esophageal reflux disease without esophagitis: Secondary | ICD-10-CM | POA: Diagnosis not present

## 2014-05-30 DIAGNOSIS — G309 Alzheimer's disease, unspecified: Secondary | ICD-10-CM | POA: Diagnosis not present

## 2014-05-30 DIAGNOSIS — F0280 Dementia in other diseases classified elsewhere without behavioral disturbance: Secondary | ICD-10-CM | POA: Diagnosis not present

## 2014-05-31 DIAGNOSIS — G309 Alzheimer's disease, unspecified: Secondary | ICD-10-CM | POA: Diagnosis not present

## 2014-06-01 DIAGNOSIS — G309 Alzheimer's disease, unspecified: Secondary | ICD-10-CM | POA: Diagnosis not present

## 2014-06-02 DIAGNOSIS — G309 Alzheimer's disease, unspecified: Secondary | ICD-10-CM | POA: Diagnosis not present

## 2014-06-03 DIAGNOSIS — N183 Chronic kidney disease, stage 3 (moderate): Secondary | ICD-10-CM | POA: Diagnosis not present

## 2014-06-03 DIAGNOSIS — S7291XD Unspecified fracture of right femur, subsequent encounter for closed fracture with routine healing: Secondary | ICD-10-CM | POA: Diagnosis not present

## 2014-06-03 DIAGNOSIS — F0280 Dementia in other diseases classified elsewhere without behavioral disturbance: Secondary | ICD-10-CM | POA: Diagnosis not present

## 2014-06-03 DIAGNOSIS — M1990 Unspecified osteoarthritis, unspecified site: Secondary | ICD-10-CM | POA: Diagnosis not present

## 2014-06-03 DIAGNOSIS — G309 Alzheimer's disease, unspecified: Secondary | ICD-10-CM | POA: Diagnosis not present

## 2014-06-03 DIAGNOSIS — I129 Hypertensive chronic kidney disease with stage 1 through stage 4 chronic kidney disease, or unspecified chronic kidney disease: Secondary | ICD-10-CM | POA: Diagnosis not present

## 2014-06-03 DIAGNOSIS — F329 Major depressive disorder, single episode, unspecified: Secondary | ICD-10-CM | POA: Diagnosis not present

## 2014-06-04 DIAGNOSIS — G309 Alzheimer's disease, unspecified: Secondary | ICD-10-CM | POA: Diagnosis not present

## 2014-06-05 DIAGNOSIS — S7291XD Unspecified fracture of right femur, subsequent encounter for closed fracture with routine healing: Secondary | ICD-10-CM | POA: Diagnosis not present

## 2014-06-05 DIAGNOSIS — G309 Alzheimer's disease, unspecified: Secondary | ICD-10-CM | POA: Diagnosis not present

## 2014-06-05 DIAGNOSIS — N183 Chronic kidney disease, stage 3 (moderate): Secondary | ICD-10-CM | POA: Diagnosis not present

## 2014-06-05 DIAGNOSIS — I129 Hypertensive chronic kidney disease with stage 1 through stage 4 chronic kidney disease, or unspecified chronic kidney disease: Secondary | ICD-10-CM | POA: Diagnosis not present

## 2014-06-05 DIAGNOSIS — M1990 Unspecified osteoarthritis, unspecified site: Secondary | ICD-10-CM | POA: Diagnosis not present

## 2014-06-05 DIAGNOSIS — F0280 Dementia in other diseases classified elsewhere without behavioral disturbance: Secondary | ICD-10-CM | POA: Diagnosis not present

## 2014-06-05 DIAGNOSIS — F329 Major depressive disorder, single episode, unspecified: Secondary | ICD-10-CM | POA: Diagnosis not present

## 2014-06-06 DIAGNOSIS — G309 Alzheimer's disease, unspecified: Secondary | ICD-10-CM | POA: Diagnosis not present

## 2014-06-07 DIAGNOSIS — N183 Chronic kidney disease, stage 3 (moderate): Secondary | ICD-10-CM | POA: Diagnosis not present

## 2014-06-07 DIAGNOSIS — F0280 Dementia in other diseases classified elsewhere without behavioral disturbance: Secondary | ICD-10-CM | POA: Diagnosis not present

## 2014-06-07 DIAGNOSIS — M1990 Unspecified osteoarthritis, unspecified site: Secondary | ICD-10-CM | POA: Diagnosis not present

## 2014-06-07 DIAGNOSIS — G309 Alzheimer's disease, unspecified: Secondary | ICD-10-CM | POA: Diagnosis not present

## 2014-06-07 DIAGNOSIS — S7291XD Unspecified fracture of right femur, subsequent encounter for closed fracture with routine healing: Secondary | ICD-10-CM | POA: Diagnosis not present

## 2014-06-07 DIAGNOSIS — I129 Hypertensive chronic kidney disease with stage 1 through stage 4 chronic kidney disease, or unspecified chronic kidney disease: Secondary | ICD-10-CM | POA: Diagnosis not present

## 2014-06-07 DIAGNOSIS — F329 Major depressive disorder, single episode, unspecified: Secondary | ICD-10-CM | POA: Diagnosis not present

## 2014-06-08 DIAGNOSIS — G309 Alzheimer's disease, unspecified: Secondary | ICD-10-CM | POA: Diagnosis not present

## 2014-06-09 DIAGNOSIS — G309 Alzheimer's disease, unspecified: Secondary | ICD-10-CM | POA: Diagnosis not present

## 2014-06-10 DIAGNOSIS — G309 Alzheimer's disease, unspecified: Secondary | ICD-10-CM | POA: Diagnosis not present

## 2014-06-11 DIAGNOSIS — S7291XD Unspecified fracture of right femur, subsequent encounter for closed fracture with routine healing: Secondary | ICD-10-CM | POA: Diagnosis not present

## 2014-06-11 DIAGNOSIS — G309 Alzheimer's disease, unspecified: Secondary | ICD-10-CM | POA: Diagnosis not present

## 2014-06-12 DIAGNOSIS — I129 Hypertensive chronic kidney disease with stage 1 through stage 4 chronic kidney disease, or unspecified chronic kidney disease: Secondary | ICD-10-CM | POA: Diagnosis not present

## 2014-06-12 DIAGNOSIS — N183 Chronic kidney disease, stage 3 (moderate): Secondary | ICD-10-CM | POA: Diagnosis not present

## 2014-06-12 DIAGNOSIS — G309 Alzheimer's disease, unspecified: Secondary | ICD-10-CM | POA: Diagnosis not present

## 2014-06-12 DIAGNOSIS — M1990 Unspecified osteoarthritis, unspecified site: Secondary | ICD-10-CM | POA: Diagnosis not present

## 2014-06-12 DIAGNOSIS — F329 Major depressive disorder, single episode, unspecified: Secondary | ICD-10-CM | POA: Diagnosis not present

## 2014-06-12 DIAGNOSIS — F0280 Dementia in other diseases classified elsewhere without behavioral disturbance: Secondary | ICD-10-CM | POA: Diagnosis not present

## 2014-06-12 DIAGNOSIS — S7291XD Unspecified fracture of right femur, subsequent encounter for closed fracture with routine healing: Secondary | ICD-10-CM | POA: Diagnosis not present

## 2014-06-13 DIAGNOSIS — N183 Chronic kidney disease, stage 3 (moderate): Secondary | ICD-10-CM | POA: Diagnosis not present

## 2014-06-13 DIAGNOSIS — S7291XD Unspecified fracture of right femur, subsequent encounter for closed fracture with routine healing: Secondary | ICD-10-CM | POA: Diagnosis not present

## 2014-06-13 DIAGNOSIS — F0280 Dementia in other diseases classified elsewhere without behavioral disturbance: Secondary | ICD-10-CM | POA: Diagnosis not present

## 2014-06-13 DIAGNOSIS — M1990 Unspecified osteoarthritis, unspecified site: Secondary | ICD-10-CM | POA: Diagnosis not present

## 2014-06-13 DIAGNOSIS — G309 Alzheimer's disease, unspecified: Secondary | ICD-10-CM | POA: Diagnosis not present

## 2014-06-13 DIAGNOSIS — I129 Hypertensive chronic kidney disease with stage 1 through stage 4 chronic kidney disease, or unspecified chronic kidney disease: Secondary | ICD-10-CM | POA: Diagnosis not present

## 2014-06-13 DIAGNOSIS — F329 Major depressive disorder, single episode, unspecified: Secondary | ICD-10-CM | POA: Diagnosis not present

## 2014-06-14 DIAGNOSIS — G309 Alzheimer's disease, unspecified: Secondary | ICD-10-CM | POA: Diagnosis not present

## 2014-06-14 DIAGNOSIS — F329 Major depressive disorder, single episode, unspecified: Secondary | ICD-10-CM | POA: Diagnosis not present

## 2014-06-14 DIAGNOSIS — M1990 Unspecified osteoarthritis, unspecified site: Secondary | ICD-10-CM | POA: Diagnosis not present

## 2014-06-14 DIAGNOSIS — S7291XD Unspecified fracture of right femur, subsequent encounter for closed fracture with routine healing: Secondary | ICD-10-CM | POA: Diagnosis not present

## 2014-06-14 DIAGNOSIS — I129 Hypertensive chronic kidney disease with stage 1 through stage 4 chronic kidney disease, or unspecified chronic kidney disease: Secondary | ICD-10-CM | POA: Diagnosis not present

## 2014-06-14 DIAGNOSIS — F0280 Dementia in other diseases classified elsewhere without behavioral disturbance: Secondary | ICD-10-CM | POA: Diagnosis not present

## 2014-06-14 DIAGNOSIS — N183 Chronic kidney disease, stage 3 (moderate): Secondary | ICD-10-CM | POA: Diagnosis not present

## 2014-06-15 DIAGNOSIS — G309 Alzheimer's disease, unspecified: Secondary | ICD-10-CM | POA: Diagnosis not present

## 2014-06-16 DIAGNOSIS — G309 Alzheimer's disease, unspecified: Secondary | ICD-10-CM | POA: Diagnosis not present

## 2014-06-17 DIAGNOSIS — F329 Major depressive disorder, single episode, unspecified: Secondary | ICD-10-CM | POA: Diagnosis not present

## 2014-06-17 DIAGNOSIS — F0280 Dementia in other diseases classified elsewhere without behavioral disturbance: Secondary | ICD-10-CM | POA: Diagnosis not present

## 2014-06-17 DIAGNOSIS — N183 Chronic kidney disease, stage 3 (moderate): Secondary | ICD-10-CM | POA: Diagnosis not present

## 2014-06-17 DIAGNOSIS — S7291XD Unspecified fracture of right femur, subsequent encounter for closed fracture with routine healing: Secondary | ICD-10-CM | POA: Diagnosis not present

## 2014-06-17 DIAGNOSIS — I129 Hypertensive chronic kidney disease with stage 1 through stage 4 chronic kidney disease, or unspecified chronic kidney disease: Secondary | ICD-10-CM | POA: Diagnosis not present

## 2014-06-17 DIAGNOSIS — M1990 Unspecified osteoarthritis, unspecified site: Secondary | ICD-10-CM | POA: Diagnosis not present

## 2014-06-17 DIAGNOSIS — G309 Alzheimer's disease, unspecified: Secondary | ICD-10-CM | POA: Diagnosis not present

## 2014-06-18 DIAGNOSIS — G309 Alzheimer's disease, unspecified: Secondary | ICD-10-CM | POA: Diagnosis not present

## 2014-06-19 DIAGNOSIS — S7291XD Unspecified fracture of right femur, subsequent encounter for closed fracture with routine healing: Secondary | ICD-10-CM | POA: Diagnosis not present

## 2014-06-19 DIAGNOSIS — F0280 Dementia in other diseases classified elsewhere without behavioral disturbance: Secondary | ICD-10-CM | POA: Diagnosis not present

## 2014-06-19 DIAGNOSIS — G309 Alzheimer's disease, unspecified: Secondary | ICD-10-CM | POA: Diagnosis not present

## 2014-06-19 DIAGNOSIS — F329 Major depressive disorder, single episode, unspecified: Secondary | ICD-10-CM | POA: Diagnosis not present

## 2014-06-19 DIAGNOSIS — M1990 Unspecified osteoarthritis, unspecified site: Secondary | ICD-10-CM | POA: Diagnosis not present

## 2014-06-19 DIAGNOSIS — I129 Hypertensive chronic kidney disease with stage 1 through stage 4 chronic kidney disease, or unspecified chronic kidney disease: Secondary | ICD-10-CM | POA: Diagnosis not present

## 2014-06-19 DIAGNOSIS — N183 Chronic kidney disease, stage 3 (moderate): Secondary | ICD-10-CM | POA: Diagnosis not present

## 2014-06-20 DIAGNOSIS — G309 Alzheimer's disease, unspecified: Secondary | ICD-10-CM | POA: Diagnosis not present

## 2014-06-21 DIAGNOSIS — F0280 Dementia in other diseases classified elsewhere without behavioral disturbance: Secondary | ICD-10-CM | POA: Diagnosis not present

## 2014-06-21 DIAGNOSIS — M1990 Unspecified osteoarthritis, unspecified site: Secondary | ICD-10-CM | POA: Diagnosis not present

## 2014-06-21 DIAGNOSIS — F329 Major depressive disorder, single episode, unspecified: Secondary | ICD-10-CM | POA: Diagnosis not present

## 2014-06-21 DIAGNOSIS — G309 Alzheimer's disease, unspecified: Secondary | ICD-10-CM | POA: Diagnosis not present

## 2014-06-21 DIAGNOSIS — I129 Hypertensive chronic kidney disease with stage 1 through stage 4 chronic kidney disease, or unspecified chronic kidney disease: Secondary | ICD-10-CM | POA: Diagnosis not present

## 2014-06-21 DIAGNOSIS — S7291XD Unspecified fracture of right femur, subsequent encounter for closed fracture with routine healing: Secondary | ICD-10-CM | POA: Diagnosis not present

## 2014-06-21 DIAGNOSIS — N183 Chronic kidney disease, stage 3 (moderate): Secondary | ICD-10-CM | POA: Diagnosis not present

## 2014-06-22 DIAGNOSIS — G309 Alzheimer's disease, unspecified: Secondary | ICD-10-CM | POA: Diagnosis not present

## 2014-06-23 DIAGNOSIS — G309 Alzheimer's disease, unspecified: Secondary | ICD-10-CM | POA: Diagnosis not present

## 2014-06-24 DIAGNOSIS — I129 Hypertensive chronic kidney disease with stage 1 through stage 4 chronic kidney disease, or unspecified chronic kidney disease: Secondary | ICD-10-CM | POA: Diagnosis not present

## 2014-06-24 DIAGNOSIS — S7291XD Unspecified fracture of right femur, subsequent encounter for closed fracture with routine healing: Secondary | ICD-10-CM | POA: Diagnosis not present

## 2014-06-24 DIAGNOSIS — N183 Chronic kidney disease, stage 3 (moderate): Secondary | ICD-10-CM | POA: Diagnosis not present

## 2014-06-24 DIAGNOSIS — F0280 Dementia in other diseases classified elsewhere without behavioral disturbance: Secondary | ICD-10-CM | POA: Diagnosis not present

## 2014-06-24 DIAGNOSIS — G309 Alzheimer's disease, unspecified: Secondary | ICD-10-CM | POA: Diagnosis not present

## 2014-06-24 DIAGNOSIS — M1990 Unspecified osteoarthritis, unspecified site: Secondary | ICD-10-CM | POA: Diagnosis not present

## 2014-06-24 DIAGNOSIS — F329 Major depressive disorder, single episode, unspecified: Secondary | ICD-10-CM | POA: Diagnosis not present

## 2014-06-25 DIAGNOSIS — G309 Alzheimer's disease, unspecified: Secondary | ICD-10-CM | POA: Diagnosis not present

## 2014-06-26 DIAGNOSIS — M1990 Unspecified osteoarthritis, unspecified site: Secondary | ICD-10-CM | POA: Diagnosis not present

## 2014-06-26 DIAGNOSIS — S7291XD Unspecified fracture of right femur, subsequent encounter for closed fracture with routine healing: Secondary | ICD-10-CM | POA: Diagnosis not present

## 2014-06-26 DIAGNOSIS — N183 Chronic kidney disease, stage 3 (moderate): Secondary | ICD-10-CM | POA: Diagnosis not present

## 2014-06-26 DIAGNOSIS — F329 Major depressive disorder, single episode, unspecified: Secondary | ICD-10-CM | POA: Diagnosis not present

## 2014-06-26 DIAGNOSIS — G309 Alzheimer's disease, unspecified: Secondary | ICD-10-CM | POA: Diagnosis not present

## 2014-06-26 DIAGNOSIS — F0280 Dementia in other diseases classified elsewhere without behavioral disturbance: Secondary | ICD-10-CM | POA: Diagnosis not present

## 2014-06-26 DIAGNOSIS — I129 Hypertensive chronic kidney disease with stage 1 through stage 4 chronic kidney disease, or unspecified chronic kidney disease: Secondary | ICD-10-CM | POA: Diagnosis not present

## 2014-06-27 ENCOUNTER — Telehealth: Payer: Self-pay | Admitting: *Deleted

## 2014-06-27 DIAGNOSIS — Z13820 Encounter for screening for osteoporosis: Secondary | ICD-10-CM

## 2014-06-27 DIAGNOSIS — S72141D Displaced intertrochanteric fracture of right femur, subsequent encounter for closed fracture with routine healing: Secondary | ICD-10-CM

## 2014-06-27 DIAGNOSIS — G309 Alzheimer's disease, unspecified: Secondary | ICD-10-CM | POA: Diagnosis not present

## 2014-06-27 NOTE — Telephone Encounter (Signed)
Chyrl CivatteJoann, RN from [facility] stating that she faxed Dr. Milinda CaveMcGowen a form for Screening BMD (Dx: Screening for Osteoporosis) due to fracture pt had on 02/22/14. She stated that per [employer] guidelines pt should have BMD after fracture if no recent BMD has been done (recent past 2 years). She states that pts last BMD was in 07/2011. CB: [phone] Please advised. Thanks.

## 2014-06-28 DIAGNOSIS — G309 Alzheimer's disease, unspecified: Secondary | ICD-10-CM | POA: Diagnosis not present

## 2014-06-28 DIAGNOSIS — F0280 Dementia in other diseases classified elsewhere without behavioral disturbance: Secondary | ICD-10-CM | POA: Diagnosis not present

## 2014-06-28 DIAGNOSIS — N183 Chronic kidney disease, stage 3 (moderate): Secondary | ICD-10-CM | POA: Diagnosis not present

## 2014-06-28 DIAGNOSIS — I129 Hypertensive chronic kidney disease with stage 1 through stage 4 chronic kidney disease, or unspecified chronic kidney disease: Secondary | ICD-10-CM | POA: Diagnosis not present

## 2014-06-28 DIAGNOSIS — M1990 Unspecified osteoarthritis, unspecified site: Secondary | ICD-10-CM | POA: Diagnosis not present

## 2014-06-28 DIAGNOSIS — S7291XD Unspecified fracture of right femur, subsequent encounter for closed fracture with routine healing: Secondary | ICD-10-CM | POA: Diagnosis not present

## 2014-06-28 DIAGNOSIS — F329 Major depressive disorder, single episode, unspecified: Secondary | ICD-10-CM | POA: Diagnosis not present

## 2014-06-29 DIAGNOSIS — G309 Alzheimer's disease, unspecified: Secondary | ICD-10-CM | POA: Diagnosis not present

## 2014-06-30 DIAGNOSIS — G309 Alzheimer's disease, unspecified: Secondary | ICD-10-CM | POA: Diagnosis not present

## 2014-07-01 DIAGNOSIS — F0280 Dementia in other diseases classified elsewhere without behavioral disturbance: Secondary | ICD-10-CM | POA: Diagnosis not present

## 2014-07-01 DIAGNOSIS — F329 Major depressive disorder, single episode, unspecified: Secondary | ICD-10-CM | POA: Diagnosis not present

## 2014-07-01 DIAGNOSIS — N183 Chronic kidney disease, stage 3 (moderate): Secondary | ICD-10-CM | POA: Diagnosis not present

## 2014-07-01 DIAGNOSIS — S7291XD Unspecified fracture of right femur, subsequent encounter for closed fracture with routine healing: Secondary | ICD-10-CM | POA: Diagnosis not present

## 2014-07-01 DIAGNOSIS — M1990 Unspecified osteoarthritis, unspecified site: Secondary | ICD-10-CM | POA: Diagnosis not present

## 2014-07-01 DIAGNOSIS — G309 Alzheimer's disease, unspecified: Secondary | ICD-10-CM | POA: Diagnosis not present

## 2014-07-01 DIAGNOSIS — I129 Hypertensive chronic kidney disease with stage 1 through stage 4 chronic kidney disease, or unspecified chronic kidney disease: Secondary | ICD-10-CM | POA: Diagnosis not present

## 2014-07-02 DIAGNOSIS — G309 Alzheimer's disease, unspecified: Secondary | ICD-10-CM | POA: Diagnosis not present

## 2014-07-03 DIAGNOSIS — F329 Major depressive disorder, single episode, unspecified: Secondary | ICD-10-CM | POA: Diagnosis not present

## 2014-07-03 DIAGNOSIS — M1990 Unspecified osteoarthritis, unspecified site: Secondary | ICD-10-CM | POA: Diagnosis not present

## 2014-07-03 DIAGNOSIS — N183 Chronic kidney disease, stage 3 (moderate): Secondary | ICD-10-CM | POA: Diagnosis not present

## 2014-07-03 DIAGNOSIS — G309 Alzheimer's disease, unspecified: Secondary | ICD-10-CM | POA: Diagnosis not present

## 2014-07-03 DIAGNOSIS — S7291XD Unspecified fracture of right femur, subsequent encounter for closed fracture with routine healing: Secondary | ICD-10-CM | POA: Diagnosis not present

## 2014-07-03 DIAGNOSIS — F0280 Dementia in other diseases classified elsewhere without behavioral disturbance: Secondary | ICD-10-CM | POA: Diagnosis not present

## 2014-07-03 DIAGNOSIS — I129 Hypertensive chronic kidney disease with stage 1 through stage 4 chronic kidney disease, or unspecified chronic kidney disease: Secondary | ICD-10-CM | POA: Diagnosis not present

## 2014-07-03 NOTE — Telephone Encounter (Signed)
OK, DEXA scan has been ordered for [place] on [address]. Pt has dementia so her son will have to be the one contacted about this.-thx

## 2014-07-04 DIAGNOSIS — G309 Alzheimer's disease, unspecified: Secondary | ICD-10-CM | POA: Diagnosis not present

## 2014-07-05 DIAGNOSIS — F329 Major depressive disorder, single episode, unspecified: Secondary | ICD-10-CM | POA: Diagnosis not present

## 2014-07-05 DIAGNOSIS — N183 Chronic kidney disease, stage 3 (moderate): Secondary | ICD-10-CM | POA: Diagnosis not present

## 2014-07-05 DIAGNOSIS — G309 Alzheimer's disease, unspecified: Secondary | ICD-10-CM | POA: Diagnosis not present

## 2014-07-05 DIAGNOSIS — S7291XD Unspecified fracture of right femur, subsequent encounter for closed fracture with routine healing: Secondary | ICD-10-CM | POA: Diagnosis not present

## 2014-07-05 DIAGNOSIS — F0280 Dementia in other diseases classified elsewhere without behavioral disturbance: Secondary | ICD-10-CM | POA: Diagnosis not present

## 2014-07-05 DIAGNOSIS — I129 Hypertensive chronic kidney disease with stage 1 through stage 4 chronic kidney disease, or unspecified chronic kidney disease: Secondary | ICD-10-CM | POA: Diagnosis not present

## 2014-07-05 DIAGNOSIS — M1990 Unspecified osteoarthritis, unspecified site: Secondary | ICD-10-CM | POA: Diagnosis not present

## 2014-07-06 DIAGNOSIS — G309 Alzheimer's disease, unspecified: Secondary | ICD-10-CM | POA: Diagnosis not present

## 2014-07-07 DIAGNOSIS — G309 Alzheimer's disease, unspecified: Secondary | ICD-10-CM | POA: Diagnosis not present

## 2014-07-08 DIAGNOSIS — G309 Alzheimer's disease, unspecified: Secondary | ICD-10-CM | POA: Diagnosis not present

## 2014-07-09 DIAGNOSIS — G309 Alzheimer's disease, unspecified: Secondary | ICD-10-CM | POA: Diagnosis not present

## 2014-07-10 DIAGNOSIS — F0280 Dementia in other diseases classified elsewhere without behavioral disturbance: Secondary | ICD-10-CM | POA: Diagnosis not present

## 2014-07-10 DIAGNOSIS — F329 Major depressive disorder, single episode, unspecified: Secondary | ICD-10-CM | POA: Diagnosis not present

## 2014-07-10 DIAGNOSIS — M1990 Unspecified osteoarthritis, unspecified site: Secondary | ICD-10-CM | POA: Diagnosis not present

## 2014-07-10 DIAGNOSIS — I129 Hypertensive chronic kidney disease with stage 1 through stage 4 chronic kidney disease, or unspecified chronic kidney disease: Secondary | ICD-10-CM | POA: Diagnosis not present

## 2014-07-10 DIAGNOSIS — G309 Alzheimer's disease, unspecified: Secondary | ICD-10-CM | POA: Diagnosis not present

## 2014-07-10 DIAGNOSIS — S7291XD Unspecified fracture of right femur, subsequent encounter for closed fracture with routine healing: Secondary | ICD-10-CM | POA: Diagnosis not present

## 2014-07-10 DIAGNOSIS — N183 Chronic kidney disease, stage 3 (moderate): Secondary | ICD-10-CM | POA: Diagnosis not present

## 2014-07-11 DIAGNOSIS — I129 Hypertensive chronic kidney disease with stage 1 through stage 4 chronic kidney disease, or unspecified chronic kidney disease: Secondary | ICD-10-CM | POA: Diagnosis not present

## 2014-07-11 DIAGNOSIS — F0280 Dementia in other diseases classified elsewhere without behavioral disturbance: Secondary | ICD-10-CM | POA: Diagnosis not present

## 2014-07-11 DIAGNOSIS — N183 Chronic kidney disease, stage 3 (moderate): Secondary | ICD-10-CM | POA: Diagnosis not present

## 2014-07-11 DIAGNOSIS — M1990 Unspecified osteoarthritis, unspecified site: Secondary | ICD-10-CM | POA: Diagnosis not present

## 2014-07-11 DIAGNOSIS — F329 Major depressive disorder, single episode, unspecified: Secondary | ICD-10-CM | POA: Diagnosis not present

## 2014-07-11 DIAGNOSIS — S7291XD Unspecified fracture of right femur, subsequent encounter for closed fracture with routine healing: Secondary | ICD-10-CM | POA: Diagnosis not present

## 2014-07-11 DIAGNOSIS — G309 Alzheimer's disease, unspecified: Secondary | ICD-10-CM | POA: Diagnosis not present

## 2014-07-12 DIAGNOSIS — J309 Allergic rhinitis, unspecified: Secondary | ICD-10-CM | POA: Diagnosis not present

## 2014-07-12 DIAGNOSIS — F0390 Unspecified dementia without behavioral disturbance: Secondary | ICD-10-CM | POA: Diagnosis not present

## 2014-07-12 DIAGNOSIS — G309 Alzheimer's disease, unspecified: Secondary | ICD-10-CM | POA: Diagnosis not present

## 2014-07-12 DIAGNOSIS — I10 Essential (primary) hypertension: Secondary | ICD-10-CM | POA: Diagnosis not present

## 2014-07-12 DIAGNOSIS — K219 Gastro-esophageal reflux disease without esophagitis: Secondary | ICD-10-CM | POA: Diagnosis not present

## 2014-07-13 DIAGNOSIS — G309 Alzheimer's disease, unspecified: Secondary | ICD-10-CM | POA: Diagnosis not present

## 2014-07-14 DIAGNOSIS — G309 Alzheimer's disease, unspecified: Secondary | ICD-10-CM | POA: Diagnosis not present

## 2014-07-15 DIAGNOSIS — G309 Alzheimer's disease, unspecified: Secondary | ICD-10-CM | POA: Diagnosis not present

## 2014-07-16 ENCOUNTER — Inpatient Hospital Stay: Admission: RE | Admit: 2014-07-16 | Payer: Commercial Managed Care - HMO | Source: Ambulatory Visit

## 2014-07-16 DIAGNOSIS — G309 Alzheimer's disease, unspecified: Secondary | ICD-10-CM | POA: Diagnosis not present

## 2014-07-17 DIAGNOSIS — M1990 Unspecified osteoarthritis, unspecified site: Secondary | ICD-10-CM | POA: Diagnosis not present

## 2014-07-17 DIAGNOSIS — S7291XD Unspecified fracture of right femur, subsequent encounter for closed fracture with routine healing: Secondary | ICD-10-CM | POA: Diagnosis not present

## 2014-07-17 DIAGNOSIS — I129 Hypertensive chronic kidney disease with stage 1 through stage 4 chronic kidney disease, or unspecified chronic kidney disease: Secondary | ICD-10-CM | POA: Diagnosis not present

## 2014-07-17 DIAGNOSIS — F0280 Dementia in other diseases classified elsewhere without behavioral disturbance: Secondary | ICD-10-CM | POA: Diagnosis not present

## 2014-07-17 DIAGNOSIS — G309 Alzheimer's disease, unspecified: Secondary | ICD-10-CM | POA: Diagnosis not present

## 2014-07-17 DIAGNOSIS — N183 Chronic kidney disease, stage 3 (moderate): Secondary | ICD-10-CM | POA: Diagnosis not present

## 2014-07-17 DIAGNOSIS — F329 Major depressive disorder, single episode, unspecified: Secondary | ICD-10-CM | POA: Diagnosis not present

## 2014-07-18 DIAGNOSIS — G309 Alzheimer's disease, unspecified: Secondary | ICD-10-CM | POA: Diagnosis not present

## 2014-07-19 DIAGNOSIS — G309 Alzheimer's disease, unspecified: Secondary | ICD-10-CM | POA: Diagnosis not present

## 2014-07-19 DIAGNOSIS — I129 Hypertensive chronic kidney disease with stage 1 through stage 4 chronic kidney disease, or unspecified chronic kidney disease: Secondary | ICD-10-CM | POA: Diagnosis not present

## 2014-07-19 DIAGNOSIS — M1990 Unspecified osteoarthritis, unspecified site: Secondary | ICD-10-CM | POA: Diagnosis not present

## 2014-07-19 DIAGNOSIS — S7291XD Unspecified fracture of right femur, subsequent encounter for closed fracture with routine healing: Secondary | ICD-10-CM | POA: Diagnosis not present

## 2014-07-19 DIAGNOSIS — N183 Chronic kidney disease, stage 3 (moderate): Secondary | ICD-10-CM | POA: Diagnosis not present

## 2014-07-19 DIAGNOSIS — F0280 Dementia in other diseases classified elsewhere without behavioral disturbance: Secondary | ICD-10-CM | POA: Diagnosis not present

## 2014-07-19 DIAGNOSIS — F329 Major depressive disorder, single episode, unspecified: Secondary | ICD-10-CM | POA: Diagnosis not present

## 2014-07-20 DIAGNOSIS — G309 Alzheimer's disease, unspecified: Secondary | ICD-10-CM | POA: Diagnosis not present

## 2014-07-21 DIAGNOSIS — G309 Alzheimer's disease, unspecified: Secondary | ICD-10-CM | POA: Diagnosis not present

## 2014-07-22 DIAGNOSIS — F329 Major depressive disorder, single episode, unspecified: Secondary | ICD-10-CM | POA: Diagnosis not present

## 2014-07-22 DIAGNOSIS — F0280 Dementia in other diseases classified elsewhere without behavioral disturbance: Secondary | ICD-10-CM | POA: Diagnosis not present

## 2014-07-22 DIAGNOSIS — G309 Alzheimer's disease, unspecified: Secondary | ICD-10-CM | POA: Diagnosis not present

## 2014-07-22 DIAGNOSIS — M1990 Unspecified osteoarthritis, unspecified site: Secondary | ICD-10-CM | POA: Diagnosis not present

## 2014-07-22 DIAGNOSIS — S7291XD Unspecified fracture of right femur, subsequent encounter for closed fracture with routine healing: Secondary | ICD-10-CM | POA: Diagnosis not present

## 2014-07-22 DIAGNOSIS — N183 Chronic kidney disease, stage 3 (moderate): Secondary | ICD-10-CM | POA: Diagnosis not present

## 2014-07-22 DIAGNOSIS — I129 Hypertensive chronic kidney disease with stage 1 through stage 4 chronic kidney disease, or unspecified chronic kidney disease: Secondary | ICD-10-CM | POA: Diagnosis not present

## 2014-07-23 DIAGNOSIS — G309 Alzheimer's disease, unspecified: Secondary | ICD-10-CM | POA: Diagnosis not present

## 2014-07-24 DIAGNOSIS — G309 Alzheimer's disease, unspecified: Secondary | ICD-10-CM | POA: Diagnosis not present

## 2014-07-24 DIAGNOSIS — F329 Major depressive disorder, single episode, unspecified: Secondary | ICD-10-CM | POA: Diagnosis not present

## 2014-07-24 DIAGNOSIS — M1990 Unspecified osteoarthritis, unspecified site: Secondary | ICD-10-CM | POA: Diagnosis not present

## 2014-07-24 DIAGNOSIS — I129 Hypertensive chronic kidney disease with stage 1 through stage 4 chronic kidney disease, or unspecified chronic kidney disease: Secondary | ICD-10-CM | POA: Diagnosis not present

## 2014-07-24 DIAGNOSIS — N183 Chronic kidney disease, stage 3 (moderate): Secondary | ICD-10-CM | POA: Diagnosis not present

## 2014-07-24 DIAGNOSIS — S7291XD Unspecified fracture of right femur, subsequent encounter for closed fracture with routine healing: Secondary | ICD-10-CM | POA: Diagnosis not present

## 2014-07-24 DIAGNOSIS — F0280 Dementia in other diseases classified elsewhere without behavioral disturbance: Secondary | ICD-10-CM | POA: Diagnosis not present

## 2014-07-25 ENCOUNTER — Ambulatory Visit: Payer: Medicare HMO | Admitting: Family Medicine

## 2014-07-25 DIAGNOSIS — G309 Alzheimer's disease, unspecified: Secondary | ICD-10-CM | POA: Diagnosis not present

## 2014-07-26 DIAGNOSIS — G309 Alzheimer's disease, unspecified: Secondary | ICD-10-CM | POA: Diagnosis not present

## 2014-07-27 DIAGNOSIS — G309 Alzheimer's disease, unspecified: Secondary | ICD-10-CM | POA: Diagnosis not present

## 2014-07-28 DIAGNOSIS — G309 Alzheimer's disease, unspecified: Secondary | ICD-10-CM | POA: Diagnosis not present

## 2014-07-29 DIAGNOSIS — G309 Alzheimer's disease, unspecified: Secondary | ICD-10-CM | POA: Diagnosis not present

## 2014-07-30 DIAGNOSIS — G309 Alzheimer's disease, unspecified: Secondary | ICD-10-CM | POA: Diagnosis not present

## 2014-07-31 DIAGNOSIS — G309 Alzheimer's disease, unspecified: Secondary | ICD-10-CM | POA: Diagnosis not present

## 2014-08-01 DIAGNOSIS — G309 Alzheimer's disease, unspecified: Secondary | ICD-10-CM | POA: Diagnosis not present

## 2014-08-02 DIAGNOSIS — G309 Alzheimer's disease, unspecified: Secondary | ICD-10-CM | POA: Diagnosis not present

## 2014-08-03 DIAGNOSIS — G309 Alzheimer's disease, unspecified: Secondary | ICD-10-CM | POA: Diagnosis not present

## 2014-08-04 DIAGNOSIS — G309 Alzheimer's disease, unspecified: Secondary | ICD-10-CM | POA: Diagnosis not present

## 2014-08-05 DIAGNOSIS — G309 Alzheimer's disease, unspecified: Secondary | ICD-10-CM | POA: Diagnosis not present

## 2014-08-06 DIAGNOSIS — G309 Alzheimer's disease, unspecified: Secondary | ICD-10-CM | POA: Diagnosis not present

## 2014-08-07 DIAGNOSIS — G309 Alzheimer's disease, unspecified: Secondary | ICD-10-CM | POA: Diagnosis not present

## 2014-08-08 DIAGNOSIS — G309 Alzheimer's disease, unspecified: Secondary | ICD-10-CM | POA: Diagnosis not present

## 2014-08-09 DIAGNOSIS — G309 Alzheimer's disease, unspecified: Secondary | ICD-10-CM | POA: Diagnosis not present

## 2014-08-10 DIAGNOSIS — G309 Alzheimer's disease, unspecified: Secondary | ICD-10-CM | POA: Diagnosis not present

## 2014-08-11 DIAGNOSIS — G309 Alzheimer's disease, unspecified: Secondary | ICD-10-CM | POA: Diagnosis not present

## 2014-08-12 DIAGNOSIS — G309 Alzheimer's disease, unspecified: Secondary | ICD-10-CM | POA: Diagnosis not present

## 2014-08-13 DIAGNOSIS — G309 Alzheimer's disease, unspecified: Secondary | ICD-10-CM | POA: Diagnosis not present

## 2014-08-14 DIAGNOSIS — G309 Alzheimer's disease, unspecified: Secondary | ICD-10-CM | POA: Diagnosis not present

## 2014-08-15 DIAGNOSIS — G309 Alzheimer's disease, unspecified: Secondary | ICD-10-CM | POA: Diagnosis not present

## 2014-08-16 DIAGNOSIS — G309 Alzheimer's disease, unspecified: Secondary | ICD-10-CM | POA: Diagnosis not present

## 2014-08-17 DIAGNOSIS — G309 Alzheimer's disease, unspecified: Secondary | ICD-10-CM | POA: Diagnosis not present

## 2014-08-18 DIAGNOSIS — G309 Alzheimer's disease, unspecified: Secondary | ICD-10-CM | POA: Diagnosis not present

## 2014-08-19 DIAGNOSIS — G309 Alzheimer's disease, unspecified: Secondary | ICD-10-CM | POA: Diagnosis not present

## 2014-08-20 DIAGNOSIS — G309 Alzheimer's disease, unspecified: Secondary | ICD-10-CM | POA: Diagnosis not present

## 2014-08-21 DIAGNOSIS — G309 Alzheimer's disease, unspecified: Secondary | ICD-10-CM | POA: Diagnosis not present

## 2014-08-22 DIAGNOSIS — G309 Alzheimer's disease, unspecified: Secondary | ICD-10-CM | POA: Diagnosis not present

## 2014-08-23 DIAGNOSIS — G309 Alzheimer's disease, unspecified: Secondary | ICD-10-CM | POA: Diagnosis not present

## 2014-08-24 DIAGNOSIS — G309 Alzheimer's disease, unspecified: Secondary | ICD-10-CM | POA: Diagnosis not present

## 2014-08-25 DIAGNOSIS — G309 Alzheimer's disease, unspecified: Secondary | ICD-10-CM | POA: Diagnosis not present

## 2014-08-26 DIAGNOSIS — G309 Alzheimer's disease, unspecified: Secondary | ICD-10-CM | POA: Diagnosis not present

## 2014-08-27 DIAGNOSIS — G309 Alzheimer's disease, unspecified: Secondary | ICD-10-CM | POA: Diagnosis not present

## 2014-08-28 DIAGNOSIS — G309 Alzheimer's disease, unspecified: Secondary | ICD-10-CM | POA: Diagnosis not present

## 2014-08-29 DIAGNOSIS — G309 Alzheimer's disease, unspecified: Secondary | ICD-10-CM | POA: Diagnosis not present

## 2014-08-30 DIAGNOSIS — G309 Alzheimer's disease, unspecified: Secondary | ICD-10-CM | POA: Diagnosis not present

## 2014-08-31 DIAGNOSIS — G309 Alzheimer's disease, unspecified: Secondary | ICD-10-CM | POA: Diagnosis not present

## 2014-09-01 DIAGNOSIS — G309 Alzheimer's disease, unspecified: Secondary | ICD-10-CM | POA: Diagnosis not present

## 2014-09-02 DIAGNOSIS — G309 Alzheimer's disease, unspecified: Secondary | ICD-10-CM | POA: Diagnosis not present

## 2014-09-03 DIAGNOSIS — G309 Alzheimer's disease, unspecified: Secondary | ICD-10-CM | POA: Diagnosis not present

## 2014-09-04 DIAGNOSIS — G309 Alzheimer's disease, unspecified: Secondary | ICD-10-CM | POA: Diagnosis not present

## 2014-09-05 DIAGNOSIS — F0390 Unspecified dementia without behavioral disturbance: Secondary | ICD-10-CM | POA: Diagnosis not present

## 2014-09-05 DIAGNOSIS — K210 Gastro-esophageal reflux disease with esophagitis: Secondary | ICD-10-CM | POA: Diagnosis not present

## 2014-09-05 DIAGNOSIS — G309 Alzheimer's disease, unspecified: Secondary | ICD-10-CM | POA: Diagnosis not present

## 2014-09-05 DIAGNOSIS — R51 Headache: Secondary | ICD-10-CM | POA: Diagnosis not present

## 2014-09-05 DIAGNOSIS — I10 Essential (primary) hypertension: Secondary | ICD-10-CM | POA: Diagnosis not present

## 2014-09-06 DIAGNOSIS — M79675 Pain in left toe(s): Secondary | ICD-10-CM | POA: Diagnosis not present

## 2014-09-06 DIAGNOSIS — M79674 Pain in right toe(s): Secondary | ICD-10-CM | POA: Diagnosis not present

## 2014-09-06 DIAGNOSIS — G309 Alzheimer's disease, unspecified: Secondary | ICD-10-CM | POA: Diagnosis not present

## 2014-09-06 DIAGNOSIS — B351 Tinea unguium: Secondary | ICD-10-CM | POA: Diagnosis not present

## 2014-09-07 DIAGNOSIS — G309 Alzheimer's disease, unspecified: Secondary | ICD-10-CM | POA: Diagnosis not present

## 2014-09-08 DIAGNOSIS — G309 Alzheimer's disease, unspecified: Secondary | ICD-10-CM | POA: Diagnosis not present

## 2014-09-09 DIAGNOSIS — G309 Alzheimer's disease, unspecified: Secondary | ICD-10-CM | POA: Diagnosis not present

## 2014-09-10 DIAGNOSIS — G309 Alzheimer's disease, unspecified: Secondary | ICD-10-CM | POA: Diagnosis not present

## 2014-09-11 DIAGNOSIS — G309 Alzheimer's disease, unspecified: Secondary | ICD-10-CM | POA: Diagnosis not present

## 2014-09-12 DIAGNOSIS — G309 Alzheimer's disease, unspecified: Secondary | ICD-10-CM | POA: Diagnosis not present

## 2014-09-13 DIAGNOSIS — G309 Alzheimer's disease, unspecified: Secondary | ICD-10-CM | POA: Diagnosis not present

## 2014-09-14 DIAGNOSIS — G309 Alzheimer's disease, unspecified: Secondary | ICD-10-CM | POA: Diagnosis not present

## 2014-09-15 DIAGNOSIS — G309 Alzheimer's disease, unspecified: Secondary | ICD-10-CM | POA: Diagnosis not present

## 2014-09-16 DIAGNOSIS — G309 Alzheimer's disease, unspecified: Secondary | ICD-10-CM | POA: Diagnosis not present

## 2014-09-17 DIAGNOSIS — G309 Alzheimer's disease, unspecified: Secondary | ICD-10-CM | POA: Diagnosis not present

## 2014-09-18 DIAGNOSIS — G309 Alzheimer's disease, unspecified: Secondary | ICD-10-CM | POA: Diagnosis not present

## 2014-09-19 DIAGNOSIS — G309 Alzheimer's disease, unspecified: Secondary | ICD-10-CM | POA: Diagnosis not present

## 2014-09-20 DIAGNOSIS — G309 Alzheimer's disease, unspecified: Secondary | ICD-10-CM | POA: Diagnosis not present

## 2014-09-21 DIAGNOSIS — G309 Alzheimer's disease, unspecified: Secondary | ICD-10-CM | POA: Diagnosis not present

## 2014-09-22 DIAGNOSIS — G309 Alzheimer's disease, unspecified: Secondary | ICD-10-CM | POA: Diagnosis not present

## 2014-09-23 DIAGNOSIS — G309 Alzheimer's disease, unspecified: Secondary | ICD-10-CM | POA: Diagnosis not present

## 2014-09-24 DIAGNOSIS — G309 Alzheimer's disease, unspecified: Secondary | ICD-10-CM | POA: Diagnosis not present

## 2014-09-25 DIAGNOSIS — G309 Alzheimer's disease, unspecified: Secondary | ICD-10-CM | POA: Diagnosis not present

## 2014-09-26 DIAGNOSIS — G309 Alzheimer's disease, unspecified: Secondary | ICD-10-CM | POA: Diagnosis not present

## 2014-09-27 DIAGNOSIS — E1165 Type 2 diabetes mellitus with hyperglycemia: Secondary | ICD-10-CM | POA: Diagnosis not present

## 2014-09-27 DIAGNOSIS — I10 Essential (primary) hypertension: Secondary | ICD-10-CM | POA: Diagnosis not present

## 2014-09-27 DIAGNOSIS — G309 Alzheimer's disease, unspecified: Secondary | ICD-10-CM | POA: Diagnosis not present

## 2014-09-27 DIAGNOSIS — E119 Type 2 diabetes mellitus without complications: Secondary | ICD-10-CM | POA: Diagnosis not present

## 2014-09-28 DIAGNOSIS — G309 Alzheimer's disease, unspecified: Secondary | ICD-10-CM | POA: Diagnosis not present

## 2014-09-29 DIAGNOSIS — G309 Alzheimer's disease, unspecified: Secondary | ICD-10-CM | POA: Diagnosis not present

## 2014-09-30 DIAGNOSIS — D649 Anemia, unspecified: Secondary | ICD-10-CM | POA: Diagnosis not present

## 2014-09-30 DIAGNOSIS — G309 Alzheimer's disease, unspecified: Secondary | ICD-10-CM | POA: Diagnosis not present

## 2014-09-30 DIAGNOSIS — Z79899 Other long term (current) drug therapy: Secondary | ICD-10-CM | POA: Diagnosis not present

## 2014-09-30 DIAGNOSIS — E119 Type 2 diabetes mellitus without complications: Secondary | ICD-10-CM | POA: Diagnosis not present

## 2014-10-01 DIAGNOSIS — G309 Alzheimer's disease, unspecified: Secondary | ICD-10-CM | POA: Diagnosis not present

## 2014-10-02 DIAGNOSIS — G309 Alzheimer's disease, unspecified: Secondary | ICD-10-CM | POA: Diagnosis not present

## 2014-10-03 DIAGNOSIS — G309 Alzheimer's disease, unspecified: Secondary | ICD-10-CM | POA: Diagnosis not present

## 2014-10-04 DIAGNOSIS — G309 Alzheimer's disease, unspecified: Secondary | ICD-10-CM | POA: Diagnosis not present

## 2014-10-05 DIAGNOSIS — G309 Alzheimer's disease, unspecified: Secondary | ICD-10-CM | POA: Diagnosis not present

## 2014-10-06 DIAGNOSIS — G309 Alzheimer's disease, unspecified: Secondary | ICD-10-CM | POA: Diagnosis not present

## 2014-10-07 DIAGNOSIS — G309 Alzheimer's disease, unspecified: Secondary | ICD-10-CM | POA: Diagnosis not present

## 2014-10-08 DIAGNOSIS — G309 Alzheimer's disease, unspecified: Secondary | ICD-10-CM | POA: Diagnosis not present

## 2014-10-09 DIAGNOSIS — G309 Alzheimer's disease, unspecified: Secondary | ICD-10-CM | POA: Diagnosis not present

## 2014-10-10 DIAGNOSIS — G309 Alzheimer's disease, unspecified: Secondary | ICD-10-CM | POA: Diagnosis not present

## 2014-10-11 DIAGNOSIS — G309 Alzheimer's disease, unspecified: Secondary | ICD-10-CM | POA: Diagnosis not present

## 2014-10-12 DIAGNOSIS — G309 Alzheimer's disease, unspecified: Secondary | ICD-10-CM | POA: Diagnosis not present

## 2014-10-13 DIAGNOSIS — G309 Alzheimer's disease, unspecified: Secondary | ICD-10-CM | POA: Diagnosis not present

## 2014-10-14 DIAGNOSIS — G309 Alzheimer's disease, unspecified: Secondary | ICD-10-CM | POA: Diagnosis not present

## 2014-10-15 DIAGNOSIS — G309 Alzheimer's disease, unspecified: Secondary | ICD-10-CM | POA: Diagnosis not present

## 2014-10-16 DIAGNOSIS — G309 Alzheimer's disease, unspecified: Secondary | ICD-10-CM | POA: Diagnosis not present

## 2014-10-17 DIAGNOSIS — G309 Alzheimer's disease, unspecified: Secondary | ICD-10-CM | POA: Diagnosis not present

## 2014-10-17 DIAGNOSIS — K219 Gastro-esophageal reflux disease without esophagitis: Secondary | ICD-10-CM | POA: Diagnosis not present

## 2014-10-17 DIAGNOSIS — E785 Hyperlipidemia, unspecified: Secondary | ICD-10-CM | POA: Diagnosis not present

## 2014-10-17 DIAGNOSIS — I10 Essential (primary) hypertension: Secondary | ICD-10-CM | POA: Diagnosis not present

## 2014-10-17 DIAGNOSIS — D649 Anemia, unspecified: Secondary | ICD-10-CM | POA: Diagnosis not present

## 2014-10-18 DIAGNOSIS — G309 Alzheimer's disease, unspecified: Secondary | ICD-10-CM | POA: Diagnosis not present

## 2014-10-19 DIAGNOSIS — G309 Alzheimer's disease, unspecified: Secondary | ICD-10-CM | POA: Diagnosis not present

## 2014-10-20 DIAGNOSIS — G309 Alzheimer's disease, unspecified: Secondary | ICD-10-CM | POA: Diagnosis not present

## 2014-10-21 DIAGNOSIS — G309 Alzheimer's disease, unspecified: Secondary | ICD-10-CM | POA: Diagnosis not present

## 2014-10-22 DIAGNOSIS — G309 Alzheimer's disease, unspecified: Secondary | ICD-10-CM | POA: Diagnosis not present

## 2014-10-23 DIAGNOSIS — G309 Alzheimer's disease, unspecified: Secondary | ICD-10-CM | POA: Diagnosis not present

## 2014-10-24 DIAGNOSIS — G309 Alzheimer's disease, unspecified: Secondary | ICD-10-CM | POA: Diagnosis not present

## 2014-10-25 DIAGNOSIS — G309 Alzheimer's disease, unspecified: Secondary | ICD-10-CM | POA: Diagnosis not present

## 2014-10-26 DIAGNOSIS — G309 Alzheimer's disease, unspecified: Secondary | ICD-10-CM | POA: Diagnosis not present

## 2014-10-27 DIAGNOSIS — G309 Alzheimer's disease, unspecified: Secondary | ICD-10-CM | POA: Diagnosis not present

## 2014-10-28 DIAGNOSIS — G309 Alzheimer's disease, unspecified: Secondary | ICD-10-CM | POA: Diagnosis not present

## 2014-10-29 DIAGNOSIS — G309 Alzheimer's disease, unspecified: Secondary | ICD-10-CM | POA: Diagnosis not present

## 2014-10-30 DIAGNOSIS — G309 Alzheimer's disease, unspecified: Secondary | ICD-10-CM | POA: Diagnosis not present

## 2014-10-31 DIAGNOSIS — G309 Alzheimer's disease, unspecified: Secondary | ICD-10-CM | POA: Diagnosis not present

## 2014-11-01 DIAGNOSIS — G309 Alzheimer's disease, unspecified: Secondary | ICD-10-CM | POA: Diagnosis not present

## 2014-11-02 DIAGNOSIS — G309 Alzheimer's disease, unspecified: Secondary | ICD-10-CM | POA: Diagnosis not present

## 2014-11-03 DIAGNOSIS — G309 Alzheimer's disease, unspecified: Secondary | ICD-10-CM | POA: Diagnosis not present

## 2014-11-04 DIAGNOSIS — G309 Alzheimer's disease, unspecified: Secondary | ICD-10-CM | POA: Diagnosis not present

## 2014-11-05 DIAGNOSIS — G309 Alzheimer's disease, unspecified: Secondary | ICD-10-CM | POA: Diagnosis not present

## 2014-11-06 DIAGNOSIS — G309 Alzheimer's disease, unspecified: Secondary | ICD-10-CM | POA: Diagnosis not present

## 2014-11-07 DIAGNOSIS — G309 Alzheimer's disease, unspecified: Secondary | ICD-10-CM | POA: Diagnosis not present

## 2014-11-08 DIAGNOSIS — G309 Alzheimer's disease, unspecified: Secondary | ICD-10-CM | POA: Diagnosis not present

## 2014-11-09 DIAGNOSIS — G309 Alzheimer's disease, unspecified: Secondary | ICD-10-CM | POA: Diagnosis not present

## 2014-11-10 DIAGNOSIS — G309 Alzheimer's disease, unspecified: Secondary | ICD-10-CM | POA: Diagnosis not present

## 2014-11-11 DIAGNOSIS — G309 Alzheimer's disease, unspecified: Secondary | ICD-10-CM | POA: Diagnosis not present

## 2014-11-12 DIAGNOSIS — G309 Alzheimer's disease, unspecified: Secondary | ICD-10-CM | POA: Diagnosis not present

## 2014-11-13 DIAGNOSIS — G309 Alzheimer's disease, unspecified: Secondary | ICD-10-CM | POA: Diagnosis not present

## 2014-11-14 DIAGNOSIS — G309 Alzheimer's disease, unspecified: Secondary | ICD-10-CM | POA: Diagnosis not present

## 2014-11-14 DIAGNOSIS — R451 Restlessness and agitation: Secondary | ICD-10-CM | POA: Diagnosis not present

## 2014-11-14 DIAGNOSIS — F0391 Unspecified dementia with behavioral disturbance: Secondary | ICD-10-CM | POA: Diagnosis not present

## 2014-11-14 DIAGNOSIS — F419 Anxiety disorder, unspecified: Secondary | ICD-10-CM | POA: Diagnosis not present

## 2014-11-15 DIAGNOSIS — G309 Alzheimer's disease, unspecified: Secondary | ICD-10-CM | POA: Diagnosis not present

## 2014-11-16 DIAGNOSIS — G309 Alzheimer's disease, unspecified: Secondary | ICD-10-CM | POA: Diagnosis not present

## 2014-11-17 DIAGNOSIS — G309 Alzheimer's disease, unspecified: Secondary | ICD-10-CM | POA: Diagnosis not present

## 2014-11-18 DIAGNOSIS — G309 Alzheimer's disease, unspecified: Secondary | ICD-10-CM | POA: Diagnosis not present

## 2014-11-19 DIAGNOSIS — G309 Alzheimer's disease, unspecified: Secondary | ICD-10-CM | POA: Diagnosis not present

## 2014-11-20 DIAGNOSIS — G309 Alzheimer's disease, unspecified: Secondary | ICD-10-CM | POA: Diagnosis not present

## 2014-11-21 DIAGNOSIS — G309 Alzheimer's disease, unspecified: Secondary | ICD-10-CM | POA: Diagnosis not present

## 2014-11-22 DIAGNOSIS — G309 Alzheimer's disease, unspecified: Secondary | ICD-10-CM | POA: Diagnosis not present

## 2014-11-23 DIAGNOSIS — G309 Alzheimer's disease, unspecified: Secondary | ICD-10-CM | POA: Diagnosis not present

## 2014-11-24 DIAGNOSIS — G309 Alzheimer's disease, unspecified: Secondary | ICD-10-CM | POA: Diagnosis not present

## 2014-11-25 DIAGNOSIS — G309 Alzheimer's disease, unspecified: Secondary | ICD-10-CM | POA: Diagnosis not present

## 2014-11-26 DIAGNOSIS — G309 Alzheimer's disease, unspecified: Secondary | ICD-10-CM | POA: Diagnosis not present

## 2014-11-27 DIAGNOSIS — G309 Alzheimer's disease, unspecified: Secondary | ICD-10-CM | POA: Diagnosis not present

## 2014-11-28 DIAGNOSIS — G309 Alzheimer's disease, unspecified: Secondary | ICD-10-CM | POA: Diagnosis not present

## 2014-11-29 DIAGNOSIS — G309 Alzheimer's disease, unspecified: Secondary | ICD-10-CM | POA: Diagnosis not present

## 2014-11-30 DIAGNOSIS — G309 Alzheimer's disease, unspecified: Secondary | ICD-10-CM | POA: Diagnosis not present

## 2014-12-01 DIAGNOSIS — G309 Alzheimer's disease, unspecified: Secondary | ICD-10-CM | POA: Diagnosis not present

## 2014-12-02 DIAGNOSIS — G309 Alzheimer's disease, unspecified: Secondary | ICD-10-CM | POA: Diagnosis not present

## 2014-12-03 DIAGNOSIS — G309 Alzheimer's disease, unspecified: Secondary | ICD-10-CM | POA: Diagnosis not present

## 2014-12-04 DIAGNOSIS — G309 Alzheimer's disease, unspecified: Secondary | ICD-10-CM | POA: Diagnosis not present

## 2014-12-05 DIAGNOSIS — G309 Alzheimer's disease, unspecified: Secondary | ICD-10-CM | POA: Diagnosis not present

## 2014-12-05 DIAGNOSIS — R32 Unspecified urinary incontinence: Secondary | ICD-10-CM | POA: Diagnosis not present

## 2014-12-06 DIAGNOSIS — G309 Alzheimer's disease, unspecified: Secondary | ICD-10-CM | POA: Diagnosis not present

## 2014-12-07 DIAGNOSIS — G309 Alzheimer's disease, unspecified: Secondary | ICD-10-CM | POA: Diagnosis not present

## 2014-12-08 DIAGNOSIS — G309 Alzheimer's disease, unspecified: Secondary | ICD-10-CM | POA: Diagnosis not present

## 2014-12-09 DIAGNOSIS — G309 Alzheimer's disease, unspecified: Secondary | ICD-10-CM | POA: Diagnosis not present

## 2014-12-10 DIAGNOSIS — G309 Alzheimer's disease, unspecified: Secondary | ICD-10-CM | POA: Diagnosis not present

## 2014-12-11 DIAGNOSIS — G309 Alzheimer's disease, unspecified: Secondary | ICD-10-CM | POA: Diagnosis not present

## 2014-12-12 DIAGNOSIS — G309 Alzheimer's disease, unspecified: Secondary | ICD-10-CM | POA: Diagnosis not present

## 2014-12-13 DIAGNOSIS — G309 Alzheimer's disease, unspecified: Secondary | ICD-10-CM | POA: Diagnosis not present

## 2014-12-14 DIAGNOSIS — G309 Alzheimer's disease, unspecified: Secondary | ICD-10-CM | POA: Diagnosis not present

## 2014-12-15 DIAGNOSIS — G309 Alzheimer's disease, unspecified: Secondary | ICD-10-CM | POA: Diagnosis not present

## 2014-12-16 DIAGNOSIS — M79671 Pain in right foot: Secondary | ICD-10-CM | POA: Diagnosis not present

## 2014-12-16 DIAGNOSIS — F419 Anxiety disorder, unspecified: Secondary | ICD-10-CM | POA: Diagnosis not present

## 2014-12-16 DIAGNOSIS — M79672 Pain in left foot: Secondary | ICD-10-CM | POA: Diagnosis not present

## 2014-12-16 DIAGNOSIS — B351 Tinea unguium: Secondary | ICD-10-CM | POA: Diagnosis not present

## 2014-12-16 DIAGNOSIS — F0391 Unspecified dementia with behavioral disturbance: Secondary | ICD-10-CM | POA: Diagnosis not present

## 2014-12-16 DIAGNOSIS — R451 Restlessness and agitation: Secondary | ICD-10-CM | POA: Diagnosis not present

## 2014-12-16 DIAGNOSIS — G309 Alzheimer's disease, unspecified: Secondary | ICD-10-CM | POA: Diagnosis not present

## 2014-12-17 DIAGNOSIS — G309 Alzheimer's disease, unspecified: Secondary | ICD-10-CM | POA: Diagnosis not present

## 2014-12-18 DIAGNOSIS — G309 Alzheimer's disease, unspecified: Secondary | ICD-10-CM | POA: Diagnosis not present

## 2014-12-19 DIAGNOSIS — G309 Alzheimer's disease, unspecified: Secondary | ICD-10-CM | POA: Diagnosis not present

## 2014-12-20 DIAGNOSIS — G309 Alzheimer's disease, unspecified: Secondary | ICD-10-CM | POA: Diagnosis not present

## 2014-12-21 DIAGNOSIS — G309 Alzheimer's disease, unspecified: Secondary | ICD-10-CM | POA: Diagnosis not present

## 2014-12-22 DIAGNOSIS — G309 Alzheimer's disease, unspecified: Secondary | ICD-10-CM | POA: Diagnosis not present

## 2014-12-23 DIAGNOSIS — G309 Alzheimer's disease, unspecified: Secondary | ICD-10-CM | POA: Diagnosis not present

## 2014-12-24 DIAGNOSIS — G309 Alzheimer's disease, unspecified: Secondary | ICD-10-CM | POA: Diagnosis not present

## 2014-12-25 DIAGNOSIS — G309 Alzheimer's disease, unspecified: Secondary | ICD-10-CM | POA: Diagnosis not present

## 2014-12-26 DIAGNOSIS — G309 Alzheimer's disease, unspecified: Secondary | ICD-10-CM | POA: Diagnosis not present

## 2014-12-27 DIAGNOSIS — G309 Alzheimer's disease, unspecified: Secondary | ICD-10-CM | POA: Diagnosis not present

## 2014-12-28 DIAGNOSIS — G309 Alzheimer's disease, unspecified: Secondary | ICD-10-CM | POA: Diagnosis not present

## 2014-12-29 DIAGNOSIS — G309 Alzheimer's disease, unspecified: Secondary | ICD-10-CM | POA: Diagnosis not present

## 2014-12-30 DIAGNOSIS — G309 Alzheimer's disease, unspecified: Secondary | ICD-10-CM | POA: Diagnosis not present

## 2014-12-31 DIAGNOSIS — G309 Alzheimer's disease, unspecified: Secondary | ICD-10-CM | POA: Diagnosis not present

## 2015-01-01 DIAGNOSIS — G309 Alzheimer's disease, unspecified: Secondary | ICD-10-CM | POA: Diagnosis not present

## 2015-01-02 DIAGNOSIS — G309 Alzheimer's disease, unspecified: Secondary | ICD-10-CM | POA: Diagnosis not present

## 2015-01-03 DIAGNOSIS — G309 Alzheimer's disease, unspecified: Secondary | ICD-10-CM | POA: Diagnosis not present

## 2015-01-04 DIAGNOSIS — G309 Alzheimer's disease, unspecified: Secondary | ICD-10-CM | POA: Diagnosis not present

## 2015-01-05 DIAGNOSIS — G309 Alzheimer's disease, unspecified: Secondary | ICD-10-CM | POA: Diagnosis not present

## 2015-01-06 DIAGNOSIS — G309 Alzheimer's disease, unspecified: Secondary | ICD-10-CM | POA: Diagnosis not present

## 2015-01-07 DIAGNOSIS — G309 Alzheimer's disease, unspecified: Secondary | ICD-10-CM | POA: Diagnosis not present

## 2015-01-08 DIAGNOSIS — G309 Alzheimer's disease, unspecified: Secondary | ICD-10-CM | POA: Diagnosis not present

## 2015-01-09 DIAGNOSIS — G309 Alzheimer's disease, unspecified: Secondary | ICD-10-CM | POA: Diagnosis not present

## 2015-01-10 DIAGNOSIS — G309 Alzheimer's disease, unspecified: Secondary | ICD-10-CM | POA: Diagnosis not present

## 2015-01-11 DIAGNOSIS — G309 Alzheimer's disease, unspecified: Secondary | ICD-10-CM | POA: Diagnosis not present

## 2015-01-12 DIAGNOSIS — G309 Alzheimer's disease, unspecified: Secondary | ICD-10-CM | POA: Diagnosis not present

## 2015-01-13 DIAGNOSIS — R451 Restlessness and agitation: Secondary | ICD-10-CM | POA: Diagnosis not present

## 2015-01-13 DIAGNOSIS — G309 Alzheimer's disease, unspecified: Secondary | ICD-10-CM | POA: Diagnosis not present

## 2015-01-13 DIAGNOSIS — F0391 Unspecified dementia with behavioral disturbance: Secondary | ICD-10-CM | POA: Diagnosis not present

## 2015-01-13 DIAGNOSIS — F419 Anxiety disorder, unspecified: Secondary | ICD-10-CM | POA: Diagnosis not present

## 2015-01-14 DIAGNOSIS — G309 Alzheimer's disease, unspecified: Secondary | ICD-10-CM | POA: Diagnosis not present

## 2015-01-15 DIAGNOSIS — G309 Alzheimer's disease, unspecified: Secondary | ICD-10-CM | POA: Diagnosis not present

## 2015-01-16 DIAGNOSIS — G309 Alzheimer's disease, unspecified: Secondary | ICD-10-CM | POA: Diagnosis not present

## 2015-01-17 DIAGNOSIS — G309 Alzheimer's disease, unspecified: Secondary | ICD-10-CM | POA: Diagnosis not present

## 2015-01-18 DIAGNOSIS — G309 Alzheimer's disease, unspecified: Secondary | ICD-10-CM | POA: Diagnosis not present

## 2015-01-19 DIAGNOSIS — G309 Alzheimer's disease, unspecified: Secondary | ICD-10-CM | POA: Diagnosis not present

## 2015-01-20 DIAGNOSIS — G309 Alzheimer's disease, unspecified: Secondary | ICD-10-CM | POA: Diagnosis not present

## 2015-01-21 DIAGNOSIS — G309 Alzheimer's disease, unspecified: Secondary | ICD-10-CM | POA: Diagnosis not present

## 2015-01-22 DIAGNOSIS — G309 Alzheimer's disease, unspecified: Secondary | ICD-10-CM | POA: Diagnosis not present

## 2015-01-23 DIAGNOSIS — G309 Alzheimer's disease, unspecified: Secondary | ICD-10-CM | POA: Diagnosis not present

## 2015-01-24 DIAGNOSIS — G309 Alzheimer's disease, unspecified: Secondary | ICD-10-CM | POA: Diagnosis not present

## 2015-01-25 DIAGNOSIS — G309 Alzheimer's disease, unspecified: Secondary | ICD-10-CM | POA: Diagnosis not present

## 2015-01-26 DIAGNOSIS — G309 Alzheimer's disease, unspecified: Secondary | ICD-10-CM | POA: Diagnosis not present

## 2015-01-27 ENCOUNTER — Other Ambulatory Visit: Payer: Self-pay

## 2015-01-27 DIAGNOSIS — M6281 Muscle weakness (generalized): Secondary | ICD-10-CM | POA: Diagnosis not present

## 2015-01-27 DIAGNOSIS — I129 Hypertensive chronic kidney disease with stage 1 through stage 4 chronic kidney disease, or unspecified chronic kidney disease: Secondary | ICD-10-CM | POA: Diagnosis not present

## 2015-01-27 DIAGNOSIS — Z9181 History of falling: Secondary | ICD-10-CM | POA: Diagnosis not present

## 2015-01-27 DIAGNOSIS — F0280 Dementia in other diseases classified elsewhere without behavioral disturbance: Secondary | ICD-10-CM | POA: Diagnosis not present

## 2015-01-27 DIAGNOSIS — G309 Alzheimer's disease, unspecified: Secondary | ICD-10-CM | POA: Diagnosis not present

## 2015-01-27 DIAGNOSIS — F329 Major depressive disorder, single episode, unspecified: Secondary | ICD-10-CM | POA: Diagnosis not present

## 2015-01-27 DIAGNOSIS — N183 Chronic kidney disease, stage 3 (moderate): Secondary | ICD-10-CM | POA: Diagnosis not present

## 2015-01-27 NOTE — Patient Outreach (Signed)
[facility]) Care Management  01/27/2015  Bella Kennedyolly K Ainley [birth year] [id number]  Telephone Screen  Outreach call #1.  Patient not reached.   RN CM Left HIPAA compliant voice message with name and call back #.   Plan: RN CM will schedule for next outreach call within one week.   Donato Schultzrystal Jared Whorley, RN, BSN, [facility], CCM  [facility] Care Management Care Management Coordinator [phone] Direct [phone] Cell [phone] Office [phone] Fax

## 2015-01-28 DIAGNOSIS — G309 Alzheimer's disease, unspecified: Secondary | ICD-10-CM | POA: Diagnosis not present

## 2015-01-29 ENCOUNTER — Other Ambulatory Visit: Payer: Self-pay

## 2015-01-29 DIAGNOSIS — F0280 Dementia in other diseases classified elsewhere without behavioral disturbance: Secondary | ICD-10-CM | POA: Diagnosis not present

## 2015-01-29 DIAGNOSIS — N183 Chronic kidney disease, stage 3 (moderate): Secondary | ICD-10-CM | POA: Diagnosis not present

## 2015-01-29 DIAGNOSIS — Z9181 History of falling: Secondary | ICD-10-CM | POA: Diagnosis not present

## 2015-01-29 DIAGNOSIS — I129 Hypertensive chronic kidney disease with stage 1 through stage 4 chronic kidney disease, or unspecified chronic kidney disease: Secondary | ICD-10-CM | POA: Diagnosis not present

## 2015-01-29 DIAGNOSIS — G309 Alzheimer's disease, unspecified: Secondary | ICD-10-CM | POA: Diagnosis not present

## 2015-01-29 DIAGNOSIS — M6281 Muscle weakness (generalized): Secondary | ICD-10-CM | POA: Diagnosis not present

## 2015-01-29 DIAGNOSIS — F329 Major depressive disorder, single episode, unspecified: Secondary | ICD-10-CM | POA: Diagnosis not present

## 2015-01-29 NOTE — Patient Outreach (Signed)
[facility]) Care Management  01/29/2015  Laura Savage February 21, 1929 [id number]  Telephone Screen  Referral Date:  01/20/2015 Referral Source:  [facility] H[facility] tier 4 Referral Issue:  DM with 3 ED visits and 1 admission  Outreach call #2. Patient not reached.  RN CM Left HIPAA compliant voice message with name and call back #.   Additional call to 2nd home #; disconnected.   Plan: RN CM will schedule for next outreach call within one week.   Donato Schultzrystal Ryszard Socarras, RN, BSN, [facility], CCM  [facility] Care [facility] Care Management Coordinator [phone] Direct [phone] Cell [phone] Office [phone] Fax

## 2015-01-30 DIAGNOSIS — F329 Major depressive disorder, single episode, unspecified: Secondary | ICD-10-CM | POA: Diagnosis not present

## 2015-01-30 DIAGNOSIS — E784 Other hyperlipidemia: Secondary | ICD-10-CM | POA: Diagnosis not present

## 2015-01-30 DIAGNOSIS — F0280 Dementia in other diseases classified elsewhere without behavioral disturbance: Secondary | ICD-10-CM | POA: Diagnosis not present

## 2015-01-30 DIAGNOSIS — F0390 Unspecified dementia without behavioral disturbance: Secondary | ICD-10-CM | POA: Diagnosis not present

## 2015-01-30 DIAGNOSIS — Z9181 History of falling: Secondary | ICD-10-CM | POA: Diagnosis not present

## 2015-01-30 DIAGNOSIS — G309 Alzheimer's disease, unspecified: Secondary | ICD-10-CM | POA: Diagnosis not present

## 2015-01-30 DIAGNOSIS — I10 Essential (primary) hypertension: Secondary | ICD-10-CM | POA: Diagnosis not present

## 2015-01-30 DIAGNOSIS — M6281 Muscle weakness (generalized): Secondary | ICD-10-CM | POA: Diagnosis not present

## 2015-01-30 DIAGNOSIS — I129 Hypertensive chronic kidney disease with stage 1 through stage 4 chronic kidney disease, or unspecified chronic kidney disease: Secondary | ICD-10-CM | POA: Diagnosis not present

## 2015-01-30 DIAGNOSIS — N183 Chronic kidney disease, stage 3 (moderate): Secondary | ICD-10-CM | POA: Diagnosis not present

## 2015-01-30 DIAGNOSIS — K219 Gastro-esophageal reflux disease without esophagitis: Secondary | ICD-10-CM | POA: Diagnosis not present

## 2015-01-31 ENCOUNTER — Other Ambulatory Visit: Payer: Self-pay

## 2015-01-31 DIAGNOSIS — G309 Alzheimer's disease, unspecified: Secondary | ICD-10-CM | POA: Diagnosis not present

## 2015-01-31 NOTE — Patient Outreach (Signed)
[facility]) Care Management  01/31/2015  Laura Savage 07/22/1929 [id number]   Telephone Screen  Referral Date: 01/20/2015 Referral Source: [facility] H[facility] tier 4 Referral Issue: DM with 3 ED visits and 1 admission  Outreach call #3. [phone]  Patient not reached.  RN CM Left HIPAA compliant voice message with name and call back #.   Additional call to 2nd home # [phone]; disconnected.   Plan: Unsuccessful outreach letter sent to patient.   RN CM will review for any response in 2 weeks.   Donato Schultzrystal Lynnmarie Lovett, RN, BSN, [facility], CCM  [facility] Care Management Care Management Coordinator [phone] Direct [phone] Cell [phone] Office [phone] Fax

## 2015-02-01 DIAGNOSIS — G309 Alzheimer's disease, unspecified: Secondary | ICD-10-CM | POA: Diagnosis not present

## 2015-02-02 DIAGNOSIS — G309 Alzheimer's disease, unspecified: Secondary | ICD-10-CM | POA: Diagnosis not present

## 2015-02-03 DIAGNOSIS — F329 Major depressive disorder, single episode, unspecified: Secondary | ICD-10-CM | POA: Diagnosis not present

## 2015-02-03 DIAGNOSIS — R32 Unspecified urinary incontinence: Secondary | ICD-10-CM | POA: Diagnosis not present

## 2015-02-03 DIAGNOSIS — I129 Hypertensive chronic kidney disease with stage 1 through stage 4 chronic kidney disease, or unspecified chronic kidney disease: Secondary | ICD-10-CM | POA: Diagnosis not present

## 2015-02-03 DIAGNOSIS — G309 Alzheimer's disease, unspecified: Secondary | ICD-10-CM | POA: Diagnosis not present

## 2015-02-03 DIAGNOSIS — Z9181 History of falling: Secondary | ICD-10-CM | POA: Diagnosis not present

## 2015-02-03 DIAGNOSIS — N183 Chronic kidney disease, stage 3 (moderate): Secondary | ICD-10-CM | POA: Diagnosis not present

## 2015-02-03 DIAGNOSIS — M6281 Muscle weakness (generalized): Secondary | ICD-10-CM | POA: Diagnosis not present

## 2015-02-03 DIAGNOSIS — F0280 Dementia in other diseases classified elsewhere without behavioral disturbance: Secondary | ICD-10-CM | POA: Diagnosis not present

## 2015-02-04 DIAGNOSIS — G309 Alzheimer's disease, unspecified: Secondary | ICD-10-CM | POA: Diagnosis not present

## 2015-02-05 DIAGNOSIS — M6281 Muscle weakness (generalized): Secondary | ICD-10-CM | POA: Diagnosis not present

## 2015-02-05 DIAGNOSIS — F0280 Dementia in other diseases classified elsewhere without behavioral disturbance: Secondary | ICD-10-CM | POA: Diagnosis not present

## 2015-02-05 DIAGNOSIS — I129 Hypertensive chronic kidney disease with stage 1 through stage 4 chronic kidney disease, or unspecified chronic kidney disease: Secondary | ICD-10-CM | POA: Diagnosis not present

## 2015-02-05 DIAGNOSIS — F329 Major depressive disorder, single episode, unspecified: Secondary | ICD-10-CM | POA: Diagnosis not present

## 2015-02-05 DIAGNOSIS — Z9181 History of falling: Secondary | ICD-10-CM | POA: Diagnosis not present

## 2015-02-05 DIAGNOSIS — N183 Chronic kidney disease, stage 3 (moderate): Secondary | ICD-10-CM | POA: Diagnosis not present

## 2015-02-05 DIAGNOSIS — G309 Alzheimer's disease, unspecified: Secondary | ICD-10-CM | POA: Diagnosis not present

## 2015-02-06 DIAGNOSIS — D518 Other vitamin B12 deficiency anemias: Secondary | ICD-10-CM | POA: Diagnosis not present

## 2015-02-06 DIAGNOSIS — E1165 Type 2 diabetes mellitus with hyperglycemia: Secondary | ICD-10-CM | POA: Diagnosis not present

## 2015-02-06 DIAGNOSIS — G309 Alzheimer's disease, unspecified: Secondary | ICD-10-CM | POA: Diagnosis not present

## 2015-02-06 DIAGNOSIS — F0280 Dementia in other diseases classified elsewhere without behavioral disturbance: Secondary | ICD-10-CM | POA: Diagnosis not present

## 2015-02-06 DIAGNOSIS — E782 Mixed hyperlipidemia: Secondary | ICD-10-CM | POA: Diagnosis not present

## 2015-02-06 DIAGNOSIS — E038 Other specified hypothyroidism: Secondary | ICD-10-CM | POA: Diagnosis not present

## 2015-02-06 DIAGNOSIS — Z79899 Other long term (current) drug therapy: Secondary | ICD-10-CM | POA: Diagnosis not present

## 2015-02-06 DIAGNOSIS — K219 Gastro-esophageal reflux disease without esophagitis: Secondary | ICD-10-CM | POA: Diagnosis not present

## 2015-02-06 DIAGNOSIS — F330 Major depressive disorder, recurrent, mild: Secondary | ICD-10-CM | POA: Diagnosis not present

## 2015-02-06 DIAGNOSIS — E559 Vitamin D deficiency, unspecified: Secondary | ICD-10-CM | POA: Diagnosis not present

## 2015-02-07 DIAGNOSIS — G309 Alzheimer's disease, unspecified: Secondary | ICD-10-CM | POA: Diagnosis not present

## 2015-02-08 DIAGNOSIS — G309 Alzheimer's disease, unspecified: Secondary | ICD-10-CM | POA: Diagnosis not present

## 2015-02-09 DIAGNOSIS — G309 Alzheimer's disease, unspecified: Secondary | ICD-10-CM | POA: Diagnosis not present

## 2015-02-10 DIAGNOSIS — G309 Alzheimer's disease, unspecified: Secondary | ICD-10-CM | POA: Diagnosis not present

## 2015-02-11 DIAGNOSIS — I129 Hypertensive chronic kidney disease with stage 1 through stage 4 chronic kidney disease, or unspecified chronic kidney disease: Secondary | ICD-10-CM | POA: Diagnosis not present

## 2015-02-11 DIAGNOSIS — Z9181 History of falling: Secondary | ICD-10-CM | POA: Diagnosis not present

## 2015-02-11 DIAGNOSIS — M6281 Muscle weakness (generalized): Secondary | ICD-10-CM | POA: Diagnosis not present

## 2015-02-11 DIAGNOSIS — F329 Major depressive disorder, single episode, unspecified: Secondary | ICD-10-CM | POA: Diagnosis not present

## 2015-02-11 DIAGNOSIS — G309 Alzheimer's disease, unspecified: Secondary | ICD-10-CM | POA: Diagnosis not present

## 2015-02-11 DIAGNOSIS — F0280 Dementia in other diseases classified elsewhere without behavioral disturbance: Secondary | ICD-10-CM | POA: Diagnosis not present

## 2015-02-11 DIAGNOSIS — N183 Chronic kidney disease, stage 3 (moderate): Secondary | ICD-10-CM | POA: Diagnosis not present

## 2015-02-12 DIAGNOSIS — G309 Alzheimer's disease, unspecified: Secondary | ICD-10-CM | POA: Diagnosis not present

## 2015-02-12 DIAGNOSIS — R451 Restlessness and agitation: Secondary | ICD-10-CM | POA: Diagnosis not present

## 2015-02-12 DIAGNOSIS — N183 Chronic kidney disease, stage 3 (moderate): Secondary | ICD-10-CM | POA: Diagnosis not present

## 2015-02-12 DIAGNOSIS — M6281 Muscle weakness (generalized): Secondary | ICD-10-CM | POA: Diagnosis not present

## 2015-02-12 DIAGNOSIS — F329 Major depressive disorder, single episode, unspecified: Secondary | ICD-10-CM | POA: Diagnosis not present

## 2015-02-12 DIAGNOSIS — F0391 Unspecified dementia with behavioral disturbance: Secondary | ICD-10-CM | POA: Diagnosis not present

## 2015-02-12 DIAGNOSIS — F411 Generalized anxiety disorder: Secondary | ICD-10-CM | POA: Diagnosis not present

## 2015-02-12 DIAGNOSIS — Z9181 History of falling: Secondary | ICD-10-CM | POA: Diagnosis not present

## 2015-02-12 DIAGNOSIS — I129 Hypertensive chronic kidney disease with stage 1 through stage 4 chronic kidney disease, or unspecified chronic kidney disease: Secondary | ICD-10-CM | POA: Diagnosis not present

## 2015-02-12 DIAGNOSIS — F0280 Dementia in other diseases classified elsewhere without behavioral disturbance: Secondary | ICD-10-CM | POA: Diagnosis not present

## 2015-02-13 DIAGNOSIS — G309 Alzheimer's disease, unspecified: Secondary | ICD-10-CM | POA: Diagnosis not present

## 2015-02-13 DIAGNOSIS — F0280 Dementia in other diseases classified elsewhere without behavioral disturbance: Secondary | ICD-10-CM | POA: Diagnosis not present

## 2015-02-13 DIAGNOSIS — N183 Chronic kidney disease, stage 3 (moderate): Secondary | ICD-10-CM | POA: Diagnosis not present

## 2015-02-13 DIAGNOSIS — Z9181 History of falling: Secondary | ICD-10-CM | POA: Diagnosis not present

## 2015-02-13 DIAGNOSIS — I129 Hypertensive chronic kidney disease with stage 1 through stage 4 chronic kidney disease, or unspecified chronic kidney disease: Secondary | ICD-10-CM | POA: Diagnosis not present

## 2015-02-13 DIAGNOSIS — M6281 Muscle weakness (generalized): Secondary | ICD-10-CM | POA: Diagnosis not present

## 2015-02-13 DIAGNOSIS — F329 Major depressive disorder, single episode, unspecified: Secondary | ICD-10-CM | POA: Diagnosis not present

## 2015-02-14 ENCOUNTER — Other Ambulatory Visit: Payer: Self-pay

## 2015-02-14 DIAGNOSIS — G309 Alzheimer's disease, unspecified: Secondary | ICD-10-CM | POA: Diagnosis not present

## 2015-02-14 NOTE — Patient Outreach (Signed)
[facility]) Care Management  02/14/2015  Laura Savage [birth year] [id number]   Case Closure  [facility]ccessful [facility] with [facility] following several phone attempts and patient letter.  Case closed: unable to reach.  Plan: RN CM notified [facility] Care Management Assistant:  Close case RN CM sent MD case closure notification letter.    Donato Schultzrystal Camri Molloy, RN, BSN, [facility], CCM  [facility] Care [facility] Care Management Coordinator [phone] Direct [phone] Cell [phone] Office [phone] Fax

## 2015-02-15 DIAGNOSIS — G309 Alzheimer's disease, unspecified: Secondary | ICD-10-CM | POA: Diagnosis not present

## 2015-02-16 DIAGNOSIS — F0280 Dementia in other diseases classified elsewhere without behavioral disturbance: Secondary | ICD-10-CM | POA: Diagnosis not present

## 2015-02-16 DIAGNOSIS — N183 Chronic kidney disease, stage 3 (moderate): Secondary | ICD-10-CM | POA: Diagnosis not present

## 2015-02-16 DIAGNOSIS — I129 Hypertensive chronic kidney disease with stage 1 through stage 4 chronic kidney disease, or unspecified chronic kidney disease: Secondary | ICD-10-CM | POA: Diagnosis not present

## 2015-02-16 DIAGNOSIS — G309 Alzheimer's disease, unspecified: Secondary | ICD-10-CM | POA: Diagnosis not present

## 2015-02-16 DIAGNOSIS — M6281 Muscle weakness (generalized): Secondary | ICD-10-CM | POA: Diagnosis not present

## 2015-02-16 DIAGNOSIS — Z9181 History of falling: Secondary | ICD-10-CM | POA: Diagnosis not present

## 2015-02-16 DIAGNOSIS — F329 Major depressive disorder, single episode, unspecified: Secondary | ICD-10-CM | POA: Diagnosis not present

## 2015-02-17 DIAGNOSIS — G309 Alzheimer's disease, unspecified: Secondary | ICD-10-CM | POA: Diagnosis not present

## 2015-02-18 DIAGNOSIS — G309 Alzheimer's disease, unspecified: Secondary | ICD-10-CM | POA: Diagnosis not present

## 2015-02-19 DIAGNOSIS — I129 Hypertensive chronic kidney disease with stage 1 through stage 4 chronic kidney disease, or unspecified chronic kidney disease: Secondary | ICD-10-CM | POA: Diagnosis not present

## 2015-02-19 DIAGNOSIS — F0280 Dementia in other diseases classified elsewhere without behavioral disturbance: Secondary | ICD-10-CM | POA: Diagnosis not present

## 2015-02-19 DIAGNOSIS — G309 Alzheimer's disease, unspecified: Secondary | ICD-10-CM | POA: Diagnosis not present

## 2015-02-19 DIAGNOSIS — F329 Major depressive disorder, single episode, unspecified: Secondary | ICD-10-CM | POA: Diagnosis not present

## 2015-02-19 DIAGNOSIS — Z9181 History of falling: Secondary | ICD-10-CM | POA: Diagnosis not present

## 2015-02-19 DIAGNOSIS — M6281 Muscle weakness (generalized): Secondary | ICD-10-CM | POA: Diagnosis not present

## 2015-02-19 DIAGNOSIS — N183 Chronic kidney disease, stage 3 (moderate): Secondary | ICD-10-CM | POA: Diagnosis not present

## 2015-02-20 DIAGNOSIS — M6281 Muscle weakness (generalized): Secondary | ICD-10-CM | POA: Diagnosis not present

## 2015-02-20 DIAGNOSIS — N183 Chronic kidney disease, stage 3 (moderate): Secondary | ICD-10-CM | POA: Diagnosis not present

## 2015-02-20 DIAGNOSIS — F0280 Dementia in other diseases classified elsewhere without behavioral disturbance: Secondary | ICD-10-CM | POA: Diagnosis not present

## 2015-02-20 DIAGNOSIS — Z9181 History of falling: Secondary | ICD-10-CM | POA: Diagnosis not present

## 2015-02-20 DIAGNOSIS — I129 Hypertensive chronic kidney disease with stage 1 through stage 4 chronic kidney disease, or unspecified chronic kidney disease: Secondary | ICD-10-CM | POA: Diagnosis not present

## 2015-02-20 DIAGNOSIS — G309 Alzheimer's disease, unspecified: Secondary | ICD-10-CM | POA: Diagnosis not present

## 2015-02-20 DIAGNOSIS — F329 Major depressive disorder, single episode, unspecified: Secondary | ICD-10-CM | POA: Diagnosis not present

## 2015-02-21 DIAGNOSIS — G309 Alzheimer's disease, unspecified: Secondary | ICD-10-CM | POA: Diagnosis not present

## 2015-02-22 DIAGNOSIS — G309 Alzheimer's disease, unspecified: Secondary | ICD-10-CM | POA: Diagnosis not present

## 2015-02-23 DIAGNOSIS — G309 Alzheimer's disease, unspecified: Secondary | ICD-10-CM | POA: Diagnosis not present

## 2015-02-24 DIAGNOSIS — F0280 Dementia in other diseases classified elsewhere without behavioral disturbance: Secondary | ICD-10-CM | POA: Diagnosis not present

## 2015-02-24 DIAGNOSIS — I129 Hypertensive chronic kidney disease with stage 1 through stage 4 chronic kidney disease, or unspecified chronic kidney disease: Secondary | ICD-10-CM | POA: Diagnosis not present

## 2015-02-24 DIAGNOSIS — F329 Major depressive disorder, single episode, unspecified: Secondary | ICD-10-CM | POA: Diagnosis not present

## 2015-02-24 DIAGNOSIS — M6281 Muscle weakness (generalized): Secondary | ICD-10-CM | POA: Diagnosis not present

## 2015-02-24 DIAGNOSIS — Z9181 History of falling: Secondary | ICD-10-CM | POA: Diagnosis not present

## 2015-02-24 DIAGNOSIS — N183 Chronic kidney disease, stage 3 (moderate): Secondary | ICD-10-CM | POA: Diagnosis not present

## 2015-02-24 DIAGNOSIS — G309 Alzheimer's disease, unspecified: Secondary | ICD-10-CM | POA: Diagnosis not present

## 2015-02-25 DIAGNOSIS — F0280 Dementia in other diseases classified elsewhere without behavioral disturbance: Secondary | ICD-10-CM | POA: Diagnosis not present

## 2015-02-25 DIAGNOSIS — F329 Major depressive disorder, single episode, unspecified: Secondary | ICD-10-CM | POA: Diagnosis not present

## 2015-02-25 DIAGNOSIS — I129 Hypertensive chronic kidney disease with stage 1 through stage 4 chronic kidney disease, or unspecified chronic kidney disease: Secondary | ICD-10-CM | POA: Diagnosis not present

## 2015-02-25 DIAGNOSIS — N183 Chronic kidney disease, stage 3 (moderate): Secondary | ICD-10-CM | POA: Diagnosis not present

## 2015-02-25 DIAGNOSIS — G309 Alzheimer's disease, unspecified: Secondary | ICD-10-CM | POA: Diagnosis not present

## 2015-02-25 DIAGNOSIS — M6281 Muscle weakness (generalized): Secondary | ICD-10-CM | POA: Diagnosis not present

## 2015-02-25 DIAGNOSIS — Z9181 History of falling: Secondary | ICD-10-CM | POA: Diagnosis not present

## 2015-02-26 DIAGNOSIS — G309 Alzheimer's disease, unspecified: Secondary | ICD-10-CM | POA: Diagnosis not present

## 2015-02-27 DIAGNOSIS — Z9181 History of falling: Secondary | ICD-10-CM | POA: Diagnosis not present

## 2015-02-27 DIAGNOSIS — I129 Hypertensive chronic kidney disease with stage 1 through stage 4 chronic kidney disease, or unspecified chronic kidney disease: Secondary | ICD-10-CM | POA: Diagnosis not present

## 2015-02-27 DIAGNOSIS — F0280 Dementia in other diseases classified elsewhere without behavioral disturbance: Secondary | ICD-10-CM | POA: Diagnosis not present

## 2015-02-27 DIAGNOSIS — M6281 Muscle weakness (generalized): Secondary | ICD-10-CM | POA: Diagnosis not present

## 2015-02-27 DIAGNOSIS — F329 Major depressive disorder, single episode, unspecified: Secondary | ICD-10-CM | POA: Diagnosis not present

## 2015-02-27 DIAGNOSIS — N183 Chronic kidney disease, stage 3 (moderate): Secondary | ICD-10-CM | POA: Diagnosis not present

## 2015-02-27 DIAGNOSIS — G309 Alzheimer's disease, unspecified: Secondary | ICD-10-CM | POA: Diagnosis not present

## 2015-02-28 DIAGNOSIS — I129 Hypertensive chronic kidney disease with stage 1 through stage 4 chronic kidney disease, or unspecified chronic kidney disease: Secondary | ICD-10-CM | POA: Diagnosis not present

## 2015-02-28 DIAGNOSIS — G309 Alzheimer's disease, unspecified: Secondary | ICD-10-CM | POA: Diagnosis not present

## 2015-02-28 DIAGNOSIS — F0280 Dementia in other diseases classified elsewhere without behavioral disturbance: Secondary | ICD-10-CM | POA: Diagnosis not present

## 2015-02-28 DIAGNOSIS — M6281 Muscle weakness (generalized): Secondary | ICD-10-CM | POA: Diagnosis not present

## 2015-03-01 DIAGNOSIS — G309 Alzheimer's disease, unspecified: Secondary | ICD-10-CM | POA: Diagnosis not present

## 2015-03-02 DIAGNOSIS — G309 Alzheimer's disease, unspecified: Secondary | ICD-10-CM | POA: Diagnosis not present

## 2015-03-03 DIAGNOSIS — I129 Hypertensive chronic kidney disease with stage 1 through stage 4 chronic kidney disease, or unspecified chronic kidney disease: Secondary | ICD-10-CM | POA: Diagnosis not present

## 2015-03-03 DIAGNOSIS — M79671 Pain in right foot: Secondary | ICD-10-CM | POA: Diagnosis not present

## 2015-03-03 DIAGNOSIS — M6281 Muscle weakness (generalized): Secondary | ICD-10-CM | POA: Diagnosis not present

## 2015-03-03 DIAGNOSIS — B351 Tinea unguium: Secondary | ICD-10-CM | POA: Diagnosis not present

## 2015-03-03 DIAGNOSIS — F0280 Dementia in other diseases classified elsewhere without behavioral disturbance: Secondary | ICD-10-CM | POA: Diagnosis not present

## 2015-03-03 DIAGNOSIS — M79672 Pain in left foot: Secondary | ICD-10-CM | POA: Diagnosis not present

## 2015-03-03 DIAGNOSIS — Z9181 History of falling: Secondary | ICD-10-CM | POA: Diagnosis not present

## 2015-03-03 DIAGNOSIS — G309 Alzheimer's disease, unspecified: Secondary | ICD-10-CM | POA: Diagnosis not present

## 2015-03-03 DIAGNOSIS — N183 Chronic kidney disease, stage 3 (moderate): Secondary | ICD-10-CM | POA: Diagnosis not present

## 2015-03-03 DIAGNOSIS — F329 Major depressive disorder, single episode, unspecified: Secondary | ICD-10-CM | POA: Diagnosis not present

## 2015-03-04 DIAGNOSIS — G309 Alzheimer's disease, unspecified: Secondary | ICD-10-CM | POA: Diagnosis not present

## 2015-03-05 DIAGNOSIS — M6281 Muscle weakness (generalized): Secondary | ICD-10-CM | POA: Diagnosis not present

## 2015-03-05 DIAGNOSIS — Z9181 History of falling: Secondary | ICD-10-CM | POA: Diagnosis not present

## 2015-03-05 DIAGNOSIS — F329 Major depressive disorder, single episode, unspecified: Secondary | ICD-10-CM | POA: Diagnosis not present

## 2015-03-05 DIAGNOSIS — N183 Chronic kidney disease, stage 3 (moderate): Secondary | ICD-10-CM | POA: Diagnosis not present

## 2015-03-05 DIAGNOSIS — F0280 Dementia in other diseases classified elsewhere without behavioral disturbance: Secondary | ICD-10-CM | POA: Diagnosis not present

## 2015-03-05 DIAGNOSIS — G309 Alzheimer's disease, unspecified: Secondary | ICD-10-CM | POA: Diagnosis not present

## 2015-03-05 DIAGNOSIS — I129 Hypertensive chronic kidney disease with stage 1 through stage 4 chronic kidney disease, or unspecified chronic kidney disease: Secondary | ICD-10-CM | POA: Diagnosis not present

## 2015-03-06 DIAGNOSIS — F0280 Dementia in other diseases classified elsewhere without behavioral disturbance: Secondary | ICD-10-CM | POA: Diagnosis not present

## 2015-03-06 DIAGNOSIS — G309 Alzheimer's disease, unspecified: Secondary | ICD-10-CM | POA: Diagnosis not present

## 2015-03-06 DIAGNOSIS — I129 Hypertensive chronic kidney disease with stage 1 through stage 4 chronic kidney disease, or unspecified chronic kidney disease: Secondary | ICD-10-CM | POA: Diagnosis not present

## 2015-03-06 DIAGNOSIS — M6281 Muscle weakness (generalized): Secondary | ICD-10-CM | POA: Diagnosis not present

## 2015-03-06 DIAGNOSIS — Z9181 History of falling: Secondary | ICD-10-CM | POA: Diagnosis not present

## 2015-03-06 DIAGNOSIS — N183 Chronic kidney disease, stage 3 (moderate): Secondary | ICD-10-CM | POA: Diagnosis not present

## 2015-03-06 DIAGNOSIS — F329 Major depressive disorder, single episode, unspecified: Secondary | ICD-10-CM | POA: Diagnosis not present

## 2015-03-07 DIAGNOSIS — G309 Alzheimer's disease, unspecified: Secondary | ICD-10-CM | POA: Diagnosis not present

## 2015-03-07 DIAGNOSIS — R32 Unspecified urinary incontinence: Secondary | ICD-10-CM | POA: Diagnosis not present

## 2015-03-08 DIAGNOSIS — G309 Alzheimer's disease, unspecified: Secondary | ICD-10-CM | POA: Diagnosis not present

## 2015-03-09 DIAGNOSIS — F0280 Dementia in other diseases classified elsewhere without behavioral disturbance: Secondary | ICD-10-CM | POA: Diagnosis not present

## 2015-03-09 DIAGNOSIS — G309 Alzheimer's disease, unspecified: Secondary | ICD-10-CM | POA: Diagnosis not present

## 2015-03-09 DIAGNOSIS — I129 Hypertensive chronic kidney disease with stage 1 through stage 4 chronic kidney disease, or unspecified chronic kidney disease: Secondary | ICD-10-CM | POA: Diagnosis not present

## 2015-03-09 DIAGNOSIS — N183 Chronic kidney disease, stage 3 (moderate): Secondary | ICD-10-CM | POA: Diagnosis not present

## 2015-03-09 DIAGNOSIS — Z9181 History of falling: Secondary | ICD-10-CM | POA: Diagnosis not present

## 2015-03-09 DIAGNOSIS — M6281 Muscle weakness (generalized): Secondary | ICD-10-CM | POA: Diagnosis not present

## 2015-03-09 DIAGNOSIS — F329 Major depressive disorder, single episode, unspecified: Secondary | ICD-10-CM | POA: Diagnosis not present

## 2015-03-10 DIAGNOSIS — G309 Alzheimer's disease, unspecified: Secondary | ICD-10-CM | POA: Diagnosis not present

## 2015-03-11 DIAGNOSIS — G309 Alzheimer's disease, unspecified: Secondary | ICD-10-CM | POA: Diagnosis not present

## 2015-03-12 DIAGNOSIS — G309 Alzheimer's disease, unspecified: Secondary | ICD-10-CM | POA: Diagnosis not present

## 2015-03-13 DIAGNOSIS — R451 Restlessness and agitation: Secondary | ICD-10-CM | POA: Diagnosis not present

## 2015-03-13 DIAGNOSIS — Z9181 History of falling: Secondary | ICD-10-CM | POA: Diagnosis not present

## 2015-03-13 DIAGNOSIS — F411 Generalized anxiety disorder: Secondary | ICD-10-CM | POA: Diagnosis not present

## 2015-03-13 DIAGNOSIS — M6281 Muscle weakness (generalized): Secondary | ICD-10-CM | POA: Diagnosis not present

## 2015-03-13 DIAGNOSIS — I129 Hypertensive chronic kidney disease with stage 1 through stage 4 chronic kidney disease, or unspecified chronic kidney disease: Secondary | ICD-10-CM | POA: Diagnosis not present

## 2015-03-13 DIAGNOSIS — F329 Major depressive disorder, single episode, unspecified: Secondary | ICD-10-CM | POA: Diagnosis not present

## 2015-03-13 DIAGNOSIS — F0280 Dementia in other diseases classified elsewhere without behavioral disturbance: Secondary | ICD-10-CM | POA: Diagnosis not present

## 2015-03-13 DIAGNOSIS — G309 Alzheimer's disease, unspecified: Secondary | ICD-10-CM | POA: Diagnosis not present

## 2015-03-13 DIAGNOSIS — N183 Chronic kidney disease, stage 3 (moderate): Secondary | ICD-10-CM | POA: Diagnosis not present

## 2015-03-13 DIAGNOSIS — F0391 Unspecified dementia with behavioral disturbance: Secondary | ICD-10-CM | POA: Diagnosis not present

## 2015-03-14 DIAGNOSIS — G309 Alzheimer's disease, unspecified: Secondary | ICD-10-CM | POA: Diagnosis not present

## 2015-03-15 DIAGNOSIS — G309 Alzheimer's disease, unspecified: Secondary | ICD-10-CM | POA: Diagnosis not present

## 2015-03-16 DIAGNOSIS — N183 Chronic kidney disease, stage 3 (moderate): Secondary | ICD-10-CM | POA: Diagnosis not present

## 2015-03-16 DIAGNOSIS — I129 Hypertensive chronic kidney disease with stage 1 through stage 4 chronic kidney disease, or unspecified chronic kidney disease: Secondary | ICD-10-CM | POA: Diagnosis not present

## 2015-03-16 DIAGNOSIS — Z9181 History of falling: Secondary | ICD-10-CM | POA: Diagnosis not present

## 2015-03-16 DIAGNOSIS — M6281 Muscle weakness (generalized): Secondary | ICD-10-CM | POA: Diagnosis not present

## 2015-03-16 DIAGNOSIS — G309 Alzheimer's disease, unspecified: Secondary | ICD-10-CM | POA: Diagnosis not present

## 2015-03-16 DIAGNOSIS — F329 Major depressive disorder, single episode, unspecified: Secondary | ICD-10-CM | POA: Diagnosis not present

## 2015-03-16 DIAGNOSIS — F0280 Dementia in other diseases classified elsewhere without behavioral disturbance: Secondary | ICD-10-CM | POA: Diagnosis not present

## 2015-03-17 DIAGNOSIS — G309 Alzheimer's disease, unspecified: Secondary | ICD-10-CM | POA: Diagnosis not present

## 2015-03-18 DIAGNOSIS — G309 Alzheimer's disease, unspecified: Secondary | ICD-10-CM | POA: Diagnosis not present

## 2015-03-19 DIAGNOSIS — G309 Alzheimer's disease, unspecified: Secondary | ICD-10-CM | POA: Diagnosis not present

## 2015-03-20 DIAGNOSIS — F329 Major depressive disorder, single episode, unspecified: Secondary | ICD-10-CM | POA: Diagnosis not present

## 2015-03-20 DIAGNOSIS — F0390 Unspecified dementia without behavioral disturbance: Secondary | ICD-10-CM | POA: Diagnosis not present

## 2015-03-20 DIAGNOSIS — K219 Gastro-esophageal reflux disease without esophagitis: Secondary | ICD-10-CM | POA: Diagnosis not present

## 2015-03-20 DIAGNOSIS — G309 Alzheimer's disease, unspecified: Secondary | ICD-10-CM | POA: Diagnosis not present

## 2015-03-20 DIAGNOSIS — I129 Hypertensive chronic kidney disease with stage 1 through stage 4 chronic kidney disease, or unspecified chronic kidney disease: Secondary | ICD-10-CM | POA: Diagnosis not present

## 2015-03-20 DIAGNOSIS — F0280 Dementia in other diseases classified elsewhere without behavioral disturbance: Secondary | ICD-10-CM | POA: Diagnosis not present

## 2015-03-20 DIAGNOSIS — N183 Chronic kidney disease, stage 3 (moderate): Secondary | ICD-10-CM | POA: Diagnosis not present

## 2015-03-20 DIAGNOSIS — I10 Essential (primary) hypertension: Secondary | ICD-10-CM | POA: Diagnosis not present

## 2015-03-20 DIAGNOSIS — Z9181 History of falling: Secondary | ICD-10-CM | POA: Diagnosis not present

## 2015-03-20 DIAGNOSIS — N189 Chronic kidney disease, unspecified: Secondary | ICD-10-CM | POA: Diagnosis not present

## 2015-03-20 DIAGNOSIS — M6281 Muscle weakness (generalized): Secondary | ICD-10-CM | POA: Diagnosis not present

## 2015-03-21 DIAGNOSIS — G309 Alzheimer's disease, unspecified: Secondary | ICD-10-CM | POA: Diagnosis not present

## 2015-03-22 DIAGNOSIS — G309 Alzheimer's disease, unspecified: Secondary | ICD-10-CM | POA: Diagnosis not present

## 2015-03-23 DIAGNOSIS — G309 Alzheimer's disease, unspecified: Secondary | ICD-10-CM | POA: Diagnosis not present

## 2015-03-24 DIAGNOSIS — G309 Alzheimer's disease, unspecified: Secondary | ICD-10-CM | POA: Diagnosis not present

## 2015-03-25 DIAGNOSIS — G309 Alzheimer's disease, unspecified: Secondary | ICD-10-CM | POA: Diagnosis not present

## 2015-03-26 DIAGNOSIS — G309 Alzheimer's disease, unspecified: Secondary | ICD-10-CM | POA: Diagnosis not present

## 2015-03-27 DIAGNOSIS — G309 Alzheimer's disease, unspecified: Secondary | ICD-10-CM | POA: Diagnosis not present

## 2015-03-28 DIAGNOSIS — G309 Alzheimer's disease, unspecified: Secondary | ICD-10-CM | POA: Diagnosis not present

## 2015-03-29 DIAGNOSIS — G309 Alzheimer's disease, unspecified: Secondary | ICD-10-CM | POA: Diagnosis not present

## 2015-03-30 DIAGNOSIS — G309 Alzheimer's disease, unspecified: Secondary | ICD-10-CM | POA: Diagnosis not present

## 2015-03-31 DIAGNOSIS — G309 Alzheimer's disease, unspecified: Secondary | ICD-10-CM | POA: Diagnosis not present

## 2015-04-01 DIAGNOSIS — G309 Alzheimer's disease, unspecified: Secondary | ICD-10-CM | POA: Diagnosis not present

## 2015-04-02 DIAGNOSIS — G309 Alzheimer's disease, unspecified: Secondary | ICD-10-CM | POA: Diagnosis not present

## 2015-04-03 DIAGNOSIS — G309 Alzheimer's disease, unspecified: Secondary | ICD-10-CM | POA: Diagnosis not present

## 2015-04-04 ENCOUNTER — Ambulatory Visit: Payer: Self-pay

## 2015-04-04 DIAGNOSIS — G309 Alzheimer's disease, unspecified: Secondary | ICD-10-CM | POA: Diagnosis not present

## 2015-04-05 DIAGNOSIS — G309 Alzheimer's disease, unspecified: Secondary | ICD-10-CM | POA: Diagnosis not present

## 2015-04-06 DIAGNOSIS — G309 Alzheimer's disease, unspecified: Secondary | ICD-10-CM | POA: Diagnosis not present

## 2015-04-07 DIAGNOSIS — G309 Alzheimer's disease, unspecified: Secondary | ICD-10-CM | POA: Diagnosis not present

## 2015-04-08 DIAGNOSIS — G309 Alzheimer's disease, unspecified: Secondary | ICD-10-CM | POA: Diagnosis not present

## 2015-04-09 ENCOUNTER — Other Ambulatory Visit: Payer: Self-pay

## 2015-04-09 DIAGNOSIS — R32 Unspecified urinary incontinence: Secondary | ICD-10-CM | POA: Diagnosis not present

## 2015-04-09 DIAGNOSIS — G309 Alzheimer's disease, unspecified: Secondary | ICD-10-CM | POA: Diagnosis not present

## 2015-04-09 NOTE — Patient Outreach (Signed)
[facility]) Care Management  04/09/2015  RENIYA MCCLEES [birth year] [id number]  Referral Date:  03/31/2015 Source:  [facility] HMO tier 4 Issue:  DM with 4 ED visits and no admissions (per [facility])  Outreach call #1 to patient.  Patient not reached.  RN CM left HIPAA compliant voice message with name and number for call back.  RN CM scheduled for next outreach call within 1 week.   Donato Schultz, RN, BSN, [facility], CCM  [facility] Management Coordinator [phone] Direct [phone] Cell [phone] Office [phone] Fax

## 2015-04-10 ENCOUNTER — Other Ambulatory Visit: Payer: Self-pay

## 2015-04-10 DIAGNOSIS — G309 Alzheimer's disease, unspecified: Secondary | ICD-10-CM | POA: Diagnosis not present

## 2015-04-10 NOTE — Patient Outreach (Addendum)
[facility]) Care Management  04/10/2015  ROCKLYN MAYBERRY [birth year] [id number]   Referral Date: 03/31/2015 Source: [facility] HMO tier 4 Issue: DM with 4 ED visits and no admissions (per [facility])  Outreach call #2 to patient. Patient's son, Celine Mans reached at [phone].   Son states patient resides at [facility] for a couple of years now which is for LTC.  Son confirms no needs and facility is able to meet whatever needs patient and family have at this time.   Plan: Case closed:  Patient assessed; no further intervention needed.  Patient resides in LTC facility.   [facility] Care Management Assistant notified.  Primary MD notified via case closure letter.   Donato Schultz, RN, BSN, [facility], CCM  [facility] Management Coordinator [phone] Direct [phone] Cell [phone] Office [phone] Fax

## 2015-04-11 DIAGNOSIS — G309 Alzheimer's disease, unspecified: Secondary | ICD-10-CM | POA: Diagnosis not present

## 2015-04-12 DIAGNOSIS — G309 Alzheimer's disease, unspecified: Secondary | ICD-10-CM | POA: Diagnosis not present

## 2015-04-13 DIAGNOSIS — G309 Alzheimer's disease, unspecified: Secondary | ICD-10-CM | POA: Diagnosis not present

## 2015-04-14 DIAGNOSIS — F0391 Unspecified dementia with behavioral disturbance: Secondary | ICD-10-CM | POA: Diagnosis not present

## 2015-04-14 DIAGNOSIS — R451 Restlessness and agitation: Secondary | ICD-10-CM | POA: Diagnosis not present

## 2015-04-21 DIAGNOSIS — G309 Alzheimer's disease, unspecified: Secondary | ICD-10-CM | POA: Diagnosis not present

## 2015-04-22 DIAGNOSIS — G309 Alzheimer's disease, unspecified: Secondary | ICD-10-CM | POA: Diagnosis not present

## 2015-04-23 DIAGNOSIS — G309 Alzheimer's disease, unspecified: Secondary | ICD-10-CM | POA: Diagnosis not present

## 2015-04-24 DIAGNOSIS — G309 Alzheimer's disease, unspecified: Secondary | ICD-10-CM | POA: Diagnosis not present

## 2015-04-25 DIAGNOSIS — G309 Alzheimer's disease, unspecified: Secondary | ICD-10-CM | POA: Diagnosis not present

## 2015-04-26 DIAGNOSIS — G309 Alzheimer's disease, unspecified: Secondary | ICD-10-CM | POA: Diagnosis not present

## 2015-04-27 DIAGNOSIS — G309 Alzheimer's disease, unspecified: Secondary | ICD-10-CM | POA: Diagnosis not present

## 2015-04-28 DIAGNOSIS — G309 Alzheimer's disease, unspecified: Secondary | ICD-10-CM | POA: Diagnosis not present

## 2015-04-29 DIAGNOSIS — G309 Alzheimer's disease, unspecified: Secondary | ICD-10-CM | POA: Diagnosis not present

## 2015-04-30 DIAGNOSIS — G309 Alzheimer's disease, unspecified: Secondary | ICD-10-CM | POA: Diagnosis not present

## 2015-05-01 DIAGNOSIS — G309 Alzheimer's disease, unspecified: Secondary | ICD-10-CM | POA: Diagnosis not present

## 2015-05-02 DIAGNOSIS — G309 Alzheimer's disease, unspecified: Secondary | ICD-10-CM | POA: Diagnosis not present

## 2015-05-03 DIAGNOSIS — G309 Alzheimer's disease, unspecified: Secondary | ICD-10-CM | POA: Diagnosis not present

## 2015-05-04 DIAGNOSIS — G309 Alzheimer's disease, unspecified: Secondary | ICD-10-CM | POA: Diagnosis not present

## 2015-05-05 DIAGNOSIS — G309 Alzheimer's disease, unspecified: Secondary | ICD-10-CM | POA: Diagnosis not present

## 2015-05-06 DIAGNOSIS — G309 Alzheimer's disease, unspecified: Secondary | ICD-10-CM | POA: Diagnosis not present

## 2015-05-07 DIAGNOSIS — G309 Alzheimer's disease, unspecified: Secondary | ICD-10-CM | POA: Diagnosis not present

## 2015-05-08 DIAGNOSIS — G309 Alzheimer's disease, unspecified: Secondary | ICD-10-CM | POA: Diagnosis not present

## 2015-05-08 DIAGNOSIS — R32 Unspecified urinary incontinence: Secondary | ICD-10-CM | POA: Diagnosis not present

## 2015-05-09 DIAGNOSIS — G309 Alzheimer's disease, unspecified: Secondary | ICD-10-CM | POA: Diagnosis not present

## 2015-05-10 DIAGNOSIS — G309 Alzheimer's disease, unspecified: Secondary | ICD-10-CM | POA: Diagnosis not present

## 2015-05-11 DIAGNOSIS — G309 Alzheimer's disease, unspecified: Secondary | ICD-10-CM | POA: Diagnosis not present

## 2015-05-12 DIAGNOSIS — F0391 Unspecified dementia with behavioral disturbance: Secondary | ICD-10-CM | POA: Diagnosis not present

## 2015-05-12 DIAGNOSIS — G309 Alzheimer's disease, unspecified: Secondary | ICD-10-CM | POA: Diagnosis not present

## 2015-05-12 DIAGNOSIS — R451 Restlessness and agitation: Secondary | ICD-10-CM | POA: Diagnosis not present

## 2015-05-13 DIAGNOSIS — G309 Alzheimer's disease, unspecified: Secondary | ICD-10-CM | POA: Diagnosis not present

## 2015-05-14 DIAGNOSIS — G309 Alzheimer's disease, unspecified: Secondary | ICD-10-CM | POA: Diagnosis not present

## 2015-05-15 DIAGNOSIS — G309 Alzheimer's disease, unspecified: Secondary | ICD-10-CM | POA: Diagnosis not present

## 2015-05-15 DIAGNOSIS — Z79891 Long term (current) use of opiate analgesic: Secondary | ICD-10-CM | POA: Diagnosis not present

## 2015-05-15 DIAGNOSIS — E1165 Type 2 diabetes mellitus with hyperglycemia: Secondary | ICD-10-CM | POA: Diagnosis not present

## 2015-05-15 DIAGNOSIS — E038 Other specified hypothyroidism: Secondary | ICD-10-CM | POA: Diagnosis not present

## 2015-05-15 DIAGNOSIS — E782 Mixed hyperlipidemia: Secondary | ICD-10-CM | POA: Diagnosis not present

## 2015-05-15 DIAGNOSIS — D518 Other vitamin B12 deficiency anemias: Secondary | ICD-10-CM | POA: Diagnosis not present

## 2015-05-16 DIAGNOSIS — G309 Alzheimer's disease, unspecified: Secondary | ICD-10-CM | POA: Diagnosis not present

## 2015-05-17 DIAGNOSIS — G309 Alzheimer's disease, unspecified: Secondary | ICD-10-CM | POA: Diagnosis not present

## 2015-05-18 DIAGNOSIS — G309 Alzheimer's disease, unspecified: Secondary | ICD-10-CM | POA: Diagnosis not present

## 2015-05-19 DIAGNOSIS — G309 Alzheimer's disease, unspecified: Secondary | ICD-10-CM | POA: Diagnosis not present

## 2015-05-20 DIAGNOSIS — G309 Alzheimer's disease, unspecified: Secondary | ICD-10-CM | POA: Diagnosis not present

## 2015-05-21 DIAGNOSIS — G309 Alzheimer's disease, unspecified: Secondary | ICD-10-CM | POA: Diagnosis not present

## 2015-05-22 DIAGNOSIS — E784 Other hyperlipidemia: Secondary | ICD-10-CM | POA: Diagnosis not present

## 2015-05-22 DIAGNOSIS — K219 Gastro-esophageal reflux disease without esophagitis: Secondary | ICD-10-CM | POA: Diagnosis not present

## 2015-05-22 DIAGNOSIS — F0390 Unspecified dementia without behavioral disturbance: Secondary | ICD-10-CM | POA: Diagnosis not present

## 2015-05-22 DIAGNOSIS — I10 Essential (primary) hypertension: Secondary | ICD-10-CM | POA: Diagnosis not present

## 2015-05-22 DIAGNOSIS — G309 Alzheimer's disease, unspecified: Secondary | ICD-10-CM | POA: Diagnosis not present

## 2015-05-23 DIAGNOSIS — G309 Alzheimer's disease, unspecified: Secondary | ICD-10-CM | POA: Diagnosis not present

## 2015-05-24 DIAGNOSIS — G309 Alzheimer's disease, unspecified: Secondary | ICD-10-CM | POA: Diagnosis not present

## 2015-05-25 DIAGNOSIS — G309 Alzheimer's disease, unspecified: Secondary | ICD-10-CM | POA: Diagnosis not present

## 2015-05-26 DIAGNOSIS — G309 Alzheimer's disease, unspecified: Secondary | ICD-10-CM | POA: Diagnosis not present

## 2015-05-27 DIAGNOSIS — G309 Alzheimer's disease, unspecified: Secondary | ICD-10-CM | POA: Diagnosis not present

## 2015-05-28 DIAGNOSIS — F411 Generalized anxiety disorder: Secondary | ICD-10-CM | POA: Diagnosis not present

## 2015-05-28 DIAGNOSIS — F0391 Unspecified dementia with behavioral disturbance: Secondary | ICD-10-CM | POA: Diagnosis not present

## 2015-05-28 DIAGNOSIS — G309 Alzheimer's disease, unspecified: Secondary | ICD-10-CM | POA: Diagnosis not present

## 2015-05-29 DIAGNOSIS — I129 Hypertensive chronic kidney disease with stage 1 through stage 4 chronic kidney disease, or unspecified chronic kidney disease: Secondary | ICD-10-CM | POA: Diagnosis not present

## 2015-05-29 DIAGNOSIS — M6281 Muscle weakness (generalized): Secondary | ICD-10-CM | POA: Diagnosis not present

## 2015-05-29 DIAGNOSIS — N183 Chronic kidney disease, stage 3 (moderate): Secondary | ICD-10-CM | POA: Diagnosis not present

## 2015-05-29 DIAGNOSIS — G309 Alzheimer's disease, unspecified: Secondary | ICD-10-CM | POA: Diagnosis not present

## 2015-05-29 DIAGNOSIS — F411 Generalized anxiety disorder: Secondary | ICD-10-CM | POA: Diagnosis not present

## 2015-05-29 DIAGNOSIS — F329 Major depressive disorder, single episode, unspecified: Secondary | ICD-10-CM | POA: Diagnosis not present

## 2015-05-29 DIAGNOSIS — F0281 Dementia in other diseases classified elsewhere with behavioral disturbance: Secondary | ICD-10-CM | POA: Diagnosis not present

## 2015-05-30 DIAGNOSIS — G309 Alzheimer's disease, unspecified: Secondary | ICD-10-CM | POA: Diagnosis not present

## 2015-05-31 DIAGNOSIS — G309 Alzheimer's disease, unspecified: Secondary | ICD-10-CM | POA: Diagnosis not present

## 2015-06-01 DIAGNOSIS — G309 Alzheimer's disease, unspecified: Secondary | ICD-10-CM | POA: Diagnosis not present

## 2015-06-02 DIAGNOSIS — F0281 Dementia in other diseases classified elsewhere with behavioral disturbance: Secondary | ICD-10-CM | POA: Diagnosis not present

## 2015-06-02 DIAGNOSIS — I129 Hypertensive chronic kidney disease with stage 1 through stage 4 chronic kidney disease, or unspecified chronic kidney disease: Secondary | ICD-10-CM | POA: Diagnosis not present

## 2015-06-02 DIAGNOSIS — N183 Chronic kidney disease, stage 3 (moderate): Secondary | ICD-10-CM | POA: Diagnosis not present

## 2015-06-02 DIAGNOSIS — G309 Alzheimer's disease, unspecified: Secondary | ICD-10-CM | POA: Diagnosis not present

## 2015-06-02 DIAGNOSIS — F329 Major depressive disorder, single episode, unspecified: Secondary | ICD-10-CM | POA: Diagnosis not present

## 2015-06-02 DIAGNOSIS — M6281 Muscle weakness (generalized): Secondary | ICD-10-CM | POA: Diagnosis not present

## 2015-06-02 DIAGNOSIS — F411 Generalized anxiety disorder: Secondary | ICD-10-CM | POA: Diagnosis not present

## 2015-06-03 DIAGNOSIS — F411 Generalized anxiety disorder: Secondary | ICD-10-CM | POA: Diagnosis not present

## 2015-06-03 DIAGNOSIS — F0281 Dementia in other diseases classified elsewhere with behavioral disturbance: Secondary | ICD-10-CM | POA: Diagnosis not present

## 2015-06-03 DIAGNOSIS — G309 Alzheimer's disease, unspecified: Secondary | ICD-10-CM | POA: Diagnosis not present

## 2015-06-03 DIAGNOSIS — M6281 Muscle weakness (generalized): Secondary | ICD-10-CM | POA: Diagnosis not present

## 2015-06-03 DIAGNOSIS — N183 Chronic kidney disease, stage 3 (moderate): Secondary | ICD-10-CM | POA: Diagnosis not present

## 2015-06-03 DIAGNOSIS — F329 Major depressive disorder, single episode, unspecified: Secondary | ICD-10-CM | POA: Diagnosis not present

## 2015-06-03 DIAGNOSIS — I129 Hypertensive chronic kidney disease with stage 1 through stage 4 chronic kidney disease, or unspecified chronic kidney disease: Secondary | ICD-10-CM | POA: Diagnosis not present

## 2015-06-04 DIAGNOSIS — G309 Alzheimer's disease, unspecified: Secondary | ICD-10-CM | POA: Diagnosis not present

## 2015-06-05 DIAGNOSIS — F411 Generalized anxiety disorder: Secondary | ICD-10-CM | POA: Diagnosis not present

## 2015-06-05 DIAGNOSIS — G309 Alzheimer's disease, unspecified: Secondary | ICD-10-CM | POA: Diagnosis not present

## 2015-06-05 DIAGNOSIS — I129 Hypertensive chronic kidney disease with stage 1 through stage 4 chronic kidney disease, or unspecified chronic kidney disease: Secondary | ICD-10-CM | POA: Diagnosis not present

## 2015-06-05 DIAGNOSIS — F0281 Dementia in other diseases classified elsewhere with behavioral disturbance: Secondary | ICD-10-CM | POA: Diagnosis not present

## 2015-06-05 DIAGNOSIS — F329 Major depressive disorder, single episode, unspecified: Secondary | ICD-10-CM | POA: Diagnosis not present

## 2015-06-05 DIAGNOSIS — M6281 Muscle weakness (generalized): Secondary | ICD-10-CM | POA: Diagnosis not present

## 2015-06-05 DIAGNOSIS — N183 Chronic kidney disease, stage 3 (moderate): Secondary | ICD-10-CM | POA: Diagnosis not present

## 2015-06-06 DIAGNOSIS — M79675 Pain in left toe(s): Secondary | ICD-10-CM | POA: Diagnosis not present

## 2015-06-06 DIAGNOSIS — M6281 Muscle weakness (generalized): Secondary | ICD-10-CM | POA: Diagnosis not present

## 2015-06-06 DIAGNOSIS — F411 Generalized anxiety disorder: Secondary | ICD-10-CM | POA: Diagnosis not present

## 2015-06-06 DIAGNOSIS — B351 Tinea unguium: Secondary | ICD-10-CM | POA: Diagnosis not present

## 2015-06-06 DIAGNOSIS — F0281 Dementia in other diseases classified elsewhere with behavioral disturbance: Secondary | ICD-10-CM | POA: Diagnosis not present

## 2015-06-06 DIAGNOSIS — N183 Chronic kidney disease, stage 3 (moderate): Secondary | ICD-10-CM | POA: Diagnosis not present

## 2015-06-06 DIAGNOSIS — F329 Major depressive disorder, single episode, unspecified: Secondary | ICD-10-CM | POA: Diagnosis not present

## 2015-06-06 DIAGNOSIS — M79674 Pain in right toe(s): Secondary | ICD-10-CM | POA: Diagnosis not present

## 2015-06-06 DIAGNOSIS — I70203 Unspecified atherosclerosis of native arteries of extremities, bilateral legs: Secondary | ICD-10-CM | POA: Diagnosis not present

## 2015-06-06 DIAGNOSIS — I129 Hypertensive chronic kidney disease with stage 1 through stage 4 chronic kidney disease, or unspecified chronic kidney disease: Secondary | ICD-10-CM | POA: Diagnosis not present

## 2015-06-06 DIAGNOSIS — G309 Alzheimer's disease, unspecified: Secondary | ICD-10-CM | POA: Diagnosis not present

## 2015-06-07 DIAGNOSIS — G309 Alzheimer's disease, unspecified: Secondary | ICD-10-CM | POA: Diagnosis not present

## 2015-06-08 DIAGNOSIS — G309 Alzheimer's disease, unspecified: Secondary | ICD-10-CM | POA: Diagnosis not present

## 2015-06-09 DIAGNOSIS — M6281 Muscle weakness (generalized): Secondary | ICD-10-CM | POA: Diagnosis not present

## 2015-06-09 DIAGNOSIS — F329 Major depressive disorder, single episode, unspecified: Secondary | ICD-10-CM | POA: Diagnosis not present

## 2015-06-09 DIAGNOSIS — F0281 Dementia in other diseases classified elsewhere with behavioral disturbance: Secondary | ICD-10-CM | POA: Diagnosis not present

## 2015-06-09 DIAGNOSIS — G309 Alzheimer's disease, unspecified: Secondary | ICD-10-CM | POA: Diagnosis not present

## 2015-06-09 DIAGNOSIS — N183 Chronic kidney disease, stage 3 (moderate): Secondary | ICD-10-CM | POA: Diagnosis not present

## 2015-06-09 DIAGNOSIS — I129 Hypertensive chronic kidney disease with stage 1 through stage 4 chronic kidney disease, or unspecified chronic kidney disease: Secondary | ICD-10-CM | POA: Diagnosis not present

## 2015-06-09 DIAGNOSIS — F411 Generalized anxiety disorder: Secondary | ICD-10-CM | POA: Diagnosis not present

## 2015-06-10 DIAGNOSIS — G309 Alzheimer's disease, unspecified: Secondary | ICD-10-CM | POA: Diagnosis not present

## 2015-06-10 DIAGNOSIS — R32 Unspecified urinary incontinence: Secondary | ICD-10-CM | POA: Diagnosis not present

## 2015-06-11 DIAGNOSIS — F411 Generalized anxiety disorder: Secondary | ICD-10-CM | POA: Diagnosis not present

## 2015-06-11 DIAGNOSIS — N183 Chronic kidney disease, stage 3 (moderate): Secondary | ICD-10-CM | POA: Diagnosis not present

## 2015-06-11 DIAGNOSIS — I129 Hypertensive chronic kidney disease with stage 1 through stage 4 chronic kidney disease, or unspecified chronic kidney disease: Secondary | ICD-10-CM | POA: Diagnosis not present

## 2015-06-11 DIAGNOSIS — F0281 Dementia in other diseases classified elsewhere with behavioral disturbance: Secondary | ICD-10-CM | POA: Diagnosis not present

## 2015-06-11 DIAGNOSIS — F329 Major depressive disorder, single episode, unspecified: Secondary | ICD-10-CM | POA: Diagnosis not present

## 2015-06-11 DIAGNOSIS — G309 Alzheimer's disease, unspecified: Secondary | ICD-10-CM | POA: Diagnosis not present

## 2015-06-11 DIAGNOSIS — M6281 Muscle weakness (generalized): Secondary | ICD-10-CM | POA: Diagnosis not present

## 2015-06-12 DIAGNOSIS — F0391 Unspecified dementia with behavioral disturbance: Secondary | ICD-10-CM | POA: Diagnosis not present

## 2015-06-12 DIAGNOSIS — R451 Restlessness and agitation: Secondary | ICD-10-CM | POA: Diagnosis not present

## 2015-06-12 DIAGNOSIS — G309 Alzheimer's disease, unspecified: Secondary | ICD-10-CM | POA: Diagnosis not present

## 2015-06-13 DIAGNOSIS — F0281 Dementia in other diseases classified elsewhere with behavioral disturbance: Secondary | ICD-10-CM | POA: Diagnosis not present

## 2015-06-13 DIAGNOSIS — M6281 Muscle weakness (generalized): Secondary | ICD-10-CM | POA: Diagnosis not present

## 2015-06-13 DIAGNOSIS — G309 Alzheimer's disease, unspecified: Secondary | ICD-10-CM | POA: Diagnosis not present

## 2015-06-13 DIAGNOSIS — I129 Hypertensive chronic kidney disease with stage 1 through stage 4 chronic kidney disease, or unspecified chronic kidney disease: Secondary | ICD-10-CM | POA: Diagnosis not present

## 2015-06-13 DIAGNOSIS — F329 Major depressive disorder, single episode, unspecified: Secondary | ICD-10-CM | POA: Diagnosis not present

## 2015-06-13 DIAGNOSIS — F411 Generalized anxiety disorder: Secondary | ICD-10-CM | POA: Diagnosis not present

## 2015-06-13 DIAGNOSIS — N183 Chronic kidney disease, stage 3 (moderate): Secondary | ICD-10-CM | POA: Diagnosis not present

## 2015-06-14 DIAGNOSIS — G309 Alzheimer's disease, unspecified: Secondary | ICD-10-CM | POA: Diagnosis not present

## 2015-06-15 DIAGNOSIS — G309 Alzheimer's disease, unspecified: Secondary | ICD-10-CM | POA: Diagnosis not present

## 2015-06-16 DIAGNOSIS — I129 Hypertensive chronic kidney disease with stage 1 through stage 4 chronic kidney disease, or unspecified chronic kidney disease: Secondary | ICD-10-CM | POA: Diagnosis not present

## 2015-06-16 DIAGNOSIS — M6281 Muscle weakness (generalized): Secondary | ICD-10-CM | POA: Diagnosis not present

## 2015-06-16 DIAGNOSIS — F411 Generalized anxiety disorder: Secondary | ICD-10-CM | POA: Diagnosis not present

## 2015-06-16 DIAGNOSIS — G309 Alzheimer's disease, unspecified: Secondary | ICD-10-CM | POA: Diagnosis not present

## 2015-06-16 DIAGNOSIS — F0281 Dementia in other diseases classified elsewhere with behavioral disturbance: Secondary | ICD-10-CM | POA: Diagnosis not present

## 2015-06-16 DIAGNOSIS — N183 Chronic kidney disease, stage 3 (moderate): Secondary | ICD-10-CM | POA: Diagnosis not present

## 2015-06-16 DIAGNOSIS — F329 Major depressive disorder, single episode, unspecified: Secondary | ICD-10-CM | POA: Diagnosis not present

## 2015-06-17 DIAGNOSIS — F411 Generalized anxiety disorder: Secondary | ICD-10-CM | POA: Diagnosis not present

## 2015-06-17 DIAGNOSIS — N183 Chronic kidney disease, stage 3 (moderate): Secondary | ICD-10-CM | POA: Diagnosis not present

## 2015-06-17 DIAGNOSIS — I129 Hypertensive chronic kidney disease with stage 1 through stage 4 chronic kidney disease, or unspecified chronic kidney disease: Secondary | ICD-10-CM | POA: Diagnosis not present

## 2015-06-17 DIAGNOSIS — F0281 Dementia in other diseases classified elsewhere with behavioral disturbance: Secondary | ICD-10-CM | POA: Diagnosis not present

## 2015-06-17 DIAGNOSIS — F329 Major depressive disorder, single episode, unspecified: Secondary | ICD-10-CM | POA: Diagnosis not present

## 2015-06-17 DIAGNOSIS — M6281 Muscle weakness (generalized): Secondary | ICD-10-CM | POA: Diagnosis not present

## 2015-06-17 DIAGNOSIS — G309 Alzheimer's disease, unspecified: Secondary | ICD-10-CM | POA: Diagnosis not present

## 2015-06-18 DIAGNOSIS — I129 Hypertensive chronic kidney disease with stage 1 through stage 4 chronic kidney disease, or unspecified chronic kidney disease: Secondary | ICD-10-CM | POA: Diagnosis not present

## 2015-06-18 DIAGNOSIS — G309 Alzheimer's disease, unspecified: Secondary | ICD-10-CM | POA: Diagnosis not present

## 2015-06-18 DIAGNOSIS — F329 Major depressive disorder, single episode, unspecified: Secondary | ICD-10-CM | POA: Diagnosis not present

## 2015-06-18 DIAGNOSIS — F411 Generalized anxiety disorder: Secondary | ICD-10-CM | POA: Diagnosis not present

## 2015-06-18 DIAGNOSIS — M6281 Muscle weakness (generalized): Secondary | ICD-10-CM | POA: Diagnosis not present

## 2015-06-18 DIAGNOSIS — F0281 Dementia in other diseases classified elsewhere with behavioral disturbance: Secondary | ICD-10-CM | POA: Diagnosis not present

## 2015-06-18 DIAGNOSIS — N183 Chronic kidney disease, stage 3 (moderate): Secondary | ICD-10-CM | POA: Diagnosis not present

## 2015-06-19 DIAGNOSIS — N183 Chronic kidney disease, stage 3 (moderate): Secondary | ICD-10-CM | POA: Diagnosis not present

## 2015-06-19 DIAGNOSIS — I129 Hypertensive chronic kidney disease with stage 1 through stage 4 chronic kidney disease, or unspecified chronic kidney disease: Secondary | ICD-10-CM | POA: Diagnosis not present

## 2015-06-19 DIAGNOSIS — G309 Alzheimer's disease, unspecified: Secondary | ICD-10-CM | POA: Diagnosis not present

## 2015-06-19 DIAGNOSIS — M6281 Muscle weakness (generalized): Secondary | ICD-10-CM | POA: Diagnosis not present

## 2015-06-19 DIAGNOSIS — F0281 Dementia in other diseases classified elsewhere with behavioral disturbance: Secondary | ICD-10-CM | POA: Diagnosis not present

## 2015-06-19 DIAGNOSIS — F411 Generalized anxiety disorder: Secondary | ICD-10-CM | POA: Diagnosis not present

## 2015-06-19 DIAGNOSIS — F329 Major depressive disorder, single episode, unspecified: Secondary | ICD-10-CM | POA: Diagnosis not present

## 2015-06-20 DIAGNOSIS — G309 Alzheimer's disease, unspecified: Secondary | ICD-10-CM | POA: Diagnosis not present

## 2015-06-21 DIAGNOSIS — G309 Alzheimer's disease, unspecified: Secondary | ICD-10-CM | POA: Diagnosis not present

## 2015-06-22 DIAGNOSIS — G309 Alzheimer's disease, unspecified: Secondary | ICD-10-CM | POA: Diagnosis not present

## 2015-06-23 DIAGNOSIS — F329 Major depressive disorder, single episode, unspecified: Secondary | ICD-10-CM | POA: Diagnosis not present

## 2015-06-23 DIAGNOSIS — F0281 Dementia in other diseases classified elsewhere with behavioral disturbance: Secondary | ICD-10-CM | POA: Diagnosis not present

## 2015-06-23 DIAGNOSIS — N183 Chronic kidney disease, stage 3 (moderate): Secondary | ICD-10-CM | POA: Diagnosis not present

## 2015-06-23 DIAGNOSIS — I129 Hypertensive chronic kidney disease with stage 1 through stage 4 chronic kidney disease, or unspecified chronic kidney disease: Secondary | ICD-10-CM | POA: Diagnosis not present

## 2015-06-23 DIAGNOSIS — G309 Alzheimer's disease, unspecified: Secondary | ICD-10-CM | POA: Diagnosis not present

## 2015-06-23 DIAGNOSIS — F411 Generalized anxiety disorder: Secondary | ICD-10-CM | POA: Diagnosis not present

## 2015-06-23 DIAGNOSIS — M6281 Muscle weakness (generalized): Secondary | ICD-10-CM | POA: Diagnosis not present

## 2015-06-24 DIAGNOSIS — F411 Generalized anxiety disorder: Secondary | ICD-10-CM | POA: Diagnosis not present

## 2015-06-24 DIAGNOSIS — M6281 Muscle weakness (generalized): Secondary | ICD-10-CM | POA: Diagnosis not present

## 2015-06-24 DIAGNOSIS — I129 Hypertensive chronic kidney disease with stage 1 through stage 4 chronic kidney disease, or unspecified chronic kidney disease: Secondary | ICD-10-CM | POA: Diagnosis not present

## 2015-06-24 DIAGNOSIS — F0281 Dementia in other diseases classified elsewhere with behavioral disturbance: Secondary | ICD-10-CM | POA: Diagnosis not present

## 2015-06-24 DIAGNOSIS — F329 Major depressive disorder, single episode, unspecified: Secondary | ICD-10-CM | POA: Diagnosis not present

## 2015-06-24 DIAGNOSIS — N183 Chronic kidney disease, stage 3 (moderate): Secondary | ICD-10-CM | POA: Diagnosis not present

## 2015-06-24 DIAGNOSIS — G309 Alzheimer's disease, unspecified: Secondary | ICD-10-CM | POA: Diagnosis not present

## 2015-06-25 DIAGNOSIS — G309 Alzheimer's disease, unspecified: Secondary | ICD-10-CM | POA: Diagnosis not present

## 2015-06-26 DIAGNOSIS — F329 Major depressive disorder, single episode, unspecified: Secondary | ICD-10-CM | POA: Diagnosis not present

## 2015-06-26 DIAGNOSIS — F411 Generalized anxiety disorder: Secondary | ICD-10-CM | POA: Diagnosis not present

## 2015-06-26 DIAGNOSIS — M6281 Muscle weakness (generalized): Secondary | ICD-10-CM | POA: Diagnosis not present

## 2015-06-26 DIAGNOSIS — G309 Alzheimer's disease, unspecified: Secondary | ICD-10-CM | POA: Diagnosis not present

## 2015-06-26 DIAGNOSIS — N183 Chronic kidney disease, stage 3 (moderate): Secondary | ICD-10-CM | POA: Diagnosis not present

## 2015-06-26 DIAGNOSIS — I129 Hypertensive chronic kidney disease with stage 1 through stage 4 chronic kidney disease, or unspecified chronic kidney disease: Secondary | ICD-10-CM | POA: Diagnosis not present

## 2015-06-26 DIAGNOSIS — F0281 Dementia in other diseases classified elsewhere with behavioral disturbance: Secondary | ICD-10-CM | POA: Diagnosis not present

## 2015-06-27 DIAGNOSIS — G309 Alzheimer's disease, unspecified: Secondary | ICD-10-CM | POA: Diagnosis not present

## 2015-06-28 DIAGNOSIS — G309 Alzheimer's disease, unspecified: Secondary | ICD-10-CM | POA: Diagnosis not present

## 2015-06-29 DIAGNOSIS — G309 Alzheimer's disease, unspecified: Secondary | ICD-10-CM | POA: Diagnosis not present

## 2015-06-30 DIAGNOSIS — G309 Alzheimer's disease, unspecified: Secondary | ICD-10-CM | POA: Diagnosis not present

## 2015-06-30 DIAGNOSIS — I129 Hypertensive chronic kidney disease with stage 1 through stage 4 chronic kidney disease, or unspecified chronic kidney disease: Secondary | ICD-10-CM | POA: Diagnosis not present

## 2015-06-30 DIAGNOSIS — F329 Major depressive disorder, single episode, unspecified: Secondary | ICD-10-CM | POA: Diagnosis not present

## 2015-06-30 DIAGNOSIS — F0281 Dementia in other diseases classified elsewhere with behavioral disturbance: Secondary | ICD-10-CM | POA: Diagnosis not present

## 2015-06-30 DIAGNOSIS — N183 Chronic kidney disease, stage 3 (moderate): Secondary | ICD-10-CM | POA: Diagnosis not present

## 2015-06-30 DIAGNOSIS — M6281 Muscle weakness (generalized): Secondary | ICD-10-CM | POA: Diagnosis not present

## 2015-06-30 DIAGNOSIS — F411 Generalized anxiety disorder: Secondary | ICD-10-CM | POA: Diagnosis not present

## 2015-07-01 DIAGNOSIS — F329 Major depressive disorder, single episode, unspecified: Secondary | ICD-10-CM | POA: Diagnosis not present

## 2015-07-01 DIAGNOSIS — M6281 Muscle weakness (generalized): Secondary | ICD-10-CM | POA: Diagnosis not present

## 2015-07-01 DIAGNOSIS — F411 Generalized anxiety disorder: Secondary | ICD-10-CM | POA: Diagnosis not present

## 2015-07-01 DIAGNOSIS — F0281 Dementia in other diseases classified elsewhere with behavioral disturbance: Secondary | ICD-10-CM | POA: Diagnosis not present

## 2015-07-01 DIAGNOSIS — G309 Alzheimer's disease, unspecified: Secondary | ICD-10-CM | POA: Diagnosis not present

## 2015-07-01 DIAGNOSIS — I129 Hypertensive chronic kidney disease with stage 1 through stage 4 chronic kidney disease, or unspecified chronic kidney disease: Secondary | ICD-10-CM | POA: Diagnosis not present

## 2015-07-01 DIAGNOSIS — N183 Chronic kidney disease, stage 3 (moderate): Secondary | ICD-10-CM | POA: Diagnosis not present

## 2015-07-02 DIAGNOSIS — F411 Generalized anxiety disorder: Secondary | ICD-10-CM | POA: Diagnosis not present

## 2015-07-02 DIAGNOSIS — I129 Hypertensive chronic kidney disease with stage 1 through stage 4 chronic kidney disease, or unspecified chronic kidney disease: Secondary | ICD-10-CM | POA: Diagnosis not present

## 2015-07-02 DIAGNOSIS — M6281 Muscle weakness (generalized): Secondary | ICD-10-CM | POA: Diagnosis not present

## 2015-07-02 DIAGNOSIS — F329 Major depressive disorder, single episode, unspecified: Secondary | ICD-10-CM | POA: Diagnosis not present

## 2015-07-02 DIAGNOSIS — G309 Alzheimer's disease, unspecified: Secondary | ICD-10-CM | POA: Diagnosis not present

## 2015-07-02 DIAGNOSIS — F0281 Dementia in other diseases classified elsewhere with behavioral disturbance: Secondary | ICD-10-CM | POA: Diagnosis not present

## 2015-07-02 DIAGNOSIS — N183 Chronic kidney disease, stage 3 (moderate): Secondary | ICD-10-CM | POA: Diagnosis not present

## 2015-07-03 DIAGNOSIS — G309 Alzheimer's disease, unspecified: Secondary | ICD-10-CM | POA: Diagnosis not present

## 2015-07-04 DIAGNOSIS — G309 Alzheimer's disease, unspecified: Secondary | ICD-10-CM | POA: Diagnosis not present

## 2015-07-04 DIAGNOSIS — I129 Hypertensive chronic kidney disease with stage 1 through stage 4 chronic kidney disease, or unspecified chronic kidney disease: Secondary | ICD-10-CM | POA: Diagnosis not present

## 2015-07-04 DIAGNOSIS — F0281 Dementia in other diseases classified elsewhere with behavioral disturbance: Secondary | ICD-10-CM | POA: Diagnosis not present

## 2015-07-04 DIAGNOSIS — N183 Chronic kidney disease, stage 3 (moderate): Secondary | ICD-10-CM | POA: Diagnosis not present

## 2015-07-04 DIAGNOSIS — F411 Generalized anxiety disorder: Secondary | ICD-10-CM | POA: Diagnosis not present

## 2015-07-04 DIAGNOSIS — F329 Major depressive disorder, single episode, unspecified: Secondary | ICD-10-CM | POA: Diagnosis not present

## 2015-07-04 DIAGNOSIS — M6281 Muscle weakness (generalized): Secondary | ICD-10-CM | POA: Diagnosis not present

## 2015-07-05 DIAGNOSIS — G309 Alzheimer's disease, unspecified: Secondary | ICD-10-CM | POA: Diagnosis not present

## 2015-07-06 DIAGNOSIS — G309 Alzheimer's disease, unspecified: Secondary | ICD-10-CM | POA: Diagnosis not present

## 2015-07-07 DIAGNOSIS — G309 Alzheimer's disease, unspecified: Secondary | ICD-10-CM | POA: Diagnosis not present

## 2015-07-07 DIAGNOSIS — I129 Hypertensive chronic kidney disease with stage 1 through stage 4 chronic kidney disease, or unspecified chronic kidney disease: Secondary | ICD-10-CM | POA: Diagnosis not present

## 2015-07-07 DIAGNOSIS — M6281 Muscle weakness (generalized): Secondary | ICD-10-CM | POA: Diagnosis not present

## 2015-07-07 DIAGNOSIS — N183 Chronic kidney disease, stage 3 (moderate): Secondary | ICD-10-CM | POA: Diagnosis not present

## 2015-07-07 DIAGNOSIS — F0281 Dementia in other diseases classified elsewhere with behavioral disturbance: Secondary | ICD-10-CM | POA: Diagnosis not present

## 2015-07-07 DIAGNOSIS — F411 Generalized anxiety disorder: Secondary | ICD-10-CM | POA: Diagnosis not present

## 2015-07-07 DIAGNOSIS — F329 Major depressive disorder, single episode, unspecified: Secondary | ICD-10-CM | POA: Diagnosis not present

## 2015-07-08 DIAGNOSIS — G309 Alzheimer's disease, unspecified: Secondary | ICD-10-CM | POA: Diagnosis not present

## 2015-07-09 DIAGNOSIS — F0281 Dementia in other diseases classified elsewhere with behavioral disturbance: Secondary | ICD-10-CM | POA: Diagnosis not present

## 2015-07-09 DIAGNOSIS — N183 Chronic kidney disease, stage 3 (moderate): Secondary | ICD-10-CM | POA: Diagnosis not present

## 2015-07-09 DIAGNOSIS — I129 Hypertensive chronic kidney disease with stage 1 through stage 4 chronic kidney disease, or unspecified chronic kidney disease: Secondary | ICD-10-CM | POA: Diagnosis not present

## 2015-07-09 DIAGNOSIS — F411 Generalized anxiety disorder: Secondary | ICD-10-CM | POA: Diagnosis not present

## 2015-07-09 DIAGNOSIS — G309 Alzheimer's disease, unspecified: Secondary | ICD-10-CM | POA: Diagnosis not present

## 2015-07-09 DIAGNOSIS — F329 Major depressive disorder, single episode, unspecified: Secondary | ICD-10-CM | POA: Diagnosis not present

## 2015-07-09 DIAGNOSIS — M6281 Muscle weakness (generalized): Secondary | ICD-10-CM | POA: Diagnosis not present

## 2015-07-10 DIAGNOSIS — G309 Alzheimer's disease, unspecified: Secondary | ICD-10-CM | POA: Diagnosis not present

## 2015-07-11 DIAGNOSIS — R451 Restlessness and agitation: Secondary | ICD-10-CM | POA: Diagnosis not present

## 2015-07-11 DIAGNOSIS — G309 Alzheimer's disease, unspecified: Secondary | ICD-10-CM | POA: Diagnosis not present

## 2015-07-11 DIAGNOSIS — F411 Generalized anxiety disorder: Secondary | ICD-10-CM | POA: Diagnosis not present

## 2015-07-11 DIAGNOSIS — F0391 Unspecified dementia with behavioral disturbance: Secondary | ICD-10-CM | POA: Diagnosis not present

## 2015-07-12 DIAGNOSIS — G309 Alzheimer's disease, unspecified: Secondary | ICD-10-CM | POA: Diagnosis not present

## 2015-07-13 DIAGNOSIS — G309 Alzheimer's disease, unspecified: Secondary | ICD-10-CM | POA: Diagnosis not present

## 2015-07-14 DIAGNOSIS — G309 Alzheimer's disease, unspecified: Secondary | ICD-10-CM | POA: Diagnosis not present

## 2015-07-15 DIAGNOSIS — G309 Alzheimer's disease, unspecified: Secondary | ICD-10-CM | POA: Diagnosis not present

## 2015-07-16 DIAGNOSIS — G309 Alzheimer's disease, unspecified: Secondary | ICD-10-CM | POA: Diagnosis not present

## 2015-07-17 DIAGNOSIS — I10 Essential (primary) hypertension: Secondary | ICD-10-CM | POA: Diagnosis not present

## 2015-07-17 DIAGNOSIS — F0390 Unspecified dementia without behavioral disturbance: Secondary | ICD-10-CM | POA: Diagnosis not present

## 2015-07-17 DIAGNOSIS — G309 Alzheimer's disease, unspecified: Secondary | ICD-10-CM | POA: Diagnosis not present

## 2015-07-17 DIAGNOSIS — N189 Chronic kidney disease, unspecified: Secondary | ICD-10-CM | POA: Diagnosis not present

## 2015-07-17 DIAGNOSIS — J45909 Unspecified asthma, uncomplicated: Secondary | ICD-10-CM | POA: Diagnosis not present

## 2015-07-18 DIAGNOSIS — G309 Alzheimer's disease, unspecified: Secondary | ICD-10-CM | POA: Diagnosis not present

## 2015-07-19 DIAGNOSIS — G309 Alzheimer's disease, unspecified: Secondary | ICD-10-CM | POA: Diagnosis not present

## 2015-07-20 DIAGNOSIS — G309 Alzheimer's disease, unspecified: Secondary | ICD-10-CM | POA: Diagnosis not present

## 2015-07-21 DIAGNOSIS — G309 Alzheimer's disease, unspecified: Secondary | ICD-10-CM | POA: Diagnosis not present

## 2015-07-22 DIAGNOSIS — G309 Alzheimer's disease, unspecified: Secondary | ICD-10-CM | POA: Diagnosis not present

## 2015-07-23 DIAGNOSIS — G309 Alzheimer's disease, unspecified: Secondary | ICD-10-CM | POA: Diagnosis not present

## 2015-07-24 DIAGNOSIS — G309 Alzheimer's disease, unspecified: Secondary | ICD-10-CM | POA: Diagnosis not present

## 2015-07-25 DIAGNOSIS — G309 Alzheimer's disease, unspecified: Secondary | ICD-10-CM | POA: Diagnosis not present

## 2015-07-26 DIAGNOSIS — G309 Alzheimer's disease, unspecified: Secondary | ICD-10-CM | POA: Diagnosis not present

## 2015-07-27 DIAGNOSIS — G309 Alzheimer's disease, unspecified: Secondary | ICD-10-CM | POA: Diagnosis not present

## 2015-07-28 DIAGNOSIS — G309 Alzheimer's disease, unspecified: Secondary | ICD-10-CM | POA: Diagnosis not present

## 2015-07-29 DIAGNOSIS — G309 Alzheimer's disease, unspecified: Secondary | ICD-10-CM | POA: Diagnosis not present

## 2015-07-30 DIAGNOSIS — G309 Alzheimer's disease, unspecified: Secondary | ICD-10-CM | POA: Diagnosis not present

## 2015-07-31 DIAGNOSIS — G309 Alzheimer's disease, unspecified: Secondary | ICD-10-CM | POA: Diagnosis not present

## 2015-08-01 DIAGNOSIS — G309 Alzheimer's disease, unspecified: Secondary | ICD-10-CM | POA: Diagnosis not present

## 2015-08-02 DIAGNOSIS — G309 Alzheimer's disease, unspecified: Secondary | ICD-10-CM | POA: Diagnosis not present

## 2015-08-03 DIAGNOSIS — G309 Alzheimer's disease, unspecified: Secondary | ICD-10-CM | POA: Diagnosis not present

## 2015-08-04 DIAGNOSIS — R451 Restlessness and agitation: Secondary | ICD-10-CM | POA: Diagnosis not present

## 2015-08-04 DIAGNOSIS — F411 Generalized anxiety disorder: Secondary | ICD-10-CM | POA: Diagnosis not present

## 2015-08-04 DIAGNOSIS — G309 Alzheimer's disease, unspecified: Secondary | ICD-10-CM | POA: Diagnosis not present

## 2015-08-04 DIAGNOSIS — F0391 Unspecified dementia with behavioral disturbance: Secondary | ICD-10-CM | POA: Diagnosis not present

## 2015-08-05 DIAGNOSIS — R32 Unspecified urinary incontinence: Secondary | ICD-10-CM | POA: Diagnosis not present

## 2015-08-05 DIAGNOSIS — G309 Alzheimer's disease, unspecified: Secondary | ICD-10-CM | POA: Diagnosis not present

## 2015-08-06 DIAGNOSIS — G309 Alzheimer's disease, unspecified: Secondary | ICD-10-CM | POA: Diagnosis not present

## 2015-08-07 DIAGNOSIS — G309 Alzheimer's disease, unspecified: Secondary | ICD-10-CM | POA: Diagnosis not present

## 2015-08-08 DIAGNOSIS — G309 Alzheimer's disease, unspecified: Secondary | ICD-10-CM | POA: Diagnosis not present

## 2015-08-09 DIAGNOSIS — G309 Alzheimer's disease, unspecified: Secondary | ICD-10-CM | POA: Diagnosis not present

## 2015-08-10 DIAGNOSIS — G309 Alzheimer's disease, unspecified: Secondary | ICD-10-CM | POA: Diagnosis not present

## 2015-08-11 DIAGNOSIS — G309 Alzheimer's disease, unspecified: Secondary | ICD-10-CM | POA: Diagnosis not present

## 2015-08-12 DIAGNOSIS — G309 Alzheimer's disease, unspecified: Secondary | ICD-10-CM | POA: Diagnosis not present

## 2015-08-13 DIAGNOSIS — G309 Alzheimer's disease, unspecified: Secondary | ICD-10-CM | POA: Diagnosis not present

## 2015-08-14 DIAGNOSIS — G309 Alzheimer's disease, unspecified: Secondary | ICD-10-CM | POA: Diagnosis not present

## 2015-08-15 DIAGNOSIS — G309 Alzheimer's disease, unspecified: Secondary | ICD-10-CM | POA: Diagnosis not present

## 2015-08-16 DIAGNOSIS — G309 Alzheimer's disease, unspecified: Secondary | ICD-10-CM | POA: Diagnosis not present

## 2015-08-17 DIAGNOSIS — G309 Alzheimer's disease, unspecified: Secondary | ICD-10-CM | POA: Diagnosis not present

## 2015-08-18 DIAGNOSIS — G309 Alzheimer's disease, unspecified: Secondary | ICD-10-CM | POA: Diagnosis not present

## 2015-08-19 DIAGNOSIS — G309 Alzheimer's disease, unspecified: Secondary | ICD-10-CM | POA: Diagnosis not present

## 2015-08-20 DIAGNOSIS — F0391 Unspecified dementia with behavioral disturbance: Secondary | ICD-10-CM | POA: Diagnosis not present

## 2015-08-20 DIAGNOSIS — R451 Restlessness and agitation: Secondary | ICD-10-CM | POA: Diagnosis not present

## 2015-08-20 DIAGNOSIS — F0630 Mood disorder due to known physiological condition, unspecified: Secondary | ICD-10-CM | POA: Diagnosis not present

## 2015-08-20 DIAGNOSIS — F411 Generalized anxiety disorder: Secondary | ICD-10-CM | POA: Diagnosis not present

## 2015-08-20 DIAGNOSIS — G309 Alzheimer's disease, unspecified: Secondary | ICD-10-CM | POA: Diagnosis not present

## 2015-08-21 DIAGNOSIS — G309 Alzheimer's disease, unspecified: Secondary | ICD-10-CM | POA: Diagnosis not present

## 2015-08-22 DIAGNOSIS — G309 Alzheimer's disease, unspecified: Secondary | ICD-10-CM | POA: Diagnosis not present

## 2015-08-23 DIAGNOSIS — G309 Alzheimer's disease, unspecified: Secondary | ICD-10-CM | POA: Diagnosis not present

## 2015-08-24 DIAGNOSIS — G309 Alzheimer's disease, unspecified: Secondary | ICD-10-CM | POA: Diagnosis not present

## 2015-08-25 DIAGNOSIS — G309 Alzheimer's disease, unspecified: Secondary | ICD-10-CM | POA: Diagnosis not present

## 2015-08-26 DIAGNOSIS — G309 Alzheimer's disease, unspecified: Secondary | ICD-10-CM | POA: Diagnosis not present

## 2015-08-27 DIAGNOSIS — G309 Alzheimer's disease, unspecified: Secondary | ICD-10-CM | POA: Diagnosis not present

## 2015-08-28 DIAGNOSIS — G309 Alzheimer's disease, unspecified: Secondary | ICD-10-CM | POA: Diagnosis not present

## 2015-08-29 DIAGNOSIS — G309 Alzheimer's disease, unspecified: Secondary | ICD-10-CM | POA: Diagnosis not present

## 2015-08-30 DIAGNOSIS — G309 Alzheimer's disease, unspecified: Secondary | ICD-10-CM | POA: Diagnosis not present

## 2015-08-31 DIAGNOSIS — G309 Alzheimer's disease, unspecified: Secondary | ICD-10-CM | POA: Diagnosis not present

## 2015-09-01 DIAGNOSIS — F0391 Unspecified dementia with behavioral disturbance: Secondary | ICD-10-CM | POA: Diagnosis not present

## 2015-09-01 DIAGNOSIS — F0630 Mood disorder due to known physiological condition, unspecified: Secondary | ICD-10-CM | POA: Diagnosis not present

## 2015-09-01 DIAGNOSIS — R451 Restlessness and agitation: Secondary | ICD-10-CM | POA: Diagnosis not present

## 2015-09-01 DIAGNOSIS — G309 Alzheimer's disease, unspecified: Secondary | ICD-10-CM | POA: Diagnosis not present

## 2015-09-01 DIAGNOSIS — F411 Generalized anxiety disorder: Secondary | ICD-10-CM | POA: Diagnosis not present

## 2015-09-02 DIAGNOSIS — G309 Alzheimer's disease, unspecified: Secondary | ICD-10-CM | POA: Diagnosis not present

## 2015-09-03 DIAGNOSIS — G309 Alzheimer's disease, unspecified: Secondary | ICD-10-CM | POA: Diagnosis not present

## 2015-09-04 DIAGNOSIS — N183 Chronic kidney disease, stage 3 (moderate): Secondary | ICD-10-CM | POA: Diagnosis not present

## 2015-09-04 DIAGNOSIS — I10 Essential (primary) hypertension: Secondary | ICD-10-CM | POA: Diagnosis not present

## 2015-09-04 DIAGNOSIS — K5909 Other constipation: Secondary | ICD-10-CM | POA: Diagnosis not present

## 2015-09-04 DIAGNOSIS — G309 Alzheimer's disease, unspecified: Secondary | ICD-10-CM | POA: Diagnosis not present

## 2015-09-05 DIAGNOSIS — G309 Alzheimer's disease, unspecified: Secondary | ICD-10-CM | POA: Diagnosis not present

## 2015-09-05 DIAGNOSIS — M79671 Pain in right foot: Secondary | ICD-10-CM | POA: Diagnosis not present

## 2015-09-05 DIAGNOSIS — B351 Tinea unguium: Secondary | ICD-10-CM | POA: Diagnosis not present

## 2015-09-05 DIAGNOSIS — M79672 Pain in left foot: Secondary | ICD-10-CM | POA: Diagnosis not present

## 2015-09-06 DIAGNOSIS — G309 Alzheimer's disease, unspecified: Secondary | ICD-10-CM | POA: Diagnosis not present

## 2015-09-07 DIAGNOSIS — G309 Alzheimer's disease, unspecified: Secondary | ICD-10-CM | POA: Diagnosis not present

## 2015-09-08 DIAGNOSIS — G309 Alzheimer's disease, unspecified: Secondary | ICD-10-CM | POA: Diagnosis not present

## 2015-09-09 DIAGNOSIS — R32 Unspecified urinary incontinence: Secondary | ICD-10-CM | POA: Diagnosis not present

## 2015-09-09 DIAGNOSIS — G309 Alzheimer's disease, unspecified: Secondary | ICD-10-CM | POA: Diagnosis not present

## 2015-09-10 DIAGNOSIS — G309 Alzheimer's disease, unspecified: Secondary | ICD-10-CM | POA: Diagnosis not present

## 2015-09-11 DIAGNOSIS — G309 Alzheimer's disease, unspecified: Secondary | ICD-10-CM | POA: Diagnosis not present

## 2015-09-12 DIAGNOSIS — G309 Alzheimer's disease, unspecified: Secondary | ICD-10-CM | POA: Diagnosis not present

## 2015-09-13 DIAGNOSIS — G309 Alzheimer's disease, unspecified: Secondary | ICD-10-CM | POA: Diagnosis not present

## 2015-09-14 DIAGNOSIS — G309 Alzheimer's disease, unspecified: Secondary | ICD-10-CM | POA: Diagnosis not present

## 2015-09-15 DIAGNOSIS — G309 Alzheimer's disease, unspecified: Secondary | ICD-10-CM | POA: Diagnosis not present

## 2015-09-16 DIAGNOSIS — E782 Mixed hyperlipidemia: Secondary | ICD-10-CM | POA: Diagnosis not present

## 2015-09-16 DIAGNOSIS — E119 Type 2 diabetes mellitus without complications: Secondary | ICD-10-CM | POA: Diagnosis not present

## 2015-09-16 DIAGNOSIS — E559 Vitamin D deficiency, unspecified: Secondary | ICD-10-CM | POA: Diagnosis not present

## 2015-09-16 DIAGNOSIS — Z79899 Other long term (current) drug therapy: Secondary | ICD-10-CM | POA: Diagnosis not present

## 2015-09-16 DIAGNOSIS — G309 Alzheimer's disease, unspecified: Secondary | ICD-10-CM | POA: Diagnosis not present

## 2015-09-16 DIAGNOSIS — E038 Other specified hypothyroidism: Secondary | ICD-10-CM | POA: Diagnosis not present

## 2015-09-17 DIAGNOSIS — G309 Alzheimer's disease, unspecified: Secondary | ICD-10-CM | POA: Diagnosis not present

## 2015-09-18 DIAGNOSIS — G309 Alzheimer's disease, unspecified: Secondary | ICD-10-CM | POA: Diagnosis not present

## 2015-09-19 DIAGNOSIS — G309 Alzheimer's disease, unspecified: Secondary | ICD-10-CM | POA: Diagnosis not present

## 2015-09-20 DIAGNOSIS — G309 Alzheimer's disease, unspecified: Secondary | ICD-10-CM | POA: Diagnosis not present

## 2015-09-21 DIAGNOSIS — G309 Alzheimer's disease, unspecified: Secondary | ICD-10-CM | POA: Diagnosis not present

## 2015-09-22 DIAGNOSIS — G309 Alzheimer's disease, unspecified: Secondary | ICD-10-CM | POA: Diagnosis not present

## 2015-09-23 DIAGNOSIS — G309 Alzheimer's disease, unspecified: Secondary | ICD-10-CM | POA: Diagnosis not present

## 2015-09-24 DIAGNOSIS — G309 Alzheimer's disease, unspecified: Secondary | ICD-10-CM | POA: Diagnosis not present

## 2015-09-25 DIAGNOSIS — G309 Alzheimer's disease, unspecified: Secondary | ICD-10-CM | POA: Diagnosis not present

## 2015-09-26 DIAGNOSIS — G309 Alzheimer's disease, unspecified: Secondary | ICD-10-CM | POA: Diagnosis not present

## 2015-09-27 DIAGNOSIS — G309 Alzheimer's disease, unspecified: Secondary | ICD-10-CM | POA: Diagnosis not present

## 2015-09-28 DIAGNOSIS — G309 Alzheimer's disease, unspecified: Secondary | ICD-10-CM | POA: Diagnosis not present

## 2015-09-29 DIAGNOSIS — G309 Alzheimer's disease, unspecified: Secondary | ICD-10-CM | POA: Diagnosis not present

## 2015-09-30 DIAGNOSIS — G309 Alzheimer's disease, unspecified: Secondary | ICD-10-CM | POA: Diagnosis not present

## 2015-10-01 DIAGNOSIS — G309 Alzheimer's disease, unspecified: Secondary | ICD-10-CM | POA: Diagnosis not present

## 2015-10-02 DIAGNOSIS — N183 Chronic kidney disease, stage 3 (moderate): Secondary | ICD-10-CM | POA: Diagnosis not present

## 2015-10-02 DIAGNOSIS — F0280 Dementia in other diseases classified elsewhere without behavioral disturbance: Secondary | ICD-10-CM | POA: Diagnosis not present

## 2015-10-02 DIAGNOSIS — G309 Alzheimer's disease, unspecified: Secondary | ICD-10-CM | POA: Diagnosis not present

## 2015-10-02 DIAGNOSIS — E784 Other hyperlipidemia: Secondary | ICD-10-CM | POA: Diagnosis not present

## 2015-10-02 DIAGNOSIS — I10 Essential (primary) hypertension: Secondary | ICD-10-CM | POA: Diagnosis not present

## 2015-10-03 DIAGNOSIS — G309 Alzheimer's disease, unspecified: Secondary | ICD-10-CM | POA: Diagnosis not present

## 2015-10-04 DIAGNOSIS — G309 Alzheimer's disease, unspecified: Secondary | ICD-10-CM | POA: Diagnosis not present

## 2015-10-05 DIAGNOSIS — G309 Alzheimer's disease, unspecified: Secondary | ICD-10-CM | POA: Diagnosis not present

## 2015-10-06 DIAGNOSIS — G309 Alzheimer's disease, unspecified: Secondary | ICD-10-CM | POA: Diagnosis not present

## 2015-10-06 DIAGNOSIS — F411 Generalized anxiety disorder: Secondary | ICD-10-CM | POA: Diagnosis not present

## 2015-10-06 DIAGNOSIS — F0391 Unspecified dementia with behavioral disturbance: Secondary | ICD-10-CM | POA: Diagnosis not present

## 2015-10-06 DIAGNOSIS — R451 Restlessness and agitation: Secondary | ICD-10-CM | POA: Diagnosis not present

## 2015-10-06 DIAGNOSIS — F0630 Mood disorder due to known physiological condition, unspecified: Secondary | ICD-10-CM | POA: Diagnosis not present

## 2015-10-07 DIAGNOSIS — G309 Alzheimer's disease, unspecified: Secondary | ICD-10-CM | POA: Diagnosis not present

## 2015-10-08 DIAGNOSIS — G309 Alzheimer's disease, unspecified: Secondary | ICD-10-CM | POA: Diagnosis not present

## 2015-10-09 DIAGNOSIS — G309 Alzheimer's disease, unspecified: Secondary | ICD-10-CM | POA: Diagnosis not present

## 2015-10-10 DIAGNOSIS — R32 Unspecified urinary incontinence: Secondary | ICD-10-CM | POA: Diagnosis not present

## 2015-10-10 DIAGNOSIS — G309 Alzheimer's disease, unspecified: Secondary | ICD-10-CM | POA: Diagnosis not present

## 2015-10-11 DIAGNOSIS — G309 Alzheimer's disease, unspecified: Secondary | ICD-10-CM | POA: Diagnosis not present

## 2015-10-12 DIAGNOSIS — G309 Alzheimer's disease, unspecified: Secondary | ICD-10-CM | POA: Diagnosis not present

## 2015-10-13 DIAGNOSIS — G309 Alzheimer's disease, unspecified: Secondary | ICD-10-CM | POA: Diagnosis not present

## 2015-10-14 DIAGNOSIS — G309 Alzheimer's disease, unspecified: Secondary | ICD-10-CM | POA: Diagnosis not present

## 2015-10-15 DIAGNOSIS — G309 Alzheimer's disease, unspecified: Secondary | ICD-10-CM | POA: Diagnosis not present

## 2015-10-16 DIAGNOSIS — G309 Alzheimer's disease, unspecified: Secondary | ICD-10-CM | POA: Diagnosis not present

## 2015-10-17 DIAGNOSIS — G309 Alzheimer's disease, unspecified: Secondary | ICD-10-CM | POA: Diagnosis not present

## 2015-10-18 DIAGNOSIS — G309 Alzheimer's disease, unspecified: Secondary | ICD-10-CM | POA: Diagnosis not present

## 2015-10-19 DIAGNOSIS — G309 Alzheimer's disease, unspecified: Secondary | ICD-10-CM | POA: Diagnosis not present

## 2015-10-20 DIAGNOSIS — G309 Alzheimer's disease, unspecified: Secondary | ICD-10-CM | POA: Diagnosis not present

## 2015-10-21 DIAGNOSIS — G309 Alzheimer's disease, unspecified: Secondary | ICD-10-CM | POA: Diagnosis not present

## 2015-10-22 DIAGNOSIS — G309 Alzheimer's disease, unspecified: Secondary | ICD-10-CM | POA: Diagnosis not present

## 2015-10-22 IMAGING — DX DG HIP (WITH OR WITHOUT PELVIS) 2-3V*R*
3 series · 3 of 3 positions shown · non-contrast
Comparison: No priors.

CLINICAL DATA: 84-year-old female with history of trauma from a
fall complaining of right-sided hip pain.

EXAM:
DG HIP W/ PELVIS 2-3V*R*

[pelvis ap]
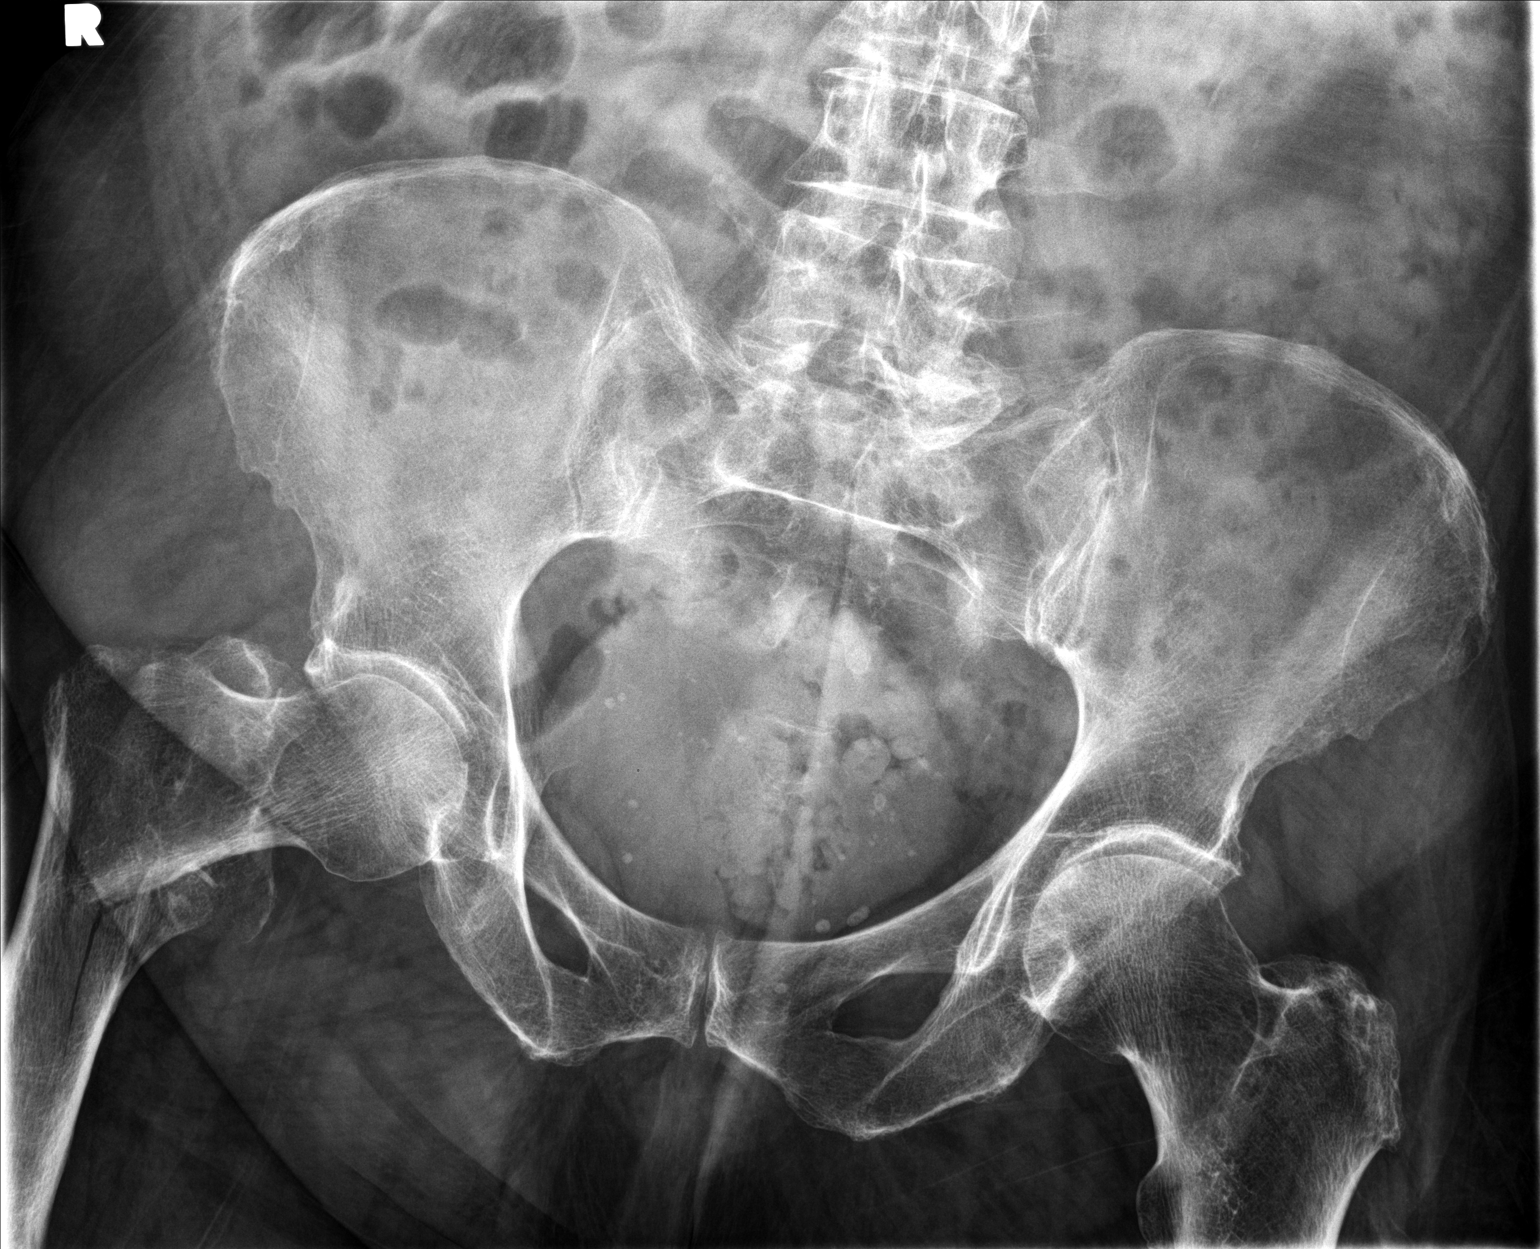

[hip ap]
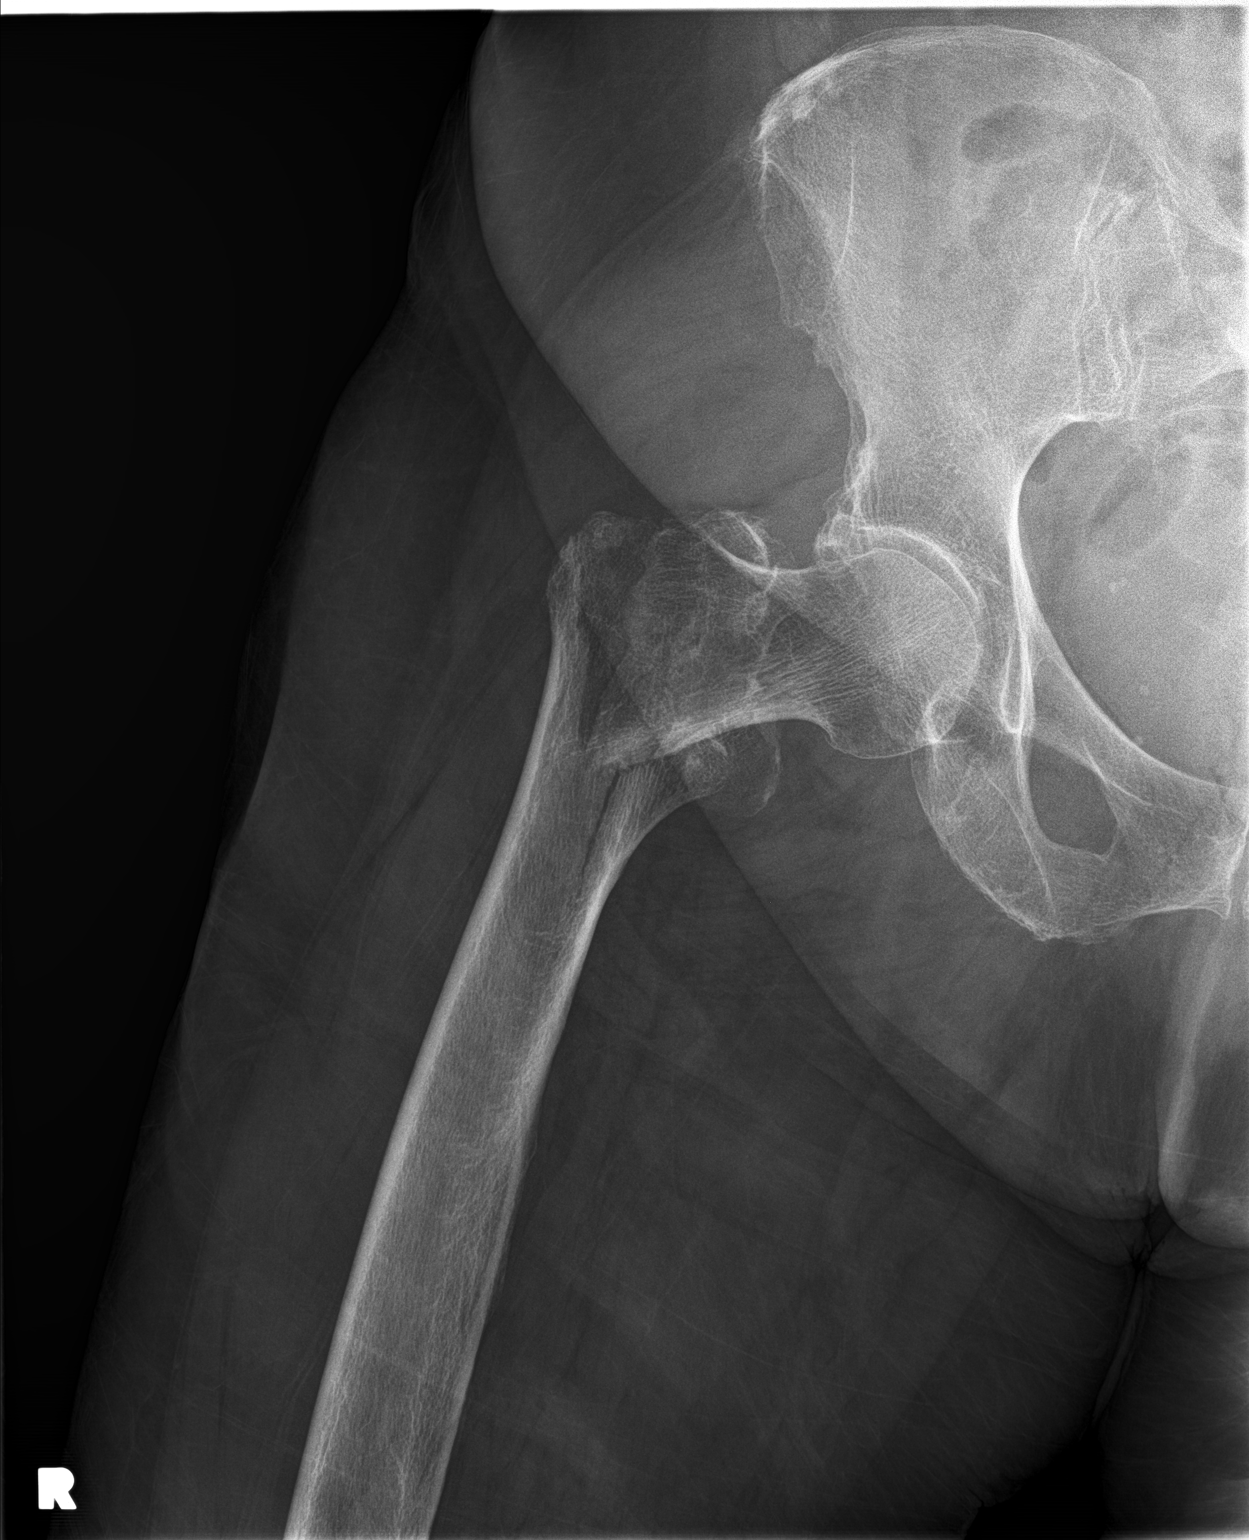

[hip lat]
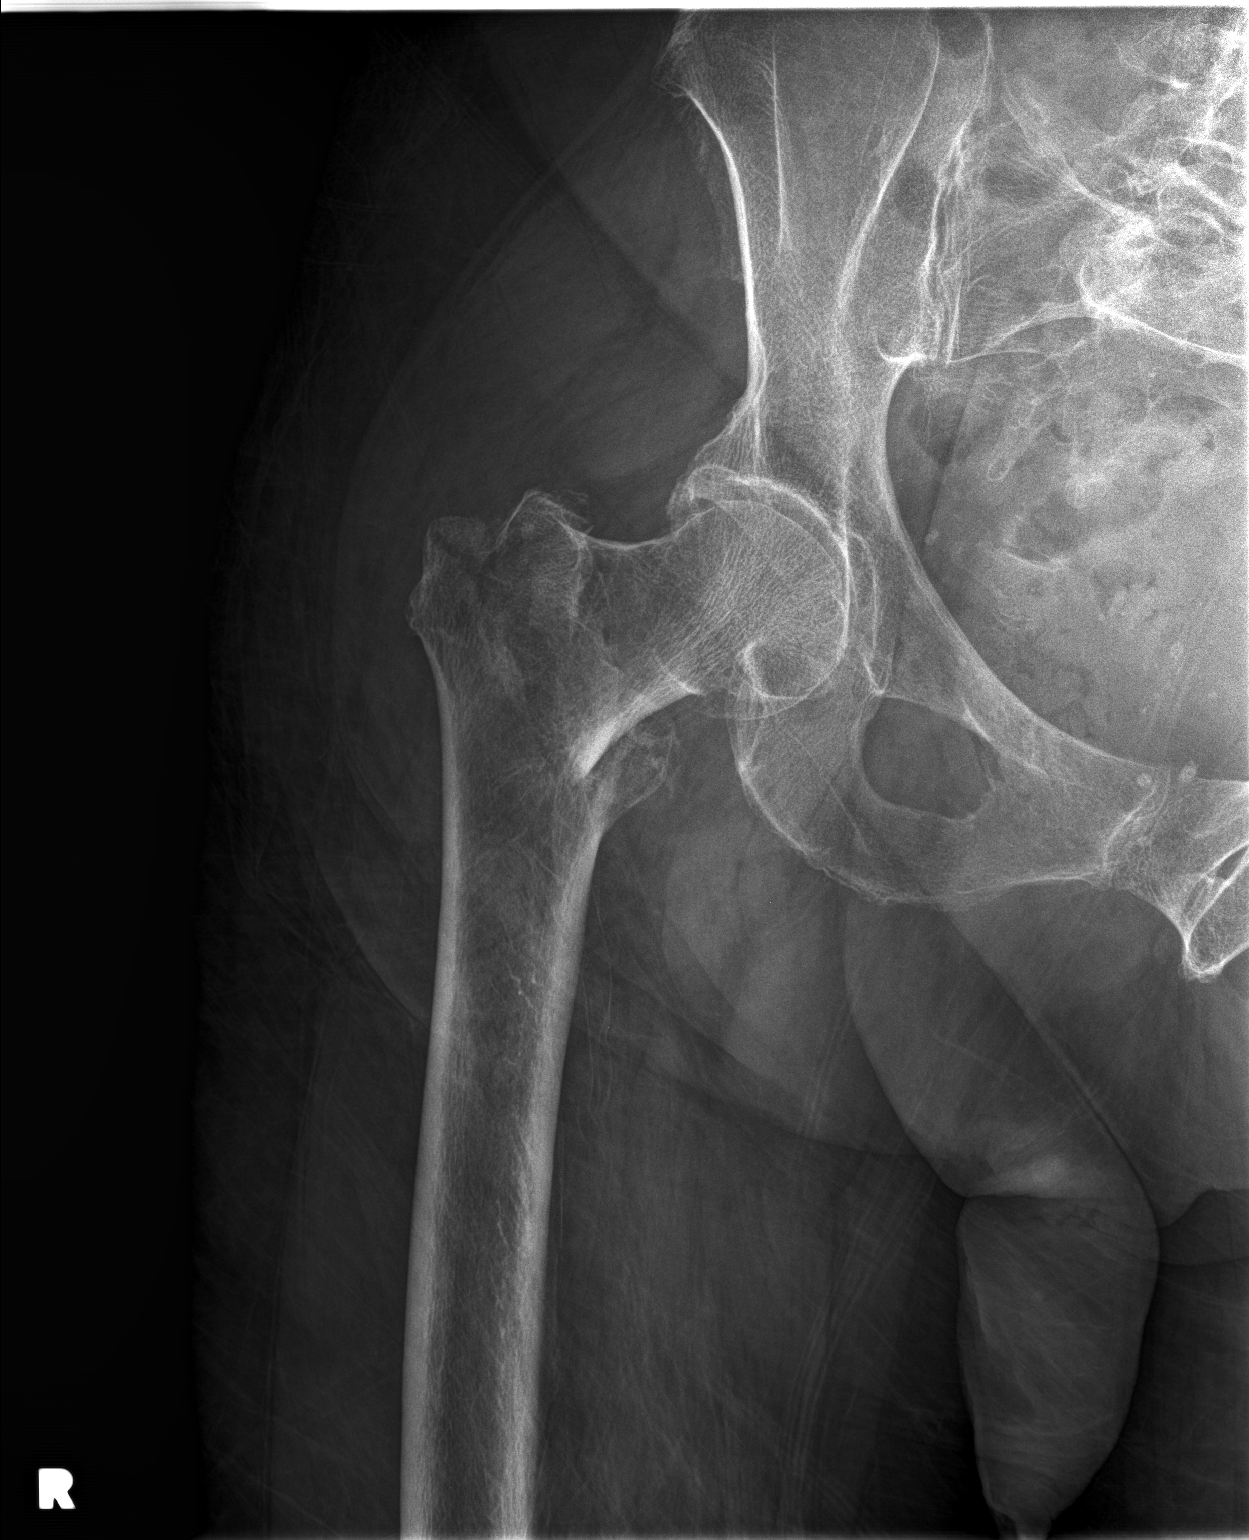

[3 of 3 positions shown; findings below may reference images not displayed]

FINDINGS: AP view of the pelvis demonstrates no acute displaced fracture of
the bony pelvic ring. AP and lateral views of the right hip
demonstrate a mildly comminuted intertrochanteric hip fracture, mild
varus rotation (approximately 10-15 degrees) and some foreshortening
with proximal displacement of the distal fracture fragments. The
femoral head remains located.
IMPRESSION: 1. Comminuted mildly displaced and angulated intertrochanteric
fracture of the right hip, as above.

## 2015-10-23 DIAGNOSIS — G309 Alzheimer's disease, unspecified: Secondary | ICD-10-CM | POA: Diagnosis not present

## 2015-10-23 IMAGING — RF DG HIP (WITH PELVIS) OPERATIVE*R*
1 series · 6 of 6 positions shown · non-contrast
Comparison: 02/22/2014.

CLINICAL DATA: 84-year-old female with history of intertrochanteric
right hip fracture. ORIF.

EXAM:
OPERATIVE RIGHT HIP WITH PELVIS

[Series 1: run · 6 of 6 slices shown]
[im 1/6]
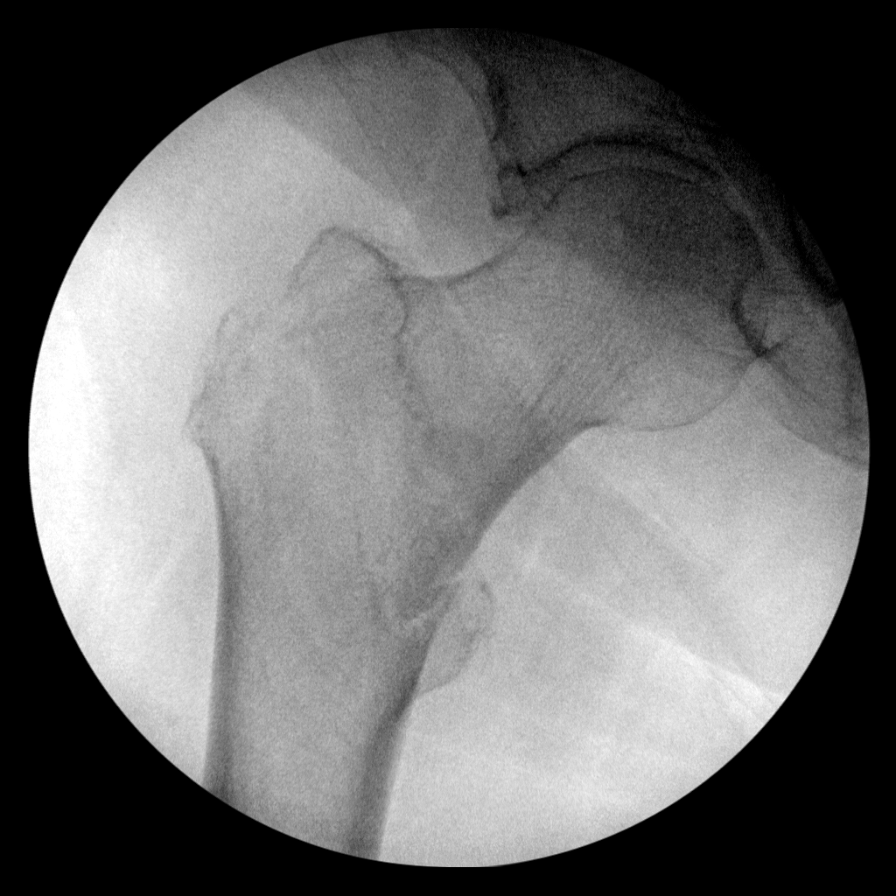
[im 2/6]
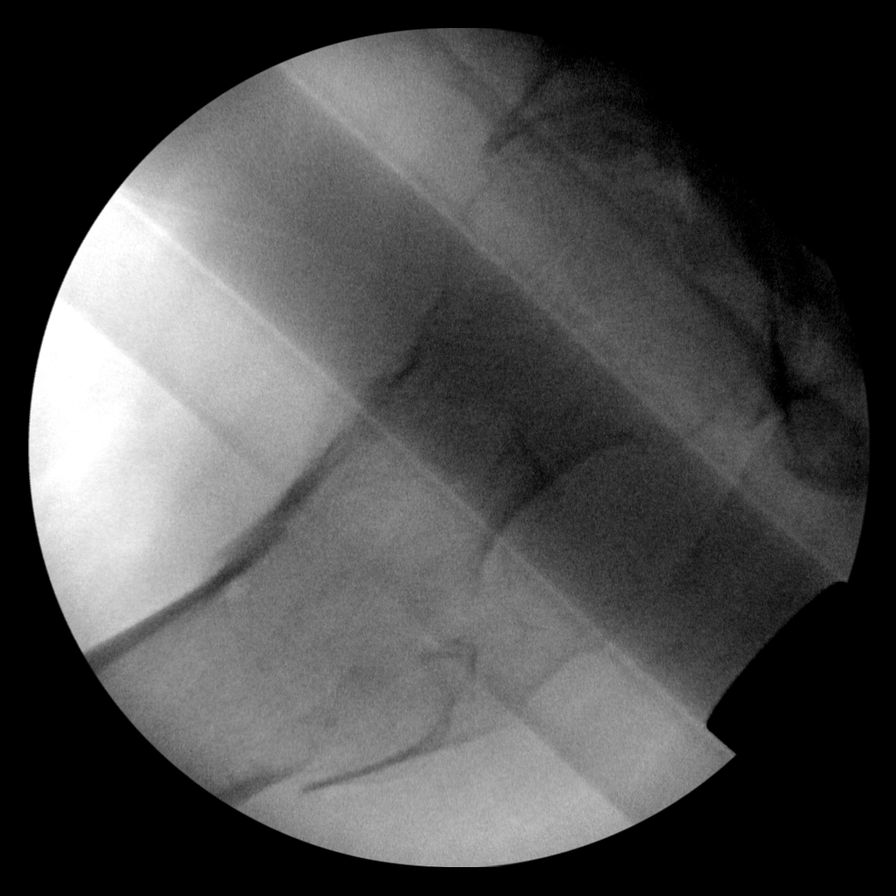
[im 3/6]
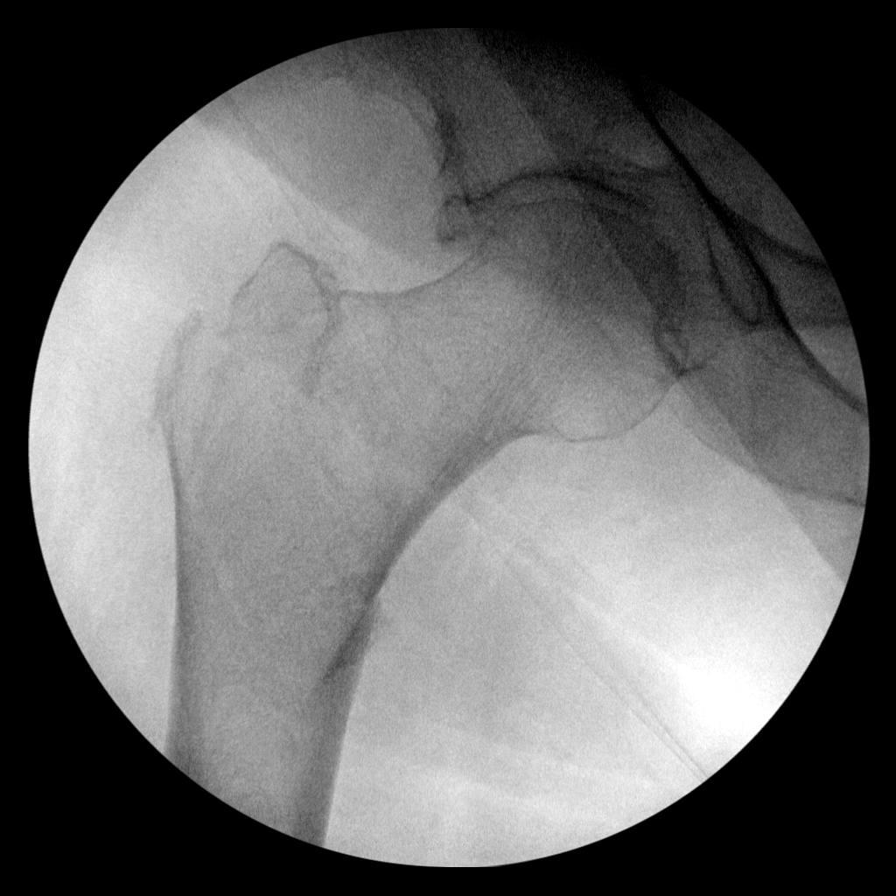
[im 4/6]
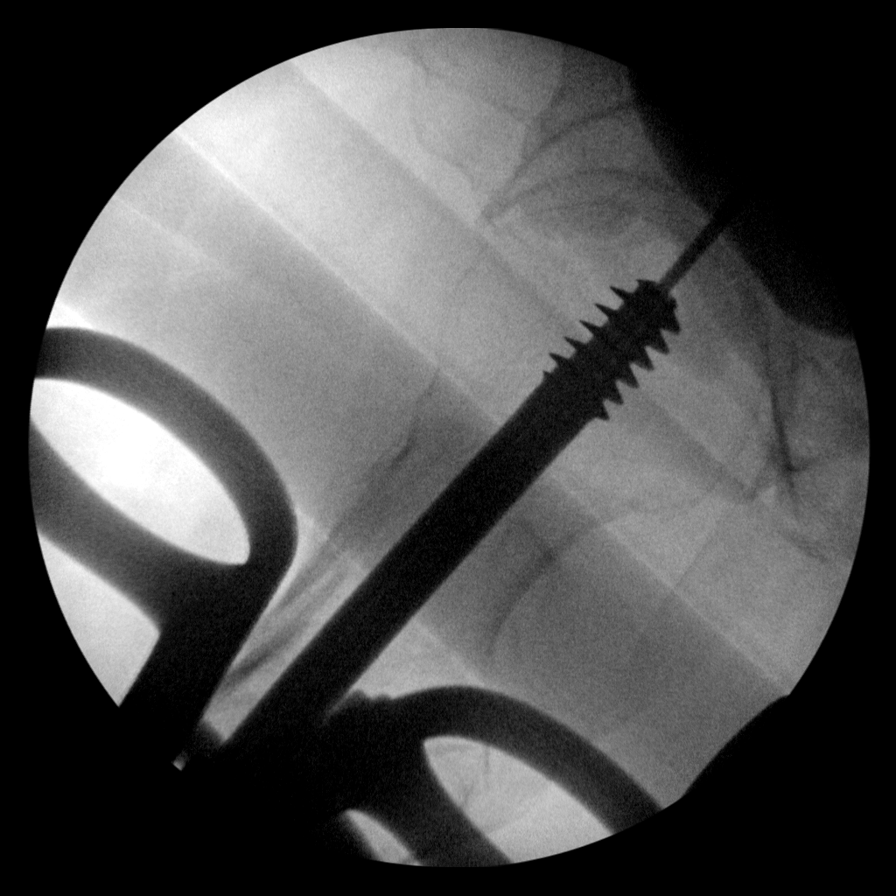
[im 5/6]
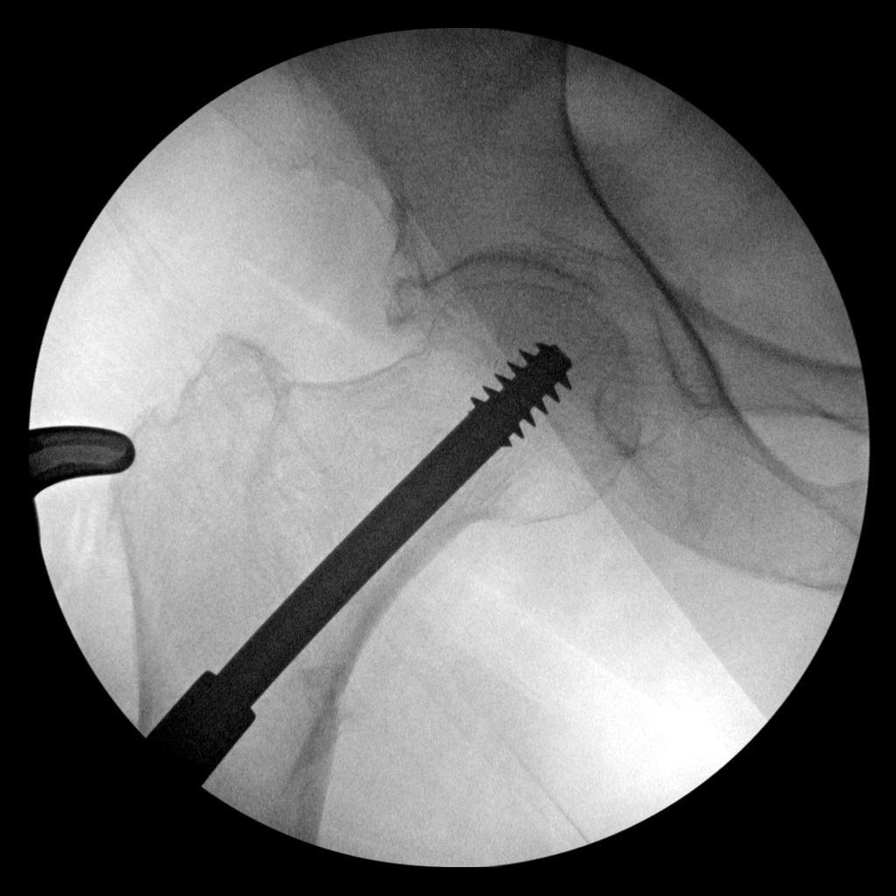
[im 6/6]
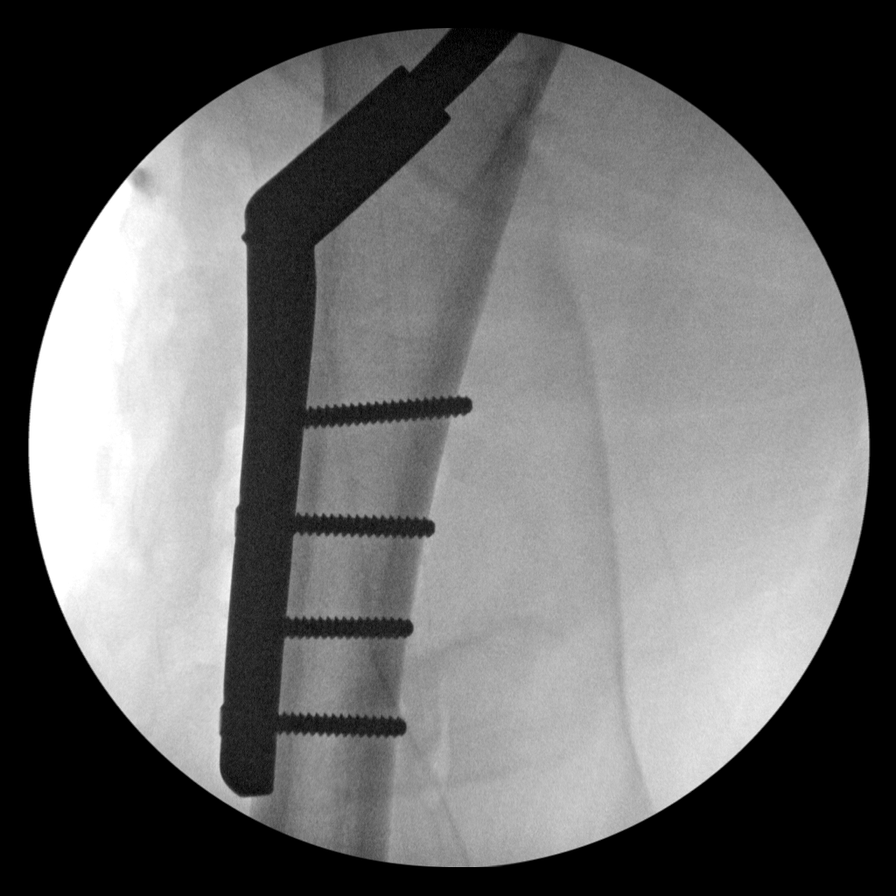

[6 of 6 positions shown; findings below may reference images not displayed]

FINDINGS: 6 intraoperative fluoroscopic spot views of the right hip
demonstrate interval reduction of the previously noted comminuted
intertrochanteric hip fracture, and placement of a dynamic
compression screw fixation device, which appears properly located,
restoring anatomic alignment. No acute complicating features.
IMPRESSION: 1. Intraoperative documentation of ORIF for comminuted
intertrochanteric hip fracture of the right hip, as above.

## 2015-10-24 DIAGNOSIS — G309 Alzheimer's disease, unspecified: Secondary | ICD-10-CM | POA: Diagnosis not present

## 2015-10-25 DIAGNOSIS — G309 Alzheimer's disease, unspecified: Secondary | ICD-10-CM | POA: Diagnosis not present

## 2015-10-26 DIAGNOSIS — G309 Alzheimer's disease, unspecified: Secondary | ICD-10-CM | POA: Diagnosis not present

## 2015-10-27 DIAGNOSIS — G309 Alzheimer's disease, unspecified: Secondary | ICD-10-CM | POA: Diagnosis not present

## 2015-10-28 DIAGNOSIS — G309 Alzheimer's disease, unspecified: Secondary | ICD-10-CM | POA: Diagnosis not present

## 2015-10-29 DIAGNOSIS — G309 Alzheimer's disease, unspecified: Secondary | ICD-10-CM | POA: Diagnosis not present

## 2015-10-30 DIAGNOSIS — I10 Essential (primary) hypertension: Secondary | ICD-10-CM | POA: Diagnosis not present

## 2015-10-30 DIAGNOSIS — G309 Alzheimer's disease, unspecified: Secondary | ICD-10-CM | POA: Diagnosis not present

## 2015-10-30 DIAGNOSIS — F329 Major depressive disorder, single episode, unspecified: Secondary | ICD-10-CM | POA: Diagnosis not present

## 2015-10-30 DIAGNOSIS — E784 Other hyperlipidemia: Secondary | ICD-10-CM | POA: Diagnosis not present

## 2015-10-30 DIAGNOSIS — F0280 Dementia in other diseases classified elsewhere without behavioral disturbance: Secondary | ICD-10-CM | POA: Diagnosis not present

## 2015-10-31 DIAGNOSIS — G309 Alzheimer's disease, unspecified: Secondary | ICD-10-CM | POA: Diagnosis not present

## 2015-11-01 DIAGNOSIS — G309 Alzheimer's disease, unspecified: Secondary | ICD-10-CM | POA: Diagnosis not present

## 2015-11-02 DIAGNOSIS — G309 Alzheimer's disease, unspecified: Secondary | ICD-10-CM | POA: Diagnosis not present

## 2015-11-03 DIAGNOSIS — F0391 Unspecified dementia with behavioral disturbance: Secondary | ICD-10-CM | POA: Diagnosis not present

## 2015-11-03 DIAGNOSIS — G309 Alzheimer's disease, unspecified: Secondary | ICD-10-CM | POA: Diagnosis not present

## 2015-11-03 DIAGNOSIS — F0630 Mood disorder due to known physiological condition, unspecified: Secondary | ICD-10-CM | POA: Diagnosis not present

## 2015-11-03 DIAGNOSIS — F411 Generalized anxiety disorder: Secondary | ICD-10-CM | POA: Diagnosis not present

## 2015-11-03 DIAGNOSIS — R451 Restlessness and agitation: Secondary | ICD-10-CM | POA: Diagnosis not present

## 2015-11-04 DIAGNOSIS — G309 Alzheimer's disease, unspecified: Secondary | ICD-10-CM | POA: Diagnosis not present

## 2015-11-05 DIAGNOSIS — G309 Alzheimer's disease, unspecified: Secondary | ICD-10-CM | POA: Diagnosis not present

## 2015-11-06 DIAGNOSIS — G309 Alzheimer's disease, unspecified: Secondary | ICD-10-CM | POA: Diagnosis not present

## 2015-11-07 DIAGNOSIS — G309 Alzheimer's disease, unspecified: Secondary | ICD-10-CM | POA: Diagnosis not present

## 2015-11-08 DIAGNOSIS — G309 Alzheimer's disease, unspecified: Secondary | ICD-10-CM | POA: Diagnosis not present

## 2015-11-09 DIAGNOSIS — G309 Alzheimer's disease, unspecified: Secondary | ICD-10-CM | POA: Diagnosis not present

## 2015-11-10 DIAGNOSIS — G309 Alzheimer's disease, unspecified: Secondary | ICD-10-CM | POA: Diagnosis not present

## 2015-11-11 DIAGNOSIS — G309 Alzheimer's disease, unspecified: Secondary | ICD-10-CM | POA: Diagnosis not present

## 2015-11-12 DIAGNOSIS — G309 Alzheimer's disease, unspecified: Secondary | ICD-10-CM | POA: Diagnosis not present

## 2015-11-13 DIAGNOSIS — G309 Alzheimer's disease, unspecified: Secondary | ICD-10-CM | POA: Diagnosis not present

## 2015-11-14 DIAGNOSIS — G309 Alzheimer's disease, unspecified: Secondary | ICD-10-CM | POA: Diagnosis not present

## 2015-11-15 DIAGNOSIS — G309 Alzheimer's disease, unspecified: Secondary | ICD-10-CM | POA: Diagnosis not present

## 2015-11-16 DIAGNOSIS — G309 Alzheimer's disease, unspecified: Secondary | ICD-10-CM | POA: Diagnosis not present

## 2015-11-17 DIAGNOSIS — G309 Alzheimer's disease, unspecified: Secondary | ICD-10-CM | POA: Diagnosis not present

## 2015-11-18 DIAGNOSIS — G309 Alzheimer's disease, unspecified: Secondary | ICD-10-CM | POA: Diagnosis not present

## 2015-11-19 DIAGNOSIS — G309 Alzheimer's disease, unspecified: Secondary | ICD-10-CM | POA: Diagnosis not present

## 2015-11-20 DIAGNOSIS — G309 Alzheimer's disease, unspecified: Secondary | ICD-10-CM | POA: Diagnosis not present

## 2015-11-21 DIAGNOSIS — G309 Alzheimer's disease, unspecified: Secondary | ICD-10-CM | POA: Diagnosis not present

## 2015-11-22 DIAGNOSIS — G309 Alzheimer's disease, unspecified: Secondary | ICD-10-CM | POA: Diagnosis not present

## 2015-11-23 DIAGNOSIS — G309 Alzheimer's disease, unspecified: Secondary | ICD-10-CM | POA: Diagnosis not present

## 2015-11-24 DIAGNOSIS — G309 Alzheimer's disease, unspecified: Secondary | ICD-10-CM | POA: Diagnosis not present

## 2015-11-25 DIAGNOSIS — G309 Alzheimer's disease, unspecified: Secondary | ICD-10-CM | POA: Diagnosis not present

## 2015-11-26 DIAGNOSIS — G309 Alzheimer's disease, unspecified: Secondary | ICD-10-CM | POA: Diagnosis not present

## 2015-11-27 DIAGNOSIS — F329 Major depressive disorder, single episode, unspecified: Secondary | ICD-10-CM | POA: Diagnosis not present

## 2015-11-27 DIAGNOSIS — N183 Chronic kidney disease, stage 3 (moderate): Secondary | ICD-10-CM | POA: Diagnosis not present

## 2015-11-27 DIAGNOSIS — I10 Essential (primary) hypertension: Secondary | ICD-10-CM | POA: Diagnosis not present

## 2015-11-27 DIAGNOSIS — G309 Alzheimer's disease, unspecified: Secondary | ICD-10-CM | POA: Diagnosis not present

## 2015-11-28 DIAGNOSIS — R451 Restlessness and agitation: Secondary | ICD-10-CM | POA: Diagnosis not present

## 2015-11-28 DIAGNOSIS — F411 Generalized anxiety disorder: Secondary | ICD-10-CM | POA: Diagnosis not present

## 2015-11-28 DIAGNOSIS — F0630 Mood disorder due to known physiological condition, unspecified: Secondary | ICD-10-CM | POA: Diagnosis not present

## 2015-11-28 DIAGNOSIS — G309 Alzheimer's disease, unspecified: Secondary | ICD-10-CM | POA: Diagnosis not present

## 2015-11-29 DIAGNOSIS — G309 Alzheimer's disease, unspecified: Secondary | ICD-10-CM | POA: Diagnosis not present

## 2015-11-30 DIAGNOSIS — G309 Alzheimer's disease, unspecified: Secondary | ICD-10-CM | POA: Diagnosis not present

## 2015-12-01 DIAGNOSIS — M25552 Pain in left hip: Secondary | ICD-10-CM | POA: Diagnosis not present

## 2015-12-01 DIAGNOSIS — G309 Alzheimer's disease, unspecified: Secondary | ICD-10-CM | POA: Diagnosis not present

## 2015-12-02 DIAGNOSIS — G309 Alzheimer's disease, unspecified: Secondary | ICD-10-CM | POA: Diagnosis not present

## 2015-12-03 DIAGNOSIS — G309 Alzheimer's disease, unspecified: Secondary | ICD-10-CM | POA: Diagnosis not present

## 2015-12-04 DIAGNOSIS — G309 Alzheimer's disease, unspecified: Secondary | ICD-10-CM | POA: Diagnosis not present

## 2015-12-05 DIAGNOSIS — R451 Restlessness and agitation: Secondary | ICD-10-CM | POA: Diagnosis not present

## 2015-12-05 DIAGNOSIS — G309 Alzheimer's disease, unspecified: Secondary | ICD-10-CM | POA: Diagnosis not present

## 2015-12-05 DIAGNOSIS — F0630 Mood disorder due to known physiological condition, unspecified: Secondary | ICD-10-CM | POA: Diagnosis not present

## 2015-12-05 DIAGNOSIS — F411 Generalized anxiety disorder: Secondary | ICD-10-CM | POA: Diagnosis not present

## 2015-12-06 DIAGNOSIS — G309 Alzheimer's disease, unspecified: Secondary | ICD-10-CM | POA: Diagnosis not present

## 2015-12-07 DIAGNOSIS — G309 Alzheimer's disease, unspecified: Secondary | ICD-10-CM | POA: Diagnosis not present

## 2015-12-08 DIAGNOSIS — F0630 Mood disorder due to known physiological condition, unspecified: Secondary | ICD-10-CM | POA: Diagnosis not present

## 2015-12-08 DIAGNOSIS — F0391 Unspecified dementia with behavioral disturbance: Secondary | ICD-10-CM | POA: Diagnosis not present

## 2015-12-08 DIAGNOSIS — G309 Alzheimer's disease, unspecified: Secondary | ICD-10-CM | POA: Diagnosis not present

## 2015-12-08 DIAGNOSIS — F411 Generalized anxiety disorder: Secondary | ICD-10-CM | POA: Diagnosis not present

## 2015-12-08 DIAGNOSIS — R451 Restlessness and agitation: Secondary | ICD-10-CM | POA: Diagnosis not present

## 2015-12-09 DIAGNOSIS — G309 Alzheimer's disease, unspecified: Secondary | ICD-10-CM | POA: Diagnosis not present

## 2015-12-10 DIAGNOSIS — G309 Alzheimer's disease, unspecified: Secondary | ICD-10-CM | POA: Diagnosis not present

## 2015-12-11 DIAGNOSIS — G309 Alzheimer's disease, unspecified: Secondary | ICD-10-CM | POA: Diagnosis not present

## 2015-12-12 DIAGNOSIS — G309 Alzheimer's disease, unspecified: Secondary | ICD-10-CM | POA: Diagnosis not present

## 2015-12-13 DIAGNOSIS — G309 Alzheimer's disease, unspecified: Secondary | ICD-10-CM | POA: Diagnosis not present

## 2015-12-14 DIAGNOSIS — G309 Alzheimer's disease, unspecified: Secondary | ICD-10-CM | POA: Diagnosis not present

## 2015-12-15 DIAGNOSIS — G309 Alzheimer's disease, unspecified: Secondary | ICD-10-CM | POA: Diagnosis not present

## 2015-12-16 DIAGNOSIS — G309 Alzheimer's disease, unspecified: Secondary | ICD-10-CM | POA: Diagnosis not present

## 2015-12-17 DIAGNOSIS — G309 Alzheimer's disease, unspecified: Secondary | ICD-10-CM | POA: Diagnosis not present

## 2015-12-18 DIAGNOSIS — G309 Alzheimer's disease, unspecified: Secondary | ICD-10-CM | POA: Diagnosis not present

## 2015-12-19 DIAGNOSIS — R451 Restlessness and agitation: Secondary | ICD-10-CM | POA: Diagnosis not present

## 2015-12-19 DIAGNOSIS — F0630 Mood disorder due to known physiological condition, unspecified: Secondary | ICD-10-CM | POA: Diagnosis not present

## 2015-12-19 DIAGNOSIS — F411 Generalized anxiety disorder: Secondary | ICD-10-CM | POA: Diagnosis not present

## 2015-12-19 DIAGNOSIS — G309 Alzheimer's disease, unspecified: Secondary | ICD-10-CM | POA: Diagnosis not present

## 2015-12-20 DIAGNOSIS — G309 Alzheimer's disease, unspecified: Secondary | ICD-10-CM | POA: Diagnosis not present

## 2015-12-21 DIAGNOSIS — G309 Alzheimer's disease, unspecified: Secondary | ICD-10-CM | POA: Diagnosis not present

## 2015-12-22 DIAGNOSIS — G309 Alzheimer's disease, unspecified: Secondary | ICD-10-CM | POA: Diagnosis not present

## 2015-12-23 DIAGNOSIS — E119 Type 2 diabetes mellitus without complications: Secondary | ICD-10-CM | POA: Diagnosis not present

## 2015-12-23 DIAGNOSIS — Z79899 Other long term (current) drug therapy: Secondary | ICD-10-CM | POA: Diagnosis not present

## 2015-12-23 DIAGNOSIS — E038 Other specified hypothyroidism: Secondary | ICD-10-CM | POA: Diagnosis not present

## 2015-12-23 DIAGNOSIS — E559 Vitamin D deficiency, unspecified: Secondary | ICD-10-CM | POA: Diagnosis not present

## 2015-12-23 DIAGNOSIS — D518 Other vitamin B12 deficiency anemias: Secondary | ICD-10-CM | POA: Diagnosis not present

## 2015-12-23 DIAGNOSIS — E782 Mixed hyperlipidemia: Secondary | ICD-10-CM | POA: Diagnosis not present

## 2015-12-23 DIAGNOSIS — G309 Alzheimer's disease, unspecified: Secondary | ICD-10-CM | POA: Diagnosis not present

## 2015-12-24 DIAGNOSIS — G309 Alzheimer's disease, unspecified: Secondary | ICD-10-CM | POA: Diagnosis not present

## 2015-12-25 DIAGNOSIS — N183 Chronic kidney disease, stage 3 (moderate): Secondary | ICD-10-CM | POA: Diagnosis not present

## 2015-12-25 DIAGNOSIS — I10 Essential (primary) hypertension: Secondary | ICD-10-CM | POA: Diagnosis not present

## 2015-12-25 DIAGNOSIS — F0280 Dementia in other diseases classified elsewhere without behavioral disturbance: Secondary | ICD-10-CM | POA: Diagnosis not present

## 2015-12-25 DIAGNOSIS — G309 Alzheimer's disease, unspecified: Secondary | ICD-10-CM | POA: Diagnosis not present

## 2015-12-25 DIAGNOSIS — E784 Other hyperlipidemia: Secondary | ICD-10-CM | POA: Diagnosis not present

## 2015-12-26 DIAGNOSIS — G309 Alzheimer's disease, unspecified: Secondary | ICD-10-CM | POA: Diagnosis not present

## 2015-12-27 DIAGNOSIS — G309 Alzheimer's disease, unspecified: Secondary | ICD-10-CM | POA: Diagnosis not present

## 2015-12-28 DIAGNOSIS — G309 Alzheimer's disease, unspecified: Secondary | ICD-10-CM | POA: Diagnosis not present

## 2015-12-29 DIAGNOSIS — G309 Alzheimer's disease, unspecified: Secondary | ICD-10-CM | POA: Diagnosis not present

## 2015-12-30 DIAGNOSIS — G309 Alzheimer's disease, unspecified: Secondary | ICD-10-CM | POA: Diagnosis not present

## 2015-12-31 DIAGNOSIS — G309 Alzheimer's disease, unspecified: Secondary | ICD-10-CM | POA: Diagnosis not present

## 2016-01-01 DIAGNOSIS — G309 Alzheimer's disease, unspecified: Secondary | ICD-10-CM | POA: Diagnosis not present

## 2016-01-02 DIAGNOSIS — G309 Alzheimer's disease, unspecified: Secondary | ICD-10-CM | POA: Diagnosis not present

## 2016-01-02 DIAGNOSIS — F411 Generalized anxiety disorder: Secondary | ICD-10-CM | POA: Diagnosis not present

## 2016-01-02 DIAGNOSIS — F0630 Mood disorder due to known physiological condition, unspecified: Secondary | ICD-10-CM | POA: Diagnosis not present

## 2016-01-02 DIAGNOSIS — R451 Restlessness and agitation: Secondary | ICD-10-CM | POA: Diagnosis not present

## 2016-01-03 DIAGNOSIS — G309 Alzheimer's disease, unspecified: Secondary | ICD-10-CM | POA: Diagnosis not present

## 2016-01-04 DIAGNOSIS — G309 Alzheimer's disease, unspecified: Secondary | ICD-10-CM | POA: Diagnosis not present

## 2016-01-05 DIAGNOSIS — F411 Generalized anxiety disorder: Secondary | ICD-10-CM | POA: Diagnosis not present

## 2016-01-05 DIAGNOSIS — G309 Alzheimer's disease, unspecified: Secondary | ICD-10-CM | POA: Diagnosis not present

## 2016-01-05 DIAGNOSIS — F0391 Unspecified dementia with behavioral disturbance: Secondary | ICD-10-CM | POA: Diagnosis not present

## 2016-01-05 DIAGNOSIS — R451 Restlessness and agitation: Secondary | ICD-10-CM | POA: Diagnosis not present

## 2016-01-05 DIAGNOSIS — F0630 Mood disorder due to known physiological condition, unspecified: Secondary | ICD-10-CM | POA: Diagnosis not present

## 2016-01-06 DIAGNOSIS — G309 Alzheimer's disease, unspecified: Secondary | ICD-10-CM | POA: Diagnosis not present

## 2016-01-07 DIAGNOSIS — G309 Alzheimer's disease, unspecified: Secondary | ICD-10-CM | POA: Diagnosis not present

## 2016-01-08 DIAGNOSIS — G309 Alzheimer's disease, unspecified: Secondary | ICD-10-CM | POA: Diagnosis not present

## 2016-01-09 DIAGNOSIS — G309 Alzheimer's disease, unspecified: Secondary | ICD-10-CM | POA: Diagnosis not present

## 2016-01-10 DIAGNOSIS — G309 Alzheimer's disease, unspecified: Secondary | ICD-10-CM | POA: Diagnosis not present

## 2016-01-11 DIAGNOSIS — G309 Alzheimer's disease, unspecified: Secondary | ICD-10-CM | POA: Diagnosis not present

## 2016-01-12 DIAGNOSIS — G309 Alzheimer's disease, unspecified: Secondary | ICD-10-CM | POA: Diagnosis not present

## 2016-01-13 DIAGNOSIS — G309 Alzheimer's disease, unspecified: Secondary | ICD-10-CM | POA: Diagnosis not present

## 2016-01-14 DIAGNOSIS — G309 Alzheimer's disease, unspecified: Secondary | ICD-10-CM | POA: Diagnosis not present

## 2016-01-15 DIAGNOSIS — G309 Alzheimer's disease, unspecified: Secondary | ICD-10-CM | POA: Diagnosis not present

## 2016-01-16 DIAGNOSIS — G309 Alzheimer's disease, unspecified: Secondary | ICD-10-CM | POA: Diagnosis not present

## 2016-01-17 DIAGNOSIS — G309 Alzheimer's disease, unspecified: Secondary | ICD-10-CM | POA: Diagnosis not present

## 2016-01-18 DIAGNOSIS — G309 Alzheimer's disease, unspecified: Secondary | ICD-10-CM | POA: Diagnosis not present

## 2016-01-19 DIAGNOSIS — G309 Alzheimer's disease, unspecified: Secondary | ICD-10-CM | POA: Diagnosis not present

## 2016-01-20 DIAGNOSIS — G309 Alzheimer's disease, unspecified: Secondary | ICD-10-CM | POA: Diagnosis not present

## 2016-01-21 DIAGNOSIS — G309 Alzheimer's disease, unspecified: Secondary | ICD-10-CM | POA: Diagnosis not present

## 2016-01-22 DIAGNOSIS — G309 Alzheimer's disease, unspecified: Secondary | ICD-10-CM | POA: Diagnosis not present

## 2016-01-23 DIAGNOSIS — G309 Alzheimer's disease, unspecified: Secondary | ICD-10-CM | POA: Diagnosis not present

## 2016-01-24 DIAGNOSIS — G309 Alzheimer's disease, unspecified: Secondary | ICD-10-CM | POA: Diagnosis not present

## 2016-01-25 DIAGNOSIS — G309 Alzheimer's disease, unspecified: Secondary | ICD-10-CM | POA: Diagnosis not present

## 2016-01-26 DIAGNOSIS — G309 Alzheimer's disease, unspecified: Secondary | ICD-10-CM | POA: Diagnosis not present

## 2016-01-27 DIAGNOSIS — G309 Alzheimer's disease, unspecified: Secondary | ICD-10-CM | POA: Diagnosis not present

## 2016-01-28 DIAGNOSIS — G309 Alzheimer's disease, unspecified: Secondary | ICD-10-CM | POA: Diagnosis not present

## 2016-01-29 DIAGNOSIS — I10 Essential (primary) hypertension: Secondary | ICD-10-CM | POA: Diagnosis not present

## 2016-01-29 DIAGNOSIS — G309 Alzheimer's disease, unspecified: Secondary | ICD-10-CM | POA: Diagnosis not present

## 2016-01-29 DIAGNOSIS — F0390 Unspecified dementia without behavioral disturbance: Secondary | ICD-10-CM | POA: Diagnosis not present

## 2016-01-29 DIAGNOSIS — N183 Chronic kidney disease, stage 3 (moderate): Secondary | ICD-10-CM | POA: Diagnosis not present

## 2016-01-29 DIAGNOSIS — F329 Major depressive disorder, single episode, unspecified: Secondary | ICD-10-CM | POA: Diagnosis not present

## 2016-01-30 DIAGNOSIS — G309 Alzheimer's disease, unspecified: Secondary | ICD-10-CM | POA: Diagnosis not present

## 2016-01-31 DIAGNOSIS — G309 Alzheimer's disease, unspecified: Secondary | ICD-10-CM | POA: Diagnosis not present

## 2016-02-01 DIAGNOSIS — G309 Alzheimer's disease, unspecified: Secondary | ICD-10-CM | POA: Diagnosis not present

## 2016-02-02 DIAGNOSIS — F411 Generalized anxiety disorder: Secondary | ICD-10-CM | POA: Diagnosis not present

## 2016-02-02 DIAGNOSIS — G309 Alzheimer's disease, unspecified: Secondary | ICD-10-CM | POA: Diagnosis not present

## 2016-02-02 DIAGNOSIS — F0391 Unspecified dementia with behavioral disturbance: Secondary | ICD-10-CM | POA: Diagnosis not present

## 2016-02-02 DIAGNOSIS — F0630 Mood disorder due to known physiological condition, unspecified: Secondary | ICD-10-CM | POA: Diagnosis not present

## 2016-02-02 DIAGNOSIS — R451 Restlessness and agitation: Secondary | ICD-10-CM | POA: Diagnosis not present

## 2016-02-03 DIAGNOSIS — G309 Alzheimer's disease, unspecified: Secondary | ICD-10-CM | POA: Diagnosis not present

## 2016-02-04 DIAGNOSIS — G309 Alzheimer's disease, unspecified: Secondary | ICD-10-CM | POA: Diagnosis not present

## 2016-02-05 DIAGNOSIS — G309 Alzheimer's disease, unspecified: Secondary | ICD-10-CM | POA: Diagnosis not present

## 2016-02-06 DIAGNOSIS — G309 Alzheimer's disease, unspecified: Secondary | ICD-10-CM | POA: Diagnosis not present

## 2016-02-07 DIAGNOSIS — G309 Alzheimer's disease, unspecified: Secondary | ICD-10-CM | POA: Diagnosis not present

## 2016-02-08 DIAGNOSIS — G309 Alzheimer's disease, unspecified: Secondary | ICD-10-CM | POA: Diagnosis not present

## 2016-02-09 DIAGNOSIS — G309 Alzheimer's disease, unspecified: Secondary | ICD-10-CM | POA: Diagnosis not present

## 2016-02-10 DIAGNOSIS — G309 Alzheimer's disease, unspecified: Secondary | ICD-10-CM | POA: Diagnosis not present

## 2016-02-11 DIAGNOSIS — G309 Alzheimer's disease, unspecified: Secondary | ICD-10-CM | POA: Diagnosis not present

## 2016-02-12 DIAGNOSIS — G309 Alzheimer's disease, unspecified: Secondary | ICD-10-CM | POA: Diagnosis not present

## 2016-02-13 DIAGNOSIS — G309 Alzheimer's disease, unspecified: Secondary | ICD-10-CM | POA: Diagnosis not present

## 2016-02-14 DIAGNOSIS — G309 Alzheimer's disease, unspecified: Secondary | ICD-10-CM | POA: Diagnosis not present

## 2016-02-15 DIAGNOSIS — G309 Alzheimer's disease, unspecified: Secondary | ICD-10-CM | POA: Diagnosis not present

## 2016-02-16 DIAGNOSIS — G309 Alzheimer's disease, unspecified: Secondary | ICD-10-CM | POA: Diagnosis not present

## 2016-02-17 DIAGNOSIS — G309 Alzheimer's disease, unspecified: Secondary | ICD-10-CM | POA: Diagnosis not present

## 2016-02-18 DIAGNOSIS — G309 Alzheimer's disease, unspecified: Secondary | ICD-10-CM | POA: Diagnosis not present

## 2016-02-19 DIAGNOSIS — G309 Alzheimer's disease, unspecified: Secondary | ICD-10-CM | POA: Diagnosis not present

## 2016-02-20 DIAGNOSIS — G309 Alzheimer's disease, unspecified: Secondary | ICD-10-CM | POA: Diagnosis not present

## 2016-02-20 DIAGNOSIS — R451 Restlessness and agitation: Secondary | ICD-10-CM | POA: Diagnosis not present

## 2016-02-20 DIAGNOSIS — F411 Generalized anxiety disorder: Secondary | ICD-10-CM | POA: Diagnosis not present

## 2016-02-21 DIAGNOSIS — G309 Alzheimer's disease, unspecified: Secondary | ICD-10-CM | POA: Diagnosis not present

## 2016-02-22 DIAGNOSIS — G309 Alzheimer's disease, unspecified: Secondary | ICD-10-CM | POA: Diagnosis not present

## 2016-02-23 DIAGNOSIS — G309 Alzheimer's disease, unspecified: Secondary | ICD-10-CM | POA: Diagnosis not present

## 2016-02-24 DIAGNOSIS — G309 Alzheimer's disease, unspecified: Secondary | ICD-10-CM | POA: Diagnosis not present

## 2016-02-25 DIAGNOSIS — G309 Alzheimer's disease, unspecified: Secondary | ICD-10-CM | POA: Diagnosis not present

## 2016-02-26 DIAGNOSIS — I10 Essential (primary) hypertension: Secondary | ICD-10-CM | POA: Diagnosis not present

## 2016-02-26 DIAGNOSIS — F329 Major depressive disorder, single episode, unspecified: Secondary | ICD-10-CM | POA: Diagnosis not present

## 2016-02-26 DIAGNOSIS — G309 Alzheimer's disease, unspecified: Secondary | ICD-10-CM | POA: Diagnosis not present

## 2016-02-26 DIAGNOSIS — K219 Gastro-esophageal reflux disease without esophagitis: Secondary | ICD-10-CM | POA: Diagnosis not present

## 2016-02-26 DIAGNOSIS — F0280 Dementia in other diseases classified elsewhere without behavioral disturbance: Secondary | ICD-10-CM | POA: Diagnosis not present

## 2016-02-27 DIAGNOSIS — G309 Alzheimer's disease, unspecified: Secondary | ICD-10-CM | POA: Diagnosis not present

## 2016-02-28 DIAGNOSIS — G309 Alzheimer's disease, unspecified: Secondary | ICD-10-CM | POA: Diagnosis not present

## 2016-02-29 DIAGNOSIS — G309 Alzheimer's disease, unspecified: Secondary | ICD-10-CM | POA: Diagnosis not present

## 2016-03-01 DIAGNOSIS — G309 Alzheimer's disease, unspecified: Secondary | ICD-10-CM | POA: Diagnosis not present

## 2016-03-02 DIAGNOSIS — G309 Alzheimer's disease, unspecified: Secondary | ICD-10-CM | POA: Diagnosis not present

## 2016-03-03 DIAGNOSIS — S0081XA Abrasion of other part of head, initial encounter: Secondary | ICD-10-CM | POA: Diagnosis not present

## 2016-03-03 DIAGNOSIS — S0093XA Contusion of unspecified part of head, initial encounter: Secondary | ICD-10-CM | POA: Diagnosis not present

## 2016-03-03 DIAGNOSIS — R404 Transient alteration of awareness: Secondary | ICD-10-CM | POA: Diagnosis not present

## 2016-03-03 DIAGNOSIS — F329 Major depressive disorder, single episode, unspecified: Secondary | ICD-10-CM | POA: Diagnosis not present

## 2016-03-03 DIAGNOSIS — W1839XA Other fall on same level, initial encounter: Secondary | ICD-10-CM | POA: Diagnosis not present

## 2016-03-03 DIAGNOSIS — K219 Gastro-esophageal reflux disease without esophagitis: Secondary | ICD-10-CM | POA: Diagnosis not present

## 2016-03-03 DIAGNOSIS — Z79899 Other long term (current) drug therapy: Secondary | ICD-10-CM | POA: Diagnosis not present

## 2016-03-03 DIAGNOSIS — G309 Alzheimer's disease, unspecified: Secondary | ICD-10-CM | POA: Diagnosis not present

## 2016-03-03 DIAGNOSIS — I129 Hypertensive chronic kidney disease with stage 1 through stage 4 chronic kidney disease, or unspecified chronic kidney disease: Secondary | ICD-10-CM | POA: Diagnosis not present

## 2016-03-03 DIAGNOSIS — N182 Chronic kidney disease, stage 2 (mild): Secondary | ICD-10-CM | POA: Diagnosis not present

## 2016-03-03 DIAGNOSIS — S098XXA Other specified injuries of head, initial encounter: Secondary | ICD-10-CM | POA: Diagnosis not present

## 2016-03-03 DIAGNOSIS — F0390 Unspecified dementia without behavioral disturbance: Secondary | ICD-10-CM | POA: Diagnosis not present

## 2016-03-03 DIAGNOSIS — E860 Dehydration: Secondary | ICD-10-CM | POA: Diagnosis not present

## 2016-03-03 DIAGNOSIS — Z7982 Long term (current) use of aspirin: Secondary | ICD-10-CM | POA: Diagnosis not present

## 2016-03-04 DIAGNOSIS — G309 Alzheimer's disease, unspecified: Secondary | ICD-10-CM | POA: Diagnosis not present

## 2016-03-05 DIAGNOSIS — G309 Alzheimer's disease, unspecified: Secondary | ICD-10-CM | POA: Diagnosis not present

## 2016-03-06 DIAGNOSIS — G309 Alzheimer's disease, unspecified: Secondary | ICD-10-CM | POA: Diagnosis not present

## 2016-03-07 DIAGNOSIS — G309 Alzheimer's disease, unspecified: Secondary | ICD-10-CM | POA: Diagnosis not present

## 2016-03-08 DIAGNOSIS — F411 Generalized anxiety disorder: Secondary | ICD-10-CM | POA: Diagnosis not present

## 2016-03-08 DIAGNOSIS — F0391 Unspecified dementia with behavioral disturbance: Secondary | ICD-10-CM | POA: Diagnosis not present

## 2016-03-08 DIAGNOSIS — F0630 Mood disorder due to known physiological condition, unspecified: Secondary | ICD-10-CM | POA: Diagnosis not present

## 2016-03-08 DIAGNOSIS — R451 Restlessness and agitation: Secondary | ICD-10-CM | POA: Diagnosis not present

## 2016-03-08 DIAGNOSIS — G309 Alzheimer's disease, unspecified: Secondary | ICD-10-CM | POA: Diagnosis not present

## 2016-03-09 DIAGNOSIS — G309 Alzheimer's disease, unspecified: Secondary | ICD-10-CM | POA: Diagnosis not present

## 2016-03-10 DIAGNOSIS — G309 Alzheimer's disease, unspecified: Secondary | ICD-10-CM | POA: Diagnosis not present

## 2016-03-11 DIAGNOSIS — G309 Alzheimer's disease, unspecified: Secondary | ICD-10-CM | POA: Diagnosis not present

## 2016-03-12 DIAGNOSIS — G309 Alzheimer's disease, unspecified: Secondary | ICD-10-CM | POA: Diagnosis not present

## 2016-03-13 DIAGNOSIS — G309 Alzheimer's disease, unspecified: Secondary | ICD-10-CM | POA: Diagnosis not present

## 2016-03-14 DIAGNOSIS — G309 Alzheimer's disease, unspecified: Secondary | ICD-10-CM | POA: Diagnosis not present

## 2016-03-15 DIAGNOSIS — G309 Alzheimer's disease, unspecified: Secondary | ICD-10-CM | POA: Diagnosis not present

## 2016-03-16 DIAGNOSIS — Z79899 Other long term (current) drug therapy: Secondary | ICD-10-CM | POA: Diagnosis not present

## 2016-03-16 DIAGNOSIS — E559 Vitamin D deficiency, unspecified: Secondary | ICD-10-CM | POA: Diagnosis not present

## 2016-03-16 DIAGNOSIS — E038 Other specified hypothyroidism: Secondary | ICD-10-CM | POA: Diagnosis not present

## 2016-03-16 DIAGNOSIS — G309 Alzheimer's disease, unspecified: Secondary | ICD-10-CM | POA: Diagnosis not present

## 2016-03-16 DIAGNOSIS — E1165 Type 2 diabetes mellitus with hyperglycemia: Secondary | ICD-10-CM | POA: Diagnosis not present

## 2016-03-16 DIAGNOSIS — E782 Mixed hyperlipidemia: Secondary | ICD-10-CM | POA: Diagnosis not present

## 2016-03-16 DIAGNOSIS — D518 Other vitamin B12 deficiency anemias: Secondary | ICD-10-CM | POA: Diagnosis not present

## 2016-03-17 DIAGNOSIS — G309 Alzheimer's disease, unspecified: Secondary | ICD-10-CM | POA: Diagnosis not present

## 2016-03-18 DIAGNOSIS — I10 Essential (primary) hypertension: Secondary | ICD-10-CM | POA: Diagnosis not present

## 2016-03-18 DIAGNOSIS — I70203 Unspecified atherosclerosis of native arteries of extremities, bilateral legs: Secondary | ICD-10-CM | POA: Diagnosis not present

## 2016-03-18 DIAGNOSIS — R1110 Vomiting, unspecified: Secondary | ICD-10-CM | POA: Diagnosis not present

## 2016-03-18 DIAGNOSIS — B351 Tinea unguium: Secondary | ICD-10-CM | POA: Diagnosis not present

## 2016-03-18 DIAGNOSIS — G309 Alzheimer's disease, unspecified: Secondary | ICD-10-CM | POA: Diagnosis not present

## 2016-03-19 DIAGNOSIS — G309 Alzheimer's disease, unspecified: Secondary | ICD-10-CM | POA: Diagnosis not present

## 2016-03-19 DIAGNOSIS — R451 Restlessness and agitation: Secondary | ICD-10-CM | POA: Diagnosis not present

## 2016-03-19 DIAGNOSIS — F411 Generalized anxiety disorder: Secondary | ICD-10-CM | POA: Diagnosis not present

## 2016-03-19 DIAGNOSIS — F0630 Mood disorder due to known physiological condition, unspecified: Secondary | ICD-10-CM | POA: Diagnosis not present

## 2016-03-20 DIAGNOSIS — G309 Alzheimer's disease, unspecified: Secondary | ICD-10-CM | POA: Diagnosis not present

## 2016-03-21 DIAGNOSIS — G309 Alzheimer's disease, unspecified: Secondary | ICD-10-CM | POA: Diagnosis not present

## 2016-03-22 DIAGNOSIS — G309 Alzheimer's disease, unspecified: Secondary | ICD-10-CM | POA: Diagnosis not present

## 2016-03-23 DIAGNOSIS — G309 Alzheimer's disease, unspecified: Secondary | ICD-10-CM | POA: Diagnosis not present

## 2016-03-24 DIAGNOSIS — G309 Alzheimer's disease, unspecified: Secondary | ICD-10-CM | POA: Diagnosis not present

## 2016-03-25 DIAGNOSIS — G309 Alzheimer's disease, unspecified: Secondary | ICD-10-CM | POA: Diagnosis not present

## 2016-03-26 DIAGNOSIS — R1111 Vomiting without nausea: Secondary | ICD-10-CM | POA: Diagnosis not present

## 2016-03-26 DIAGNOSIS — G309 Alzheimer's disease, unspecified: Secondary | ICD-10-CM | POA: Diagnosis not present

## 2016-03-27 DIAGNOSIS — G309 Alzheimer's disease, unspecified: Secondary | ICD-10-CM | POA: Diagnosis not present

## 2016-03-28 DIAGNOSIS — G309 Alzheimer's disease, unspecified: Secondary | ICD-10-CM | POA: Diagnosis not present

## 2016-03-29 DIAGNOSIS — G309 Alzheimer's disease, unspecified: Secondary | ICD-10-CM | POA: Diagnosis not present

## 2016-03-30 DIAGNOSIS — G309 Alzheimer's disease, unspecified: Secondary | ICD-10-CM | POA: Diagnosis not present

## 2016-03-31 DIAGNOSIS — G309 Alzheimer's disease, unspecified: Secondary | ICD-10-CM | POA: Diagnosis not present

## 2016-04-01 DIAGNOSIS — F0280 Dementia in other diseases classified elsewhere without behavioral disturbance: Secondary | ICD-10-CM | POA: Diagnosis not present

## 2016-04-01 DIAGNOSIS — N183 Chronic kidney disease, stage 3 (moderate): Secondary | ICD-10-CM | POA: Diagnosis not present

## 2016-04-01 DIAGNOSIS — G309 Alzheimer's disease, unspecified: Secondary | ICD-10-CM | POA: Diagnosis not present

## 2016-04-01 DIAGNOSIS — I10 Essential (primary) hypertension: Secondary | ICD-10-CM | POA: Diagnosis not present

## 2016-04-01 DIAGNOSIS — M1990 Unspecified osteoarthritis, unspecified site: Secondary | ICD-10-CM | POA: Diagnosis not present

## 2016-04-02 DIAGNOSIS — G309 Alzheimer's disease, unspecified: Secondary | ICD-10-CM | POA: Diagnosis not present

## 2016-04-03 DIAGNOSIS — G309 Alzheimer's disease, unspecified: Secondary | ICD-10-CM | POA: Diagnosis not present

## 2016-04-04 DIAGNOSIS — G309 Alzheimer's disease, unspecified: Secondary | ICD-10-CM | POA: Diagnosis not present

## 2016-04-05 DIAGNOSIS — F411 Generalized anxiety disorder: Secondary | ICD-10-CM | POA: Diagnosis not present

## 2016-04-05 DIAGNOSIS — F0630 Mood disorder due to known physiological condition, unspecified: Secondary | ICD-10-CM | POA: Diagnosis not present

## 2016-04-05 DIAGNOSIS — G309 Alzheimer's disease, unspecified: Secondary | ICD-10-CM | POA: Diagnosis not present

## 2016-04-05 DIAGNOSIS — F0391 Unspecified dementia with behavioral disturbance: Secondary | ICD-10-CM | POA: Diagnosis not present

## 2016-04-05 DIAGNOSIS — R451 Restlessness and agitation: Secondary | ICD-10-CM | POA: Diagnosis not present

## 2016-04-06 DIAGNOSIS — G309 Alzheimer's disease, unspecified: Secondary | ICD-10-CM | POA: Diagnosis not present

## 2016-04-07 DIAGNOSIS — G309 Alzheimer's disease, unspecified: Secondary | ICD-10-CM | POA: Diagnosis not present

## 2016-04-08 DIAGNOSIS — R32 Unspecified urinary incontinence: Secondary | ICD-10-CM | POA: Diagnosis not present

## 2016-04-08 DIAGNOSIS — G309 Alzheimer's disease, unspecified: Secondary | ICD-10-CM | POA: Diagnosis not present

## 2016-04-09 DIAGNOSIS — G309 Alzheimer's disease, unspecified: Secondary | ICD-10-CM | POA: Diagnosis not present

## 2016-04-10 DIAGNOSIS — G309 Alzheimer's disease, unspecified: Secondary | ICD-10-CM | POA: Diagnosis not present

## 2016-04-11 DIAGNOSIS — G309 Alzheimer's disease, unspecified: Secondary | ICD-10-CM | POA: Diagnosis not present

## 2016-04-12 DIAGNOSIS — G309 Alzheimer's disease, unspecified: Secondary | ICD-10-CM | POA: Diagnosis not present

## 2016-04-13 DIAGNOSIS — G309 Alzheimer's disease, unspecified: Secondary | ICD-10-CM | POA: Diagnosis not present

## 2016-04-14 DIAGNOSIS — G309 Alzheimer's disease, unspecified: Secondary | ICD-10-CM | POA: Diagnosis not present

## 2016-04-15 DIAGNOSIS — G309 Alzheimer's disease, unspecified: Secondary | ICD-10-CM | POA: Diagnosis not present

## 2016-04-16 DIAGNOSIS — G309 Alzheimer's disease, unspecified: Secondary | ICD-10-CM | POA: Diagnosis not present

## 2016-04-17 DIAGNOSIS — G309 Alzheimer's disease, unspecified: Secondary | ICD-10-CM | POA: Diagnosis not present

## 2016-04-18 DIAGNOSIS — G309 Alzheimer's disease, unspecified: Secondary | ICD-10-CM | POA: Diagnosis not present

## 2016-04-19 DIAGNOSIS — G309 Alzheimer's disease, unspecified: Secondary | ICD-10-CM | POA: Diagnosis not present

## 2016-04-20 DIAGNOSIS — G309 Alzheimer's disease, unspecified: Secondary | ICD-10-CM | POA: Diagnosis not present

## 2016-04-21 DIAGNOSIS — G309 Alzheimer's disease, unspecified: Secondary | ICD-10-CM | POA: Diagnosis not present

## 2016-04-22 DIAGNOSIS — G309 Alzheimer's disease, unspecified: Secondary | ICD-10-CM | POA: Diagnosis not present

## 2016-04-23 DIAGNOSIS — G309 Alzheimer's disease, unspecified: Secondary | ICD-10-CM | POA: Diagnosis not present

## 2016-04-24 DIAGNOSIS — G309 Alzheimer's disease, unspecified: Secondary | ICD-10-CM | POA: Diagnosis not present

## 2016-04-25 DIAGNOSIS — G309 Alzheimer's disease, unspecified: Secondary | ICD-10-CM | POA: Diagnosis not present

## 2016-04-26 DIAGNOSIS — G309 Alzheimer's disease, unspecified: Secondary | ICD-10-CM | POA: Diagnosis not present

## 2016-04-27 DIAGNOSIS — G309 Alzheimer's disease, unspecified: Secondary | ICD-10-CM | POA: Diagnosis not present

## 2016-04-28 DIAGNOSIS — G309 Alzheimer's disease, unspecified: Secondary | ICD-10-CM | POA: Diagnosis not present

## 2016-04-29 DIAGNOSIS — K219 Gastro-esophageal reflux disease without esophagitis: Secondary | ICD-10-CM | POA: Diagnosis not present

## 2016-04-29 DIAGNOSIS — I10 Essential (primary) hypertension: Secondary | ICD-10-CM | POA: Diagnosis not present

## 2016-04-29 DIAGNOSIS — N183 Chronic kidney disease, stage 3 (moderate): Secondary | ICD-10-CM | POA: Diagnosis not present

## 2016-04-29 DIAGNOSIS — G309 Alzheimer's disease, unspecified: Secondary | ICD-10-CM | POA: Diagnosis not present

## 2016-04-30 DIAGNOSIS — G309 Alzheimer's disease, unspecified: Secondary | ICD-10-CM | POA: Diagnosis not present

## 2016-05-01 DIAGNOSIS — I10 Essential (primary) hypertension: Secondary | ICD-10-CM | POA: Diagnosis not present

## 2016-05-01 DIAGNOSIS — G309 Alzheimer's disease, unspecified: Secondary | ICD-10-CM | POA: Diagnosis not present

## 2016-05-01 DIAGNOSIS — E119 Type 2 diabetes mellitus without complications: Secondary | ICD-10-CM | POA: Diagnosis not present

## 2016-05-01 DIAGNOSIS — K219 Gastro-esophageal reflux disease without esophagitis: Secondary | ICD-10-CM | POA: Diagnosis not present

## 2016-05-01 DIAGNOSIS — D649 Anemia, unspecified: Secondary | ICD-10-CM | POA: Diagnosis not present

## 2016-05-02 DIAGNOSIS — G309 Alzheimer's disease, unspecified: Secondary | ICD-10-CM | POA: Diagnosis not present

## 2016-05-03 DIAGNOSIS — G309 Alzheimer's disease, unspecified: Secondary | ICD-10-CM | POA: Diagnosis not present

## 2016-05-04 DIAGNOSIS — I129 Hypertensive chronic kidney disease with stage 1 through stage 4 chronic kidney disease, or unspecified chronic kidney disease: Secondary | ICD-10-CM | POA: Diagnosis not present

## 2016-05-04 DIAGNOSIS — K219 Gastro-esophageal reflux disease without esophagitis: Secondary | ICD-10-CM | POA: Diagnosis not present

## 2016-05-04 DIAGNOSIS — F329 Major depressive disorder, single episode, unspecified: Secondary | ICD-10-CM | POA: Diagnosis not present

## 2016-05-04 DIAGNOSIS — M159 Polyosteoarthritis, unspecified: Secondary | ICD-10-CM | POA: Diagnosis not present

## 2016-05-04 DIAGNOSIS — N189 Chronic kidney disease, unspecified: Secondary | ICD-10-CM | POA: Diagnosis not present

## 2016-05-04 DIAGNOSIS — R260 Ataxic gait: Secondary | ICD-10-CM | POA: Diagnosis not present

## 2016-05-04 DIAGNOSIS — F419 Anxiety disorder, unspecified: Secondary | ICD-10-CM | POA: Diagnosis not present

## 2016-05-04 DIAGNOSIS — F0281 Dementia in other diseases classified elsewhere with behavioral disturbance: Secondary | ICD-10-CM | POA: Diagnosis not present

## 2016-05-04 DIAGNOSIS — G309 Alzheimer's disease, unspecified: Secondary | ICD-10-CM | POA: Diagnosis not present

## 2016-05-05 DIAGNOSIS — G309 Alzheimer's disease, unspecified: Secondary | ICD-10-CM | POA: Diagnosis not present

## 2016-05-06 DIAGNOSIS — G309 Alzheimer's disease, unspecified: Secondary | ICD-10-CM | POA: Diagnosis not present

## 2016-05-07 DIAGNOSIS — E119 Type 2 diabetes mellitus without complications: Secondary | ICD-10-CM | POA: Diagnosis not present

## 2016-05-07 DIAGNOSIS — L602 Onychogryphosis: Secondary | ICD-10-CM | POA: Diagnosis not present

## 2016-05-07 DIAGNOSIS — M21612 Bunion of left foot: Secondary | ICD-10-CM | POA: Diagnosis not present

## 2016-05-07 DIAGNOSIS — L84 Corns and callosities: Secondary | ICD-10-CM | POA: Diagnosis not present

## 2016-05-07 DIAGNOSIS — G309 Alzheimer's disease, unspecified: Secondary | ICD-10-CM | POA: Diagnosis not present

## 2016-05-08 DIAGNOSIS — G309 Alzheimer's disease, unspecified: Secondary | ICD-10-CM | POA: Diagnosis not present

## 2016-05-09 DIAGNOSIS — G309 Alzheimer's disease, unspecified: Secondary | ICD-10-CM | POA: Diagnosis not present

## 2016-05-10 DIAGNOSIS — G309 Alzheimer's disease, unspecified: Secondary | ICD-10-CM | POA: Diagnosis not present

## 2016-05-10 DIAGNOSIS — M159 Polyosteoarthritis, unspecified: Secondary | ICD-10-CM | POA: Diagnosis not present

## 2016-05-10 DIAGNOSIS — F0281 Dementia in other diseases classified elsewhere with behavioral disturbance: Secondary | ICD-10-CM | POA: Diagnosis not present

## 2016-05-10 DIAGNOSIS — F329 Major depressive disorder, single episode, unspecified: Secondary | ICD-10-CM | POA: Diagnosis not present

## 2016-05-10 DIAGNOSIS — F419 Anxiety disorder, unspecified: Secondary | ICD-10-CM | POA: Diagnosis not present

## 2016-05-10 DIAGNOSIS — K219 Gastro-esophageal reflux disease without esophagitis: Secondary | ICD-10-CM | POA: Diagnosis not present

## 2016-05-10 DIAGNOSIS — N189 Chronic kidney disease, unspecified: Secondary | ICD-10-CM | POA: Diagnosis not present

## 2016-05-10 DIAGNOSIS — R260 Ataxic gait: Secondary | ICD-10-CM | POA: Diagnosis not present

## 2016-05-10 DIAGNOSIS — I129 Hypertensive chronic kidney disease with stage 1 through stage 4 chronic kidney disease, or unspecified chronic kidney disease: Secondary | ICD-10-CM | POA: Diagnosis not present

## 2016-05-11 DIAGNOSIS — R32 Unspecified urinary incontinence: Secondary | ICD-10-CM | POA: Diagnosis not present

## 2016-05-11 DIAGNOSIS — F329 Major depressive disorder, single episode, unspecified: Secondary | ICD-10-CM | POA: Diagnosis not present

## 2016-05-11 DIAGNOSIS — F419 Anxiety disorder, unspecified: Secondary | ICD-10-CM | POA: Diagnosis not present

## 2016-05-11 DIAGNOSIS — F0281 Dementia in other diseases classified elsewhere with behavioral disturbance: Secondary | ICD-10-CM | POA: Diagnosis not present

## 2016-05-11 DIAGNOSIS — M159 Polyosteoarthritis, unspecified: Secondary | ICD-10-CM | POA: Diagnosis not present

## 2016-05-11 DIAGNOSIS — G309 Alzheimer's disease, unspecified: Secondary | ICD-10-CM | POA: Diagnosis not present

## 2016-05-11 DIAGNOSIS — I129 Hypertensive chronic kidney disease with stage 1 through stage 4 chronic kidney disease, or unspecified chronic kidney disease: Secondary | ICD-10-CM | POA: Diagnosis not present

## 2016-05-11 DIAGNOSIS — N189 Chronic kidney disease, unspecified: Secondary | ICD-10-CM | POA: Diagnosis not present

## 2016-05-11 DIAGNOSIS — K219 Gastro-esophageal reflux disease without esophagitis: Secondary | ICD-10-CM | POA: Diagnosis not present

## 2016-05-11 DIAGNOSIS — R260 Ataxic gait: Secondary | ICD-10-CM | POA: Diagnosis not present

## 2016-05-12 DIAGNOSIS — F0391 Unspecified dementia with behavioral disturbance: Secondary | ICD-10-CM | POA: Diagnosis not present

## 2016-05-12 DIAGNOSIS — F0630 Mood disorder due to known physiological condition, unspecified: Secondary | ICD-10-CM | POA: Diagnosis not present

## 2016-05-12 DIAGNOSIS — F411 Generalized anxiety disorder: Secondary | ICD-10-CM | POA: Diagnosis not present

## 2016-05-12 DIAGNOSIS — R451 Restlessness and agitation: Secondary | ICD-10-CM | POA: Diagnosis not present

## 2016-05-12 DIAGNOSIS — G309 Alzheimer's disease, unspecified: Secondary | ICD-10-CM | POA: Diagnosis not present

## 2016-05-13 DIAGNOSIS — G309 Alzheimer's disease, unspecified: Secondary | ICD-10-CM | POA: Diagnosis not present

## 2016-05-14 DIAGNOSIS — G309 Alzheimer's disease, unspecified: Secondary | ICD-10-CM | POA: Diagnosis not present

## 2016-05-15 DIAGNOSIS — G309 Alzheimer's disease, unspecified: Secondary | ICD-10-CM | POA: Diagnosis not present

## 2016-05-16 DIAGNOSIS — G309 Alzheimer's disease, unspecified: Secondary | ICD-10-CM | POA: Diagnosis not present

## 2016-05-17 DIAGNOSIS — R260 Ataxic gait: Secondary | ICD-10-CM | POA: Diagnosis not present

## 2016-05-17 DIAGNOSIS — F0281 Dementia in other diseases classified elsewhere with behavioral disturbance: Secondary | ICD-10-CM | POA: Diagnosis not present

## 2016-05-17 DIAGNOSIS — G309 Alzheimer's disease, unspecified: Secondary | ICD-10-CM | POA: Diagnosis not present

## 2016-05-17 DIAGNOSIS — I129 Hypertensive chronic kidney disease with stage 1 through stage 4 chronic kidney disease, or unspecified chronic kidney disease: Secondary | ICD-10-CM | POA: Diagnosis not present

## 2016-05-18 DIAGNOSIS — R260 Ataxic gait: Secondary | ICD-10-CM | POA: Diagnosis not present

## 2016-05-18 DIAGNOSIS — F0281 Dementia in other diseases classified elsewhere with behavioral disturbance: Secondary | ICD-10-CM | POA: Diagnosis not present

## 2016-05-18 DIAGNOSIS — M159 Polyosteoarthritis, unspecified: Secondary | ICD-10-CM | POA: Diagnosis not present

## 2016-05-18 DIAGNOSIS — I129 Hypertensive chronic kidney disease with stage 1 through stage 4 chronic kidney disease, or unspecified chronic kidney disease: Secondary | ICD-10-CM | POA: Diagnosis not present

## 2016-05-18 DIAGNOSIS — F329 Major depressive disorder, single episode, unspecified: Secondary | ICD-10-CM | POA: Diagnosis not present

## 2016-05-18 DIAGNOSIS — G309 Alzheimer's disease, unspecified: Secondary | ICD-10-CM | POA: Diagnosis not present

## 2016-05-18 DIAGNOSIS — N189 Chronic kidney disease, unspecified: Secondary | ICD-10-CM | POA: Diagnosis not present

## 2016-05-18 DIAGNOSIS — K219 Gastro-esophageal reflux disease without esophagitis: Secondary | ICD-10-CM | POA: Diagnosis not present

## 2016-05-18 DIAGNOSIS — F419 Anxiety disorder, unspecified: Secondary | ICD-10-CM | POA: Diagnosis not present

## 2016-05-19 DIAGNOSIS — R260 Ataxic gait: Secondary | ICD-10-CM | POA: Diagnosis not present

## 2016-05-19 DIAGNOSIS — G309 Alzheimer's disease, unspecified: Secondary | ICD-10-CM | POA: Diagnosis not present

## 2016-05-19 DIAGNOSIS — N189 Chronic kidney disease, unspecified: Secondary | ICD-10-CM | POA: Diagnosis not present

## 2016-05-19 DIAGNOSIS — M159 Polyosteoarthritis, unspecified: Secondary | ICD-10-CM | POA: Diagnosis not present

## 2016-05-19 DIAGNOSIS — F329 Major depressive disorder, single episode, unspecified: Secondary | ICD-10-CM | POA: Diagnosis not present

## 2016-05-19 DIAGNOSIS — K219 Gastro-esophageal reflux disease without esophagitis: Secondary | ICD-10-CM | POA: Diagnosis not present

## 2016-05-19 DIAGNOSIS — F419 Anxiety disorder, unspecified: Secondary | ICD-10-CM | POA: Diagnosis not present

## 2016-05-19 DIAGNOSIS — I129 Hypertensive chronic kidney disease with stage 1 through stage 4 chronic kidney disease, or unspecified chronic kidney disease: Secondary | ICD-10-CM | POA: Diagnosis not present

## 2016-05-19 DIAGNOSIS — F0281 Dementia in other diseases classified elsewhere with behavioral disturbance: Secondary | ICD-10-CM | POA: Diagnosis not present

## 2016-05-20 DIAGNOSIS — G309 Alzheimer's disease, unspecified: Secondary | ICD-10-CM | POA: Diagnosis not present

## 2016-05-21 DIAGNOSIS — G309 Alzheimer's disease, unspecified: Secondary | ICD-10-CM | POA: Diagnosis not present

## 2016-05-22 DIAGNOSIS — G309 Alzheimer's disease, unspecified: Secondary | ICD-10-CM | POA: Diagnosis not present

## 2016-05-23 DIAGNOSIS — G309 Alzheimer's disease, unspecified: Secondary | ICD-10-CM | POA: Diagnosis not present

## 2016-05-24 DIAGNOSIS — I129 Hypertensive chronic kidney disease with stage 1 through stage 4 chronic kidney disease, or unspecified chronic kidney disease: Secondary | ICD-10-CM | POA: Diagnosis not present

## 2016-05-24 DIAGNOSIS — F329 Major depressive disorder, single episode, unspecified: Secondary | ICD-10-CM | POA: Diagnosis not present

## 2016-05-24 DIAGNOSIS — M159 Polyosteoarthritis, unspecified: Secondary | ICD-10-CM | POA: Diagnosis not present

## 2016-05-24 DIAGNOSIS — F0281 Dementia in other diseases classified elsewhere with behavioral disturbance: Secondary | ICD-10-CM | POA: Diagnosis not present

## 2016-05-24 DIAGNOSIS — F419 Anxiety disorder, unspecified: Secondary | ICD-10-CM | POA: Diagnosis not present

## 2016-05-24 DIAGNOSIS — N189 Chronic kidney disease, unspecified: Secondary | ICD-10-CM | POA: Diagnosis not present

## 2016-05-24 DIAGNOSIS — K219 Gastro-esophageal reflux disease without esophagitis: Secondary | ICD-10-CM | POA: Diagnosis not present

## 2016-05-24 DIAGNOSIS — R260 Ataxic gait: Secondary | ICD-10-CM | POA: Diagnosis not present

## 2016-05-24 DIAGNOSIS — G309 Alzheimer's disease, unspecified: Secondary | ICD-10-CM | POA: Diagnosis not present

## 2016-05-25 DIAGNOSIS — G309 Alzheimer's disease, unspecified: Secondary | ICD-10-CM | POA: Diagnosis not present

## 2016-05-26 DIAGNOSIS — F0281 Dementia in other diseases classified elsewhere with behavioral disturbance: Secondary | ICD-10-CM | POA: Diagnosis not present

## 2016-05-26 DIAGNOSIS — F419 Anxiety disorder, unspecified: Secondary | ICD-10-CM | POA: Diagnosis not present

## 2016-05-26 DIAGNOSIS — M159 Polyosteoarthritis, unspecified: Secondary | ICD-10-CM | POA: Diagnosis not present

## 2016-05-26 DIAGNOSIS — N189 Chronic kidney disease, unspecified: Secondary | ICD-10-CM | POA: Diagnosis not present

## 2016-05-26 DIAGNOSIS — G309 Alzheimer's disease, unspecified: Secondary | ICD-10-CM | POA: Diagnosis not present

## 2016-05-26 DIAGNOSIS — R260 Ataxic gait: Secondary | ICD-10-CM | POA: Diagnosis not present

## 2016-05-26 DIAGNOSIS — I129 Hypertensive chronic kidney disease with stage 1 through stage 4 chronic kidney disease, or unspecified chronic kidney disease: Secondary | ICD-10-CM | POA: Diagnosis not present

## 2016-05-26 DIAGNOSIS — F329 Major depressive disorder, single episode, unspecified: Secondary | ICD-10-CM | POA: Diagnosis not present

## 2016-05-26 DIAGNOSIS — K219 Gastro-esophageal reflux disease without esophagitis: Secondary | ICD-10-CM | POA: Diagnosis not present

## 2016-05-27 DIAGNOSIS — G309 Alzheimer's disease, unspecified: Secondary | ICD-10-CM | POA: Diagnosis not present

## 2016-05-28 DIAGNOSIS — G309 Alzheimer's disease, unspecified: Secondary | ICD-10-CM | POA: Diagnosis not present

## 2016-05-29 DIAGNOSIS — G309 Alzheimer's disease, unspecified: Secondary | ICD-10-CM | POA: Diagnosis not present

## 2016-05-30 DIAGNOSIS — G309 Alzheimer's disease, unspecified: Secondary | ICD-10-CM | POA: Diagnosis not present

## 2016-05-31 DIAGNOSIS — G309 Alzheimer's disease, unspecified: Secondary | ICD-10-CM | POA: Diagnosis not present

## 2016-06-01 DIAGNOSIS — F0281 Dementia in other diseases classified elsewhere with behavioral disturbance: Secondary | ICD-10-CM | POA: Diagnosis not present

## 2016-06-01 DIAGNOSIS — R260 Ataxic gait: Secondary | ICD-10-CM | POA: Diagnosis not present

## 2016-06-01 DIAGNOSIS — F419 Anxiety disorder, unspecified: Secondary | ICD-10-CM | POA: Diagnosis not present

## 2016-06-01 DIAGNOSIS — I129 Hypertensive chronic kidney disease with stage 1 through stage 4 chronic kidney disease, or unspecified chronic kidney disease: Secondary | ICD-10-CM | POA: Diagnosis not present

## 2016-06-01 DIAGNOSIS — M159 Polyosteoarthritis, unspecified: Secondary | ICD-10-CM | POA: Diagnosis not present

## 2016-06-01 DIAGNOSIS — K219 Gastro-esophageal reflux disease without esophagitis: Secondary | ICD-10-CM | POA: Diagnosis not present

## 2016-06-01 DIAGNOSIS — G309 Alzheimer's disease, unspecified: Secondary | ICD-10-CM | POA: Diagnosis not present

## 2016-06-01 DIAGNOSIS — N189 Chronic kidney disease, unspecified: Secondary | ICD-10-CM | POA: Diagnosis not present

## 2016-06-01 DIAGNOSIS — F329 Major depressive disorder, single episode, unspecified: Secondary | ICD-10-CM | POA: Diagnosis not present

## 2016-06-02 DIAGNOSIS — G309 Alzheimer's disease, unspecified: Secondary | ICD-10-CM | POA: Diagnosis not present

## 2016-06-03 DIAGNOSIS — F329 Major depressive disorder, single episode, unspecified: Secondary | ICD-10-CM | POA: Diagnosis not present

## 2016-06-03 DIAGNOSIS — K219 Gastro-esophageal reflux disease without esophagitis: Secondary | ICD-10-CM | POA: Diagnosis not present

## 2016-06-03 DIAGNOSIS — N189 Chronic kidney disease, unspecified: Secondary | ICD-10-CM | POA: Diagnosis not present

## 2016-06-03 DIAGNOSIS — M159 Polyosteoarthritis, unspecified: Secondary | ICD-10-CM | POA: Diagnosis not present

## 2016-06-03 DIAGNOSIS — F419 Anxiety disorder, unspecified: Secondary | ICD-10-CM | POA: Diagnosis not present

## 2016-06-03 DIAGNOSIS — F0281 Dementia in other diseases classified elsewhere with behavioral disturbance: Secondary | ICD-10-CM | POA: Diagnosis not present

## 2016-06-03 DIAGNOSIS — I129 Hypertensive chronic kidney disease with stage 1 through stage 4 chronic kidney disease, or unspecified chronic kidney disease: Secondary | ICD-10-CM | POA: Diagnosis not present

## 2016-06-03 DIAGNOSIS — R260 Ataxic gait: Secondary | ICD-10-CM | POA: Diagnosis not present

## 2016-06-03 DIAGNOSIS — G309 Alzheimer's disease, unspecified: Secondary | ICD-10-CM | POA: Diagnosis not present

## 2016-06-04 DIAGNOSIS — G309 Alzheimer's disease, unspecified: Secondary | ICD-10-CM | POA: Diagnosis not present

## 2016-06-05 DIAGNOSIS — G309 Alzheimer's disease, unspecified: Secondary | ICD-10-CM | POA: Diagnosis not present

## 2016-06-06 DIAGNOSIS — G309 Alzheimer's disease, unspecified: Secondary | ICD-10-CM | POA: Diagnosis not present

## 2016-06-07 DIAGNOSIS — I129 Hypertensive chronic kidney disease with stage 1 through stage 4 chronic kidney disease, or unspecified chronic kidney disease: Secondary | ICD-10-CM | POA: Diagnosis not present

## 2016-06-07 DIAGNOSIS — M159 Polyosteoarthritis, unspecified: Secondary | ICD-10-CM | POA: Diagnosis not present

## 2016-06-07 DIAGNOSIS — R260 Ataxic gait: Secondary | ICD-10-CM | POA: Diagnosis not present

## 2016-06-07 DIAGNOSIS — K219 Gastro-esophageal reflux disease without esophagitis: Secondary | ICD-10-CM | POA: Diagnosis not present

## 2016-06-07 DIAGNOSIS — F0281 Dementia in other diseases classified elsewhere with behavioral disturbance: Secondary | ICD-10-CM | POA: Diagnosis not present

## 2016-06-07 DIAGNOSIS — G309 Alzheimer's disease, unspecified: Secondary | ICD-10-CM | POA: Diagnosis not present

## 2016-06-07 DIAGNOSIS — N189 Chronic kidney disease, unspecified: Secondary | ICD-10-CM | POA: Diagnosis not present

## 2016-06-07 DIAGNOSIS — F329 Major depressive disorder, single episode, unspecified: Secondary | ICD-10-CM | POA: Diagnosis not present

## 2016-06-07 DIAGNOSIS — F419 Anxiety disorder, unspecified: Secondary | ICD-10-CM | POA: Diagnosis not present

## 2016-06-08 DIAGNOSIS — G309 Alzheimer's disease, unspecified: Secondary | ICD-10-CM | POA: Diagnosis not present

## 2016-06-09 DIAGNOSIS — F411 Generalized anxiety disorder: Secondary | ICD-10-CM | POA: Diagnosis not present

## 2016-06-09 DIAGNOSIS — F0630 Mood disorder due to known physiological condition, unspecified: Secondary | ICD-10-CM | POA: Diagnosis not present

## 2016-06-09 DIAGNOSIS — G309 Alzheimer's disease, unspecified: Secondary | ICD-10-CM | POA: Diagnosis not present

## 2016-06-09 DIAGNOSIS — R451 Restlessness and agitation: Secondary | ICD-10-CM | POA: Diagnosis not present

## 2016-06-09 DIAGNOSIS — F0391 Unspecified dementia with behavioral disturbance: Secondary | ICD-10-CM | POA: Diagnosis not present

## 2016-06-10 DIAGNOSIS — I10 Essential (primary) hypertension: Secondary | ICD-10-CM | POA: Diagnosis not present

## 2016-06-10 DIAGNOSIS — M1990 Unspecified osteoarthritis, unspecified site: Secondary | ICD-10-CM | POA: Diagnosis not present

## 2016-06-10 DIAGNOSIS — N183 Chronic kidney disease, stage 3 (moderate): Secondary | ICD-10-CM | POA: Diagnosis not present

## 2016-06-10 DIAGNOSIS — E785 Hyperlipidemia, unspecified: Secondary | ICD-10-CM | POA: Diagnosis not present

## 2016-06-10 DIAGNOSIS — G309 Alzheimer's disease, unspecified: Secondary | ICD-10-CM | POA: Diagnosis not present

## 2016-06-11 DIAGNOSIS — G309 Alzheimer's disease, unspecified: Secondary | ICD-10-CM | POA: Diagnosis not present

## 2016-06-12 ENCOUNTER — Emergency Department (HOSPITAL_COMMUNITY): Payer: Medicare HMO

## 2016-06-12 ENCOUNTER — Encounter (HOSPITAL_COMMUNITY): Payer: Self-pay | Admitting: Emergency Medicine

## 2016-06-12 ENCOUNTER — Emergency Department (HOSPITAL_COMMUNITY)
Admission: EM | Admit: 2016-06-12 | Discharge: 2016-06-12 | Disposition: A | Discharge status: Home / Self Care | Payer: Medicare HMO | Attending: Emergency Medicine | Admitting: Emergency Medicine

## 2016-06-12 DIAGNOSIS — Z7982 Long term (current) use of aspirin: Secondary | ICD-10-CM | POA: Diagnosis not present

## 2016-06-12 DIAGNOSIS — N183 Chronic kidney disease, stage 3 (moderate): Secondary | ICD-10-CM | POA: Diagnosis not present

## 2016-06-12 DIAGNOSIS — S0083XA Contusion of other part of head, initial encounter: Secondary | ICD-10-CM | POA: Diagnosis not present

## 2016-06-12 DIAGNOSIS — Y939 Activity, unspecified: Secondary | ICD-10-CM | POA: Insufficient documentation

## 2016-06-12 DIAGNOSIS — I129 Hypertensive chronic kidney disease with stage 1 through stage 4 chronic kidney disease, or unspecified chronic kidney disease: Secondary | ICD-10-CM | POA: Insufficient documentation

## 2016-06-12 DIAGNOSIS — R279 Unspecified lack of coordination: Secondary | ICD-10-CM | POA: Diagnosis not present

## 2016-06-12 DIAGNOSIS — G309 Alzheimer's disease, unspecified: Secondary | ICD-10-CM | POA: Diagnosis not present

## 2016-06-12 DIAGNOSIS — S0990XA Unspecified injury of head, initial encounter: Secondary | ICD-10-CM | POA: Diagnosis not present

## 2016-06-12 DIAGNOSIS — Z79899 Other long term (current) drug therapy: Secondary | ICD-10-CM | POA: Insufficient documentation

## 2016-06-12 DIAGNOSIS — W01198A Fall on same level from slipping, tripping and stumbling with subsequent striking against other object, initial encounter: Secondary | ICD-10-CM | POA: Diagnosis not present

## 2016-06-12 DIAGNOSIS — S3993XA Unspecified injury of pelvis, initial encounter: Secondary | ICD-10-CM | POA: Diagnosis not present

## 2016-06-12 DIAGNOSIS — Y929 Unspecified place or not applicable: Secondary | ICD-10-CM | POA: Insufficient documentation

## 2016-06-12 DIAGNOSIS — Y999 Unspecified external cause status: Secondary | ICD-10-CM | POA: Insufficient documentation

## 2016-06-12 DIAGNOSIS — W19XXXA Unspecified fall, initial encounter: Secondary | ICD-10-CM

## 2016-06-12 DIAGNOSIS — Z743 Need for continuous supervision: Secondary | ICD-10-CM | POA: Diagnosis not present

## 2016-06-12 NOTE — ED Provider Notes (Signed)
[facility] Provider Note   CSN: [id number] Arrival date & time: 06/12/16  0941     History   Chief Complaint Chief Complaint  Patient presents with  . Fall    hit head    HPI Laura Savage is a 83 y.o. female.  Pt presents to the ED today after falling out of her wheelchair.  The pt is at a SNF and had a witnessed fall.  EMS said that she fell asleep, tipped forward, and fell out.  She did hit her head.  She is not on blood thinners.  She has dementia and is unable to give any hx.  Pt denies any pain.      Past Medical History:  Diagnosis Date  . Chronic renal insufficiency, stage III (moderate)    2013 renal u/s: medical renal dz  . Dementia    CT brain 05/2013: chronic atrophy and small vessel ischemic changes.  . Depression   . Hypertension   . Insomnia   . Intertrochanteric fracture of right hip (HCC) 02/22/2014   Discharged to [facility] 02/26/14  . LBBB (left bundle branch block) 07/2006  . Osteoarthritis   . Thrombocytopenia [facility])     Patient Active Problem List   Diagnosis Date Noted  . Postoperative anemia due to acute blood loss 02/26/2014  . Thrombocytopenia (HCC) 02/23/2014  . Hyperglycemia 02/23/2014  . Obesity, morbid (HCC) 02/23/2014  . Chronic dementia 02/23/2014  . Intertrochanteric fracture of right femur (HCC) 02/23/2014  . Fall at nursing home 02/22/2014  . Intertrochanteric fracture of right hip (HCC) 02/22/2014  . Hip fracture (HCC)   . Absence of bladder continence 02/13/2014  . HTN (hypertension), benign 10/11/2013  . Preventative health care 10/11/2013  . Dementia with behavioral disturbance 09/07/2012  . Chronic renal insufficiency, stage III (moderate) 09/07/2012  . Fecal incontinence not due to organic disease 07/06/2012  . GERD (gastroesophageal reflux disease) 07/06/2012  . Dysphagia 07/06/2012    Past Surgical History:  Procedure Laterality Date  . CARDIAC CATHETERIZATION  07/2006   Aneurismal dilatation of coronaries, no significant astherosclerotic dz, normal LV function  . CARDIOVASCULAR STRESS TEST  07/2006   No significant obstructive dz, normal LV EF but dyssynergic LV wall contraction c/w LBBB.  Marland Kitchen COMPRESSION HIP SCREW Right 02/23/2014   Procedure: OPEN TREATMENT INTERNAL FIXATION RIGHT HIP;  Surgeon: Vickki Hearing, MD;  Location: [facility];  Service: Orthopedics;  Laterality: Right;  . RECTOCELE REPAIR  08/2002   with cystourethrocele repair at the same time  . VAGINAL HYSTERECTOMY  08/2002    OB History    No data available       Home Medications    Prior to Admission medications   Medication Sig Start Date End Date Taking? Authorizing Provider  acetaminophen (TYLENOL) 325 MG tablet Take 2 tablets (650 mg total) by mouth every 8 (eight) hours. For 3 more days. 02/26/14  Yes Lesle Chris Black, NP  albuterol (PROVENTIL HFA;VENTOLIN HFA) 108 (90 BASE) MCG/ACT inhaler Inhale 2 puffs into the lungs 2 (two) times daily. 07/31/13  Yes Kelle Darting, NP  aspirin 81 MG chewable tablet Chew 81 mg by mouth daily.   Yes Historical Provider, MD  aspirin EC 325 MG EC tablet Take 1 tablet (325 mg total) by mouth daily with breakfast. For 30 days then resume  daily 02/26/14  Yes Lesle Chris Black, NP  bisacodyl (DULCOLAX) 5 MG EC tablet Take 1 tablet (5 mg total) by  mouth daily as needed for moderate constipation. 02/26/14  Yes Lesle Chris Black, NP  docusate sodium 100 MG CAPS Take 100 mg by mouth 2 (two) times daily. 02/26/14  Yes Lesle Chris Black, NP  donepezil (ARICEPT) 5 MG tablet 1 tab po qhs Patient taking differently: Take 5 mg by mouth at bedtime. 1 tab po qhs 02/26/14  Yes Lesle Chris Black, NP  LORazepam (ATIVAN) 2 MG/ML injection Inject 0.5 mg into the vein daily.   Yes Historical Provider, MD  losartan-hydrochlorothiazide (HYZAAR) 50-12.5 MG per tablet Take 1 tablet by mouth daily.   Yes Historical Provider, MD  Multiple Vitamin (MULTIVITAMIN WITH MINERALS) TABS tablet Take 1  tablet by mouth daily.   Yes Historical Provider, MD  polyethylene glycol (MIRALAX / GLYCOLAX) packet Take 17 g by mouth daily. 02/26/14  Yes Lesle Chris Black, NP  sertraline (ZOLOFT) 100 MG tablet Take 1 tablet (100 mg total) by mouth daily. 10/11/12  Yes Jeoffrey Massed, MD  ALPRAZolam Prudy Feeler) 1 MG tablet 1/2 tab po q 12 NOON and 1 tab po qhs Patient not taking: Reported on 06/12/2016 02/26/14   Gwenyth Bender, NP  fexofenadine (ALLEGRA) 60 MG tablet Take 1 tablet (60 mg total) by mouth 2 (two) times daily. Patient not taking: Reported on 06/12/2016 12/25/13   Jeoffrey Massed, MD  guaifenesin (HUMIBID E) 400 MG TABS tablet Take 1 tablet (400 mg total) by mouth every 8 (eight) hours as needed (for cough). Patient not taking: Reported on 04/17/2014 02/26/14   Gwenyth Bender, NP  methocarbamol (ROBAXIN) 500 MG tablet Take 1 tablet (500 mg total) by mouth every 12 (twelve) hours. For 3 more days. Patient not taking: Reported on 04/17/2014 02/26/14   Gwenyth Bender, NP  omeprazole (PRILOSEC) 40 MG capsule Take 1 capsule (40 mg total) by mouth daily. Patient not taking: Reported on 06/12/2016 08/08/12   Jeoffrey Massed, MD  potassium chloride SA (K-DUR,KLOR-CON) 20 MEQ tablet Take 1 tablet (20 mEq total) by mouth daily. Patient not taking: Reported on 06/12/2016 01/05/13   Jeoffrey Massed, MD  Probiotic Product (ALIGN) 4 MG CAPS 1cap PO at lunch daily for 1 month. Patient not taking: Reported on 02/13/2014 07/31/13   Kelle Darting, NP    Family History No family history on file.  Social History Social History  Substance Use Topics  . Smoking status: Never Smoker  . Smokeless tobacco: Never Used  . Alcohol use No     Allergies   Patient has no known allergies.   Review of Systems Review of Systems  Skin: Positive for wound.  All other systems reviewed and are negative.    Physical Exam Updated Vital Signs BP 100/65 (BP Location: Right Arm)   Pulse 62   Temp 98.1 F (36.7 C) (Oral)   Resp  18   Ht  (1.626 m)   Wt 125 lb (56.7 kg)   SpO2 100%   BMI 21.46 kg/m   Physical Exam  Constitutional: She appears well-developed and well-nourished.  HENT:  Head: Normocephalic.    Right Ear: External ear normal.  Left Ear: External ear normal.  Nose: Nose normal.  Mouth/Throat: Oropharynx is clear and moist.  Eyes: Conjunctivae and EOM are normal. Pupils are equal, round, and reactive to light.  Neck: Normal range of motion. Neck supple.  Cardiovascular: Normal rate, regular rhythm, normal heart sounds and intact distal pulses.   Pulmonary/Chest: Effort normal and breath sounds normal.  Abdominal: Soft. Bowel  sounds are normal.  Musculoskeletal: Normal range of motion.  Neurological: She is alert.  Oriented to person only (normal for pt).  She is moving all 4 extremities.  Skin: Skin is warm.  Psychiatric: She has a normal mood and affect.  Nursing note and vitals reviewed.    ED Treatments / Results  Labs (all labs ordered are listed, but only abnormal results are displayed) Labs Reviewed - No data to display  EKG  EKG Interpretation None       Radiology Dg Pelvis 1-2 Views  Result Date: 06/12/2016 CLINICAL DATA:  83 year old female with a history of fall. Dementia. EXAM: PELVIS - 1-2 VIEW COMPARISON:  02/09/2014, 03/25/2014 FINDINGS: Osteopenia. Surgical changes at the right hip again noted no perihardware fracture. Bilateral hips projects normally over the acetabula. Bilateral hip osteoarthritis. IMPRESSION: Negative for acute fracture, however, osteopenia somewhat limits detection for nondisplaced fractures. If there is ongoing concern, consider MRI as the most sensitive test. Bilateral hip osteoarthritis. Surgical changes at the right hip again noted. Electronically Signed   By: Gilmer Mor D.O.   On: 06/12/2016 10:40   Ct Head Wo Contrast  Result Date: 06/12/2016 CLINICAL DATA:  Dementia.  Larey Seat forward out of wheelchair. EXAM: CT HEAD WITHOUT CONTRAST  TECHNIQUE: Contiguous axial images were obtained from the base of the skull through the vertex without intravenous contrast. COMPARISON:  03/03/2016. FINDINGS: Brain: No evidence for acute infarction, hemorrhage, mass lesion, hydrocephalus, or extra-axial fluid. Advanced atrophy. Extreme brain substance loss in the temporal lobes. Hypoattenuation of white matter representing small vessel disease. Vascular: No hyperdense vessel or unexpected calcification. Skull: Normal. Negative for fracture or focal lesion. Sinuses/Orbits: No acute finding. Other: None. IMPRESSION: Advanced Atrophy and small vessel disease, similar to priors. No acute intracranial findings. No skull fracture or intracranial hemorrhage. Electronically Signed   By: Elsie Stain M.D.   On: 06/12/2016 10:42    Procedures Procedures (including critical care time)  Medications Ordered in ED Medications - No data to display   Initial Impression / Assessment and Plan / ED Course  I have reviewed the triage vital signs and the nursing notes.  Pertinent labs & imaging results that were available during my care of the patient were reviewed by me and considered in my medical decision making (see chart for details).    CT head shows nothing acute.  Pt is at her normal baseline mental status.  She is stable for d/c back to the SNF.  Final Clinical Impressions(s) / ED Diagnoses   Final diagnoses:  Fall, initial encounter  Contusion of face, initial encounter    New Prescriptions New Prescriptions   No medications on file     Jacalyn Lefevre, MD 06/12/16 1052

## 2016-06-12 NOTE — ED Triage Notes (Signed)
Per EMS they were called out to [facility] due to patient falling forward while sitting in a wheelchair.  Pt fell asleep and hit the top of her forehead.  Pt does have an abrasion.  It is unknown if patient lost consciousness.  Pt has a history of dementia and alzheimer's.    Vital signs 140/92 80 heart rate 98% room air

## 2016-06-13 DIAGNOSIS — G309 Alzheimer's disease, unspecified: Secondary | ICD-10-CM | POA: Diagnosis not present

## 2016-06-14 DIAGNOSIS — G309 Alzheimer's disease, unspecified: Secondary | ICD-10-CM | POA: Diagnosis not present

## 2016-06-15 DIAGNOSIS — G309 Alzheimer's disease, unspecified: Secondary | ICD-10-CM | POA: Diagnosis not present

## 2016-06-15 DIAGNOSIS — E559 Vitamin D deficiency, unspecified: Secondary | ICD-10-CM | POA: Diagnosis not present

## 2016-06-15 DIAGNOSIS — Z79899 Other long term (current) drug therapy: Secondary | ICD-10-CM | POA: Diagnosis not present

## 2016-06-15 DIAGNOSIS — E782 Mixed hyperlipidemia: Secondary | ICD-10-CM | POA: Diagnosis not present

## 2016-06-15 DIAGNOSIS — E038 Other specified hypothyroidism: Secondary | ICD-10-CM | POA: Diagnosis not present

## 2016-06-15 DIAGNOSIS — E119 Type 2 diabetes mellitus without complications: Secondary | ICD-10-CM | POA: Diagnosis not present

## 2016-06-15 DIAGNOSIS — D518 Other vitamin B12 deficiency anemias: Secondary | ICD-10-CM | POA: Diagnosis not present

## 2016-06-16 DIAGNOSIS — G309 Alzheimer's disease, unspecified: Secondary | ICD-10-CM | POA: Diagnosis not present

## 2016-06-17 DIAGNOSIS — G309 Alzheimer's disease, unspecified: Secondary | ICD-10-CM | POA: Diagnosis not present

## 2016-06-18 DIAGNOSIS — G309 Alzheimer's disease, unspecified: Secondary | ICD-10-CM | POA: Diagnosis not present

## 2016-06-19 DIAGNOSIS — G309 Alzheimer's disease, unspecified: Secondary | ICD-10-CM | POA: Diagnosis not present

## 2016-06-20 DIAGNOSIS — G309 Alzheimer's disease, unspecified: Secondary | ICD-10-CM | POA: Diagnosis not present

## 2016-06-21 DIAGNOSIS — G309 Alzheimer's disease, unspecified: Secondary | ICD-10-CM | POA: Diagnosis not present

## 2016-06-22 DIAGNOSIS — G309 Alzheimer's disease, unspecified: Secondary | ICD-10-CM | POA: Diagnosis not present

## 2016-06-23 DIAGNOSIS — G309 Alzheimer's disease, unspecified: Secondary | ICD-10-CM | POA: Diagnosis not present

## 2016-06-24 DIAGNOSIS — G309 Alzheimer's disease, unspecified: Secondary | ICD-10-CM | POA: Diagnosis not present

## 2016-06-25 DIAGNOSIS — G309 Alzheimer's disease, unspecified: Secondary | ICD-10-CM | POA: Diagnosis not present

## 2016-06-26 DIAGNOSIS — G309 Alzheimer's disease, unspecified: Secondary | ICD-10-CM | POA: Diagnosis not present

## 2016-06-27 DIAGNOSIS — G309 Alzheimer's disease, unspecified: Secondary | ICD-10-CM | POA: Diagnosis not present

## 2016-06-28 DIAGNOSIS — G309 Alzheimer's disease, unspecified: Secondary | ICD-10-CM | POA: Diagnosis not present

## 2016-06-29 DIAGNOSIS — G309 Alzheimer's disease, unspecified: Secondary | ICD-10-CM | POA: Diagnosis not present

## 2016-06-30 DIAGNOSIS — G309 Alzheimer's disease, unspecified: Secondary | ICD-10-CM | POA: Diagnosis not present

## 2016-07-01 DIAGNOSIS — G309 Alzheimer's disease, unspecified: Secondary | ICD-10-CM | POA: Diagnosis not present

## 2016-07-02 DIAGNOSIS — G309 Alzheimer's disease, unspecified: Secondary | ICD-10-CM | POA: Diagnosis not present

## 2016-07-03 DIAGNOSIS — G309 Alzheimer's disease, unspecified: Secondary | ICD-10-CM | POA: Diagnosis not present

## 2016-07-04 DIAGNOSIS — G309 Alzheimer's disease, unspecified: Secondary | ICD-10-CM | POA: Diagnosis not present

## 2016-07-05 DIAGNOSIS — G309 Alzheimer's disease, unspecified: Secondary | ICD-10-CM | POA: Diagnosis not present

## 2016-07-06 DIAGNOSIS — G309 Alzheimer's disease, unspecified: Secondary | ICD-10-CM | POA: Diagnosis not present

## 2016-07-07 DIAGNOSIS — G309 Alzheimer's disease, unspecified: Secondary | ICD-10-CM | POA: Diagnosis not present

## 2016-07-08 DIAGNOSIS — K5900 Constipation, unspecified: Secondary | ICD-10-CM | POA: Diagnosis not present

## 2016-07-08 DIAGNOSIS — N183 Chronic kidney disease, stage 3 (moderate): Secondary | ICD-10-CM | POA: Diagnosis not present

## 2016-07-08 DIAGNOSIS — I10 Essential (primary) hypertension: Secondary | ICD-10-CM | POA: Diagnosis not present

## 2016-07-08 DIAGNOSIS — G309 Alzheimer's disease, unspecified: Secondary | ICD-10-CM | POA: Diagnosis not present

## 2016-07-09 DIAGNOSIS — G309 Alzheimer's disease, unspecified: Secondary | ICD-10-CM | POA: Diagnosis not present

## 2016-07-09 DIAGNOSIS — R32 Unspecified urinary incontinence: Secondary | ICD-10-CM | POA: Diagnosis not present

## 2016-07-10 DIAGNOSIS — G309 Alzheimer's disease, unspecified: Secondary | ICD-10-CM | POA: Diagnosis not present

## 2016-07-11 DIAGNOSIS — G309 Alzheimer's disease, unspecified: Secondary | ICD-10-CM | POA: Diagnosis not present

## 2016-07-12 DIAGNOSIS — G309 Alzheimer's disease, unspecified: Secondary | ICD-10-CM | POA: Diagnosis not present

## 2016-07-13 DIAGNOSIS — G309 Alzheimer's disease, unspecified: Secondary | ICD-10-CM | POA: Diagnosis not present

## 2016-07-14 DIAGNOSIS — R451 Restlessness and agitation: Secondary | ICD-10-CM | POA: Diagnosis not present

## 2016-07-14 DIAGNOSIS — F0630 Mood disorder due to known physiological condition, unspecified: Secondary | ICD-10-CM | POA: Diagnosis not present

## 2016-07-14 DIAGNOSIS — G309 Alzheimer's disease, unspecified: Secondary | ICD-10-CM | POA: Diagnosis not present

## 2016-07-14 DIAGNOSIS — F419 Anxiety disorder, unspecified: Secondary | ICD-10-CM | POA: Diagnosis not present

## 2016-07-14 DIAGNOSIS — F0391 Unspecified dementia with behavioral disturbance: Secondary | ICD-10-CM | POA: Diagnosis not present

## 2016-07-15 DIAGNOSIS — G309 Alzheimer's disease, unspecified: Secondary | ICD-10-CM | POA: Diagnosis not present

## 2016-07-16 DIAGNOSIS — G309 Alzheimer's disease, unspecified: Secondary | ICD-10-CM | POA: Diagnosis not present

## 2016-07-17 DIAGNOSIS — G309 Alzheimer's disease, unspecified: Secondary | ICD-10-CM | POA: Diagnosis not present

## 2016-07-18 DIAGNOSIS — G309 Alzheimer's disease, unspecified: Secondary | ICD-10-CM | POA: Diagnosis not present

## 2016-07-19 DIAGNOSIS — G309 Alzheimer's disease, unspecified: Secondary | ICD-10-CM | POA: Diagnosis not present

## 2016-07-20 DIAGNOSIS — G309 Alzheimer's disease, unspecified: Secondary | ICD-10-CM | POA: Diagnosis not present

## 2016-07-21 DIAGNOSIS — G309 Alzheimer's disease, unspecified: Secondary | ICD-10-CM | POA: Diagnosis not present

## 2016-07-22 DIAGNOSIS — G309 Alzheimer's disease, unspecified: Secondary | ICD-10-CM | POA: Diagnosis not present

## 2016-07-23 DIAGNOSIS — G309 Alzheimer's disease, unspecified: Secondary | ICD-10-CM | POA: Diagnosis not present

## 2016-07-24 DIAGNOSIS — G309 Alzheimer's disease, unspecified: Secondary | ICD-10-CM | POA: Diagnosis not present

## 2016-07-25 DIAGNOSIS — G309 Alzheimer's disease, unspecified: Secondary | ICD-10-CM | POA: Diagnosis not present

## 2016-07-26 DIAGNOSIS — G309 Alzheimer's disease, unspecified: Secondary | ICD-10-CM | POA: Diagnosis not present

## 2016-07-27 DIAGNOSIS — G309 Alzheimer's disease, unspecified: Secondary | ICD-10-CM | POA: Diagnosis not present

## 2016-07-28 DIAGNOSIS — G309 Alzheimer's disease, unspecified: Secondary | ICD-10-CM | POA: Diagnosis not present

## 2016-07-29 DIAGNOSIS — G309 Alzheimer's disease, unspecified: Secondary | ICD-10-CM | POA: Diagnosis not present

## 2016-07-30 DIAGNOSIS — G309 Alzheimer's disease, unspecified: Secondary | ICD-10-CM | POA: Diagnosis not present

## 2016-07-31 DIAGNOSIS — G309 Alzheimer's disease, unspecified: Secondary | ICD-10-CM | POA: Diagnosis not present

## 2016-08-01 DIAGNOSIS — G309 Alzheimer's disease, unspecified: Secondary | ICD-10-CM | POA: Diagnosis not present

## 2016-08-02 DIAGNOSIS — G309 Alzheimer's disease, unspecified: Secondary | ICD-10-CM | POA: Diagnosis not present

## 2016-08-03 DIAGNOSIS — G309 Alzheimer's disease, unspecified: Secondary | ICD-10-CM | POA: Diagnosis not present

## 2016-08-04 DIAGNOSIS — G309 Alzheimer's disease, unspecified: Secondary | ICD-10-CM | POA: Diagnosis not present

## 2016-08-05 DIAGNOSIS — I10 Essential (primary) hypertension: Secondary | ICD-10-CM | POA: Diagnosis not present

## 2016-08-05 DIAGNOSIS — K219 Gastro-esophageal reflux disease without esophagitis: Secondary | ICD-10-CM | POA: Diagnosis not present

## 2016-08-05 DIAGNOSIS — N183 Chronic kidney disease, stage 3 (moderate): Secondary | ICD-10-CM | POA: Diagnosis not present

## 2016-08-05 DIAGNOSIS — G309 Alzheimer's disease, unspecified: Secondary | ICD-10-CM | POA: Diagnosis not present

## 2016-08-06 DIAGNOSIS — G309 Alzheimer's disease, unspecified: Secondary | ICD-10-CM | POA: Diagnosis not present

## 2016-08-07 DIAGNOSIS — G309 Alzheimer's disease, unspecified: Secondary | ICD-10-CM | POA: Diagnosis not present

## 2016-08-08 DIAGNOSIS — G309 Alzheimer's disease, unspecified: Secondary | ICD-10-CM | POA: Diagnosis not present

## 2016-08-09 DIAGNOSIS — G309 Alzheimer's disease, unspecified: Secondary | ICD-10-CM | POA: Diagnosis not present

## 2016-08-10 DIAGNOSIS — G309 Alzheimer's disease, unspecified: Secondary | ICD-10-CM | POA: Diagnosis not present

## 2016-08-10 DIAGNOSIS — R451 Restlessness and agitation: Secondary | ICD-10-CM | POA: Diagnosis not present

## 2016-08-10 DIAGNOSIS — F419 Anxiety disorder, unspecified: Secondary | ICD-10-CM | POA: Diagnosis not present

## 2016-08-10 DIAGNOSIS — F0630 Mood disorder due to known physiological condition, unspecified: Secondary | ICD-10-CM | POA: Diagnosis not present

## 2016-08-10 DIAGNOSIS — F0391 Unspecified dementia with behavioral disturbance: Secondary | ICD-10-CM | POA: Diagnosis not present

## 2016-08-11 DIAGNOSIS — G309 Alzheimer's disease, unspecified: Secondary | ICD-10-CM | POA: Diagnosis not present

## 2016-08-12 DIAGNOSIS — G309 Alzheimer's disease, unspecified: Secondary | ICD-10-CM | POA: Diagnosis not present

## 2016-08-13 DIAGNOSIS — G309 Alzheimer's disease, unspecified: Secondary | ICD-10-CM | POA: Diagnosis not present

## 2016-08-14 DIAGNOSIS — G309 Alzheimer's disease, unspecified: Secondary | ICD-10-CM | POA: Diagnosis not present

## 2016-08-15 DIAGNOSIS — G309 Alzheimer's disease, unspecified: Secondary | ICD-10-CM | POA: Diagnosis not present

## 2016-08-16 DIAGNOSIS — G309 Alzheimer's disease, unspecified: Secondary | ICD-10-CM | POA: Diagnosis not present

## 2016-08-17 DIAGNOSIS — G309 Alzheimer's disease, unspecified: Secondary | ICD-10-CM | POA: Diagnosis not present

## 2016-08-18 DIAGNOSIS — G309 Alzheimer's disease, unspecified: Secondary | ICD-10-CM | POA: Diagnosis not present

## 2016-08-19 DIAGNOSIS — G309 Alzheimer's disease, unspecified: Secondary | ICD-10-CM | POA: Diagnosis not present

## 2016-08-20 DIAGNOSIS — G309 Alzheimer's disease, unspecified: Secondary | ICD-10-CM | POA: Diagnosis not present

## 2016-08-21 DIAGNOSIS — G309 Alzheimer's disease, unspecified: Secondary | ICD-10-CM | POA: Diagnosis not present

## 2016-08-22 DIAGNOSIS — G309 Alzheimer's disease, unspecified: Secondary | ICD-10-CM | POA: Diagnosis not present

## 2016-08-23 DIAGNOSIS — G309 Alzheimer's disease, unspecified: Secondary | ICD-10-CM | POA: Diagnosis not present

## 2016-08-24 DIAGNOSIS — G309 Alzheimer's disease, unspecified: Secondary | ICD-10-CM | POA: Diagnosis not present

## 2016-08-25 DIAGNOSIS — G309 Alzheimer's disease, unspecified: Secondary | ICD-10-CM | POA: Diagnosis not present

## 2016-08-26 DIAGNOSIS — G309 Alzheimer's disease, unspecified: Secondary | ICD-10-CM | POA: Diagnosis not present

## 2016-08-27 DIAGNOSIS — G309 Alzheimer's disease, unspecified: Secondary | ICD-10-CM | POA: Diagnosis not present

## 2016-08-28 DIAGNOSIS — G309 Alzheimer's disease, unspecified: Secondary | ICD-10-CM | POA: Diagnosis not present

## 2016-08-29 DIAGNOSIS — G309 Alzheimer's disease, unspecified: Secondary | ICD-10-CM | POA: Diagnosis not present

## 2016-08-30 DIAGNOSIS — G309 Alzheimer's disease, unspecified: Secondary | ICD-10-CM | POA: Diagnosis not present

## 2016-08-31 DIAGNOSIS — G309 Alzheimer's disease, unspecified: Secondary | ICD-10-CM | POA: Diagnosis not present

## 2016-09-01 DIAGNOSIS — G309 Alzheimer's disease, unspecified: Secondary | ICD-10-CM | POA: Diagnosis not present

## 2016-09-02 DIAGNOSIS — G309 Alzheimer's disease, unspecified: Secondary | ICD-10-CM | POA: Diagnosis not present

## 2016-09-03 DIAGNOSIS — G309 Alzheimer's disease, unspecified: Secondary | ICD-10-CM | POA: Diagnosis not present

## 2016-09-04 DIAGNOSIS — G309 Alzheimer's disease, unspecified: Secondary | ICD-10-CM | POA: Diagnosis not present

## 2016-09-05 DIAGNOSIS — G309 Alzheimer's disease, unspecified: Secondary | ICD-10-CM | POA: Diagnosis not present

## 2016-09-06 DIAGNOSIS — G309 Alzheimer's disease, unspecified: Secondary | ICD-10-CM | POA: Diagnosis not present

## 2016-09-07 DIAGNOSIS — F0630 Mood disorder due to known physiological condition, unspecified: Secondary | ICD-10-CM | POA: Diagnosis not present

## 2016-09-07 DIAGNOSIS — G309 Alzheimer's disease, unspecified: Secondary | ICD-10-CM | POA: Diagnosis not present

## 2016-09-07 DIAGNOSIS — R451 Restlessness and agitation: Secondary | ICD-10-CM | POA: Diagnosis not present

## 2016-09-07 DIAGNOSIS — F0391 Unspecified dementia with behavioral disturbance: Secondary | ICD-10-CM | POA: Diagnosis not present

## 2016-09-07 DIAGNOSIS — F419 Anxiety disorder, unspecified: Secondary | ICD-10-CM | POA: Diagnosis not present

## 2016-09-08 DIAGNOSIS — G309 Alzheimer's disease, unspecified: Secondary | ICD-10-CM | POA: Diagnosis not present

## 2016-09-09 DIAGNOSIS — G309 Alzheimer's disease, unspecified: Secondary | ICD-10-CM | POA: Diagnosis not present

## 2016-09-10 DIAGNOSIS — G309 Alzheimer's disease, unspecified: Secondary | ICD-10-CM | POA: Diagnosis not present

## 2016-09-11 DIAGNOSIS — G309 Alzheimer's disease, unspecified: Secondary | ICD-10-CM | POA: Diagnosis not present

## 2016-09-12 DIAGNOSIS — G309 Alzheimer's disease, unspecified: Secondary | ICD-10-CM | POA: Diagnosis not present

## 2016-09-13 DIAGNOSIS — G309 Alzheimer's disease, unspecified: Secondary | ICD-10-CM | POA: Diagnosis not present

## 2016-09-14 DIAGNOSIS — E559 Vitamin D deficiency, unspecified: Secondary | ICD-10-CM | POA: Diagnosis not present

## 2016-09-14 DIAGNOSIS — G309 Alzheimer's disease, unspecified: Secondary | ICD-10-CM | POA: Diagnosis not present

## 2016-09-14 DIAGNOSIS — E1165 Type 2 diabetes mellitus with hyperglycemia: Secondary | ICD-10-CM | POA: Diagnosis not present

## 2016-09-14 DIAGNOSIS — Z79899 Other long term (current) drug therapy: Secondary | ICD-10-CM | POA: Diagnosis not present

## 2016-09-14 DIAGNOSIS — E782 Mixed hyperlipidemia: Secondary | ICD-10-CM | POA: Diagnosis not present

## 2016-09-14 DIAGNOSIS — D518 Other vitamin B12 deficiency anemias: Secondary | ICD-10-CM | POA: Diagnosis not present

## 2016-09-14 DIAGNOSIS — E038 Other specified hypothyroidism: Secondary | ICD-10-CM | POA: Diagnosis not present

## 2016-09-15 DIAGNOSIS — G309 Alzheimer's disease, unspecified: Secondary | ICD-10-CM | POA: Diagnosis not present

## 2016-09-16 DIAGNOSIS — G309 Alzheimer's disease, unspecified: Secondary | ICD-10-CM | POA: Diagnosis not present

## 2016-09-17 DIAGNOSIS — G309 Alzheimer's disease, unspecified: Secondary | ICD-10-CM | POA: Diagnosis not present

## 2016-09-18 DIAGNOSIS — G309 Alzheimer's disease, unspecified: Secondary | ICD-10-CM | POA: Diagnosis not present

## 2016-09-19 DIAGNOSIS — G309 Alzheimer's disease, unspecified: Secondary | ICD-10-CM | POA: Diagnosis not present

## 2016-09-20 DIAGNOSIS — G309 Alzheimer's disease, unspecified: Secondary | ICD-10-CM | POA: Diagnosis not present

## 2016-09-21 DIAGNOSIS — G309 Alzheimer's disease, unspecified: Secondary | ICD-10-CM | POA: Diagnosis not present

## 2016-09-22 DIAGNOSIS — G309 Alzheimer's disease, unspecified: Secondary | ICD-10-CM | POA: Diagnosis not present

## 2016-09-23 DIAGNOSIS — G309 Alzheimer's disease, unspecified: Secondary | ICD-10-CM | POA: Diagnosis not present

## 2016-09-24 DIAGNOSIS — G309 Alzheimer's disease, unspecified: Secondary | ICD-10-CM | POA: Diagnosis not present

## 2016-09-25 DIAGNOSIS — G309 Alzheimer's disease, unspecified: Secondary | ICD-10-CM | POA: Diagnosis not present

## 2016-09-26 DIAGNOSIS — G309 Alzheimer's disease, unspecified: Secondary | ICD-10-CM | POA: Diagnosis not present

## 2016-09-27 DIAGNOSIS — G309 Alzheimer's disease, unspecified: Secondary | ICD-10-CM | POA: Diagnosis not present

## 2016-09-28 DIAGNOSIS — G309 Alzheimer's disease, unspecified: Secondary | ICD-10-CM | POA: Diagnosis not present

## 2016-09-29 DIAGNOSIS — G309 Alzheimer's disease, unspecified: Secondary | ICD-10-CM | POA: Diagnosis not present

## 2016-09-30 DIAGNOSIS — G309 Alzheimer's disease, unspecified: Secondary | ICD-10-CM | POA: Diagnosis not present

## 2016-10-01 DIAGNOSIS — G309 Alzheimer's disease, unspecified: Secondary | ICD-10-CM | POA: Diagnosis not present

## 2016-10-02 DIAGNOSIS — G309 Alzheimer's disease, unspecified: Secondary | ICD-10-CM | POA: Diagnosis not present

## 2016-10-03 DIAGNOSIS — G309 Alzheimer's disease, unspecified: Secondary | ICD-10-CM | POA: Diagnosis not present

## 2016-10-04 DIAGNOSIS — G309 Alzheimer's disease, unspecified: Secondary | ICD-10-CM | POA: Diagnosis not present

## 2016-10-05 DIAGNOSIS — G309 Alzheimer's disease, unspecified: Secondary | ICD-10-CM | POA: Diagnosis not present

## 2016-10-06 DIAGNOSIS — G309 Alzheimer's disease, unspecified: Secondary | ICD-10-CM | POA: Diagnosis not present

## 2016-10-07 DIAGNOSIS — G309 Alzheimer's disease, unspecified: Secondary | ICD-10-CM | POA: Diagnosis not present

## 2016-10-08 DIAGNOSIS — G309 Alzheimer's disease, unspecified: Secondary | ICD-10-CM | POA: Diagnosis not present

## 2016-10-09 DIAGNOSIS — G309 Alzheimer's disease, unspecified: Secondary | ICD-10-CM | POA: Diagnosis not present

## 2016-10-10 DIAGNOSIS — G309 Alzheimer's disease, unspecified: Secondary | ICD-10-CM | POA: Diagnosis not present

## 2016-10-11 DIAGNOSIS — G309 Alzheimer's disease, unspecified: Secondary | ICD-10-CM | POA: Diagnosis not present

## 2016-10-12 DIAGNOSIS — G309 Alzheimer's disease, unspecified: Secondary | ICD-10-CM | POA: Diagnosis not present

## 2016-10-12 DIAGNOSIS — F419 Anxiety disorder, unspecified: Secondary | ICD-10-CM | POA: Diagnosis not present

## 2016-10-12 DIAGNOSIS — F0391 Unspecified dementia with behavioral disturbance: Secondary | ICD-10-CM | POA: Diagnosis not present

## 2016-10-12 DIAGNOSIS — R451 Restlessness and agitation: Secondary | ICD-10-CM | POA: Diagnosis not present

## 2016-10-12 DIAGNOSIS — F0630 Mood disorder due to known physiological condition, unspecified: Secondary | ICD-10-CM | POA: Diagnosis not present

## 2016-10-12 DIAGNOSIS — R32 Unspecified urinary incontinence: Secondary | ICD-10-CM | POA: Diagnosis not present

## 2016-10-13 DIAGNOSIS — G309 Alzheimer's disease, unspecified: Secondary | ICD-10-CM | POA: Diagnosis not present

## 2016-10-14 DIAGNOSIS — G309 Alzheimer's disease, unspecified: Secondary | ICD-10-CM | POA: Diagnosis not present

## 2016-10-14 DIAGNOSIS — M1990 Unspecified osteoarthritis, unspecified site: Secondary | ICD-10-CM | POA: Diagnosis not present

## 2016-10-14 DIAGNOSIS — K5900 Constipation, unspecified: Secondary | ICD-10-CM | POA: Diagnosis not present

## 2016-10-14 DIAGNOSIS — I10 Essential (primary) hypertension: Secondary | ICD-10-CM | POA: Diagnosis not present

## 2016-10-15 DIAGNOSIS — G309 Alzheimer's disease, unspecified: Secondary | ICD-10-CM | POA: Diagnosis not present

## 2016-10-16 DIAGNOSIS — G309 Alzheimer's disease, unspecified: Secondary | ICD-10-CM | POA: Diagnosis not present

## 2016-10-17 DIAGNOSIS — G309 Alzheimer's disease, unspecified: Secondary | ICD-10-CM | POA: Diagnosis not present

## 2016-10-18 DIAGNOSIS — G309 Alzheimer's disease, unspecified: Secondary | ICD-10-CM | POA: Diagnosis not present

## 2016-10-19 DIAGNOSIS — G309 Alzheimer's disease, unspecified: Secondary | ICD-10-CM | POA: Diagnosis not present

## 2016-10-20 DIAGNOSIS — G309 Alzheimer's disease, unspecified: Secondary | ICD-10-CM | POA: Diagnosis not present

## 2016-10-21 DIAGNOSIS — G309 Alzheimer's disease, unspecified: Secondary | ICD-10-CM | POA: Diagnosis not present

## 2016-10-22 DIAGNOSIS — G309 Alzheimer's disease, unspecified: Secondary | ICD-10-CM | POA: Diagnosis not present

## 2016-10-23 DIAGNOSIS — G309 Alzheimer's disease, unspecified: Secondary | ICD-10-CM | POA: Diagnosis not present

## 2016-10-24 DIAGNOSIS — G309 Alzheimer's disease, unspecified: Secondary | ICD-10-CM | POA: Diagnosis not present

## 2016-10-25 DIAGNOSIS — G309 Alzheimer's disease, unspecified: Secondary | ICD-10-CM | POA: Diagnosis not present

## 2016-10-26 DIAGNOSIS — G309 Alzheimer's disease, unspecified: Secondary | ICD-10-CM | POA: Diagnosis not present

## 2016-10-27 DIAGNOSIS — G309 Alzheimer's disease, unspecified: Secondary | ICD-10-CM | POA: Diagnosis not present

## 2016-10-28 DIAGNOSIS — G309 Alzheimer's disease, unspecified: Secondary | ICD-10-CM | POA: Diagnosis not present

## 2016-10-29 DIAGNOSIS — G309 Alzheimer's disease, unspecified: Secondary | ICD-10-CM | POA: Diagnosis not present

## 2016-10-30 DIAGNOSIS — G309 Alzheimer's disease, unspecified: Secondary | ICD-10-CM | POA: Diagnosis not present

## 2016-10-31 DIAGNOSIS — G309 Alzheimer's disease, unspecified: Secondary | ICD-10-CM | POA: Diagnosis not present

## 2016-11-01 DIAGNOSIS — G309 Alzheimer's disease, unspecified: Secondary | ICD-10-CM | POA: Diagnosis not present

## 2016-11-02 DIAGNOSIS — G309 Alzheimer's disease, unspecified: Secondary | ICD-10-CM | POA: Diagnosis not present

## 2016-11-03 DIAGNOSIS — G309 Alzheimer's disease, unspecified: Secondary | ICD-10-CM | POA: Diagnosis not present

## 2016-11-04 DIAGNOSIS — G309 Alzheimer's disease, unspecified: Secondary | ICD-10-CM | POA: Diagnosis not present

## 2016-11-05 DIAGNOSIS — G309 Alzheimer's disease, unspecified: Secondary | ICD-10-CM | POA: Diagnosis not present

## 2016-11-06 DIAGNOSIS — G309 Alzheimer's disease, unspecified: Secondary | ICD-10-CM | POA: Diagnosis not present

## 2016-11-07 DIAGNOSIS — G309 Alzheimer's disease, unspecified: Secondary | ICD-10-CM | POA: Diagnosis not present

## 2016-11-08 DIAGNOSIS — G309 Alzheimer's disease, unspecified: Secondary | ICD-10-CM | POA: Diagnosis not present

## 2016-11-09 DIAGNOSIS — G309 Alzheimer's disease, unspecified: Secondary | ICD-10-CM | POA: Diagnosis not present

## 2016-11-10 DIAGNOSIS — R32 Unspecified urinary incontinence: Secondary | ICD-10-CM | POA: Diagnosis not present

## 2016-11-10 DIAGNOSIS — G309 Alzheimer's disease, unspecified: Secondary | ICD-10-CM | POA: Diagnosis not present

## 2016-11-11 DIAGNOSIS — G309 Alzheimer's disease, unspecified: Secondary | ICD-10-CM | POA: Diagnosis not present

## 2016-11-12 DIAGNOSIS — F0630 Mood disorder due to known physiological condition, unspecified: Secondary | ICD-10-CM | POA: Diagnosis not present

## 2016-11-12 DIAGNOSIS — G309 Alzheimer's disease, unspecified: Secondary | ICD-10-CM | POA: Diagnosis not present

## 2016-11-12 DIAGNOSIS — F419 Anxiety disorder, unspecified: Secondary | ICD-10-CM | POA: Diagnosis not present

## 2016-11-12 DIAGNOSIS — R451 Restlessness and agitation: Secondary | ICD-10-CM | POA: Diagnosis not present

## 2016-11-12 DIAGNOSIS — F0391 Unspecified dementia with behavioral disturbance: Secondary | ICD-10-CM | POA: Diagnosis not present

## 2016-11-13 DIAGNOSIS — G309 Alzheimer's disease, unspecified: Secondary | ICD-10-CM | POA: Diagnosis not present

## 2016-11-14 DIAGNOSIS — G309 Alzheimer's disease, unspecified: Secondary | ICD-10-CM | POA: Diagnosis not present

## 2016-11-15 DIAGNOSIS — G309 Alzheimer's disease, unspecified: Secondary | ICD-10-CM | POA: Diagnosis not present

## 2016-11-16 DIAGNOSIS — G309 Alzheimer's disease, unspecified: Secondary | ICD-10-CM | POA: Diagnosis not present

## 2016-11-17 DIAGNOSIS — G309 Alzheimer's disease, unspecified: Secondary | ICD-10-CM | POA: Diagnosis not present

## 2016-11-18 DIAGNOSIS — G309 Alzheimer's disease, unspecified: Secondary | ICD-10-CM | POA: Diagnosis not present

## 2016-11-19 DIAGNOSIS — G309 Alzheimer's disease, unspecified: Secondary | ICD-10-CM | POA: Diagnosis not present

## 2016-11-20 DIAGNOSIS — G309 Alzheimer's disease, unspecified: Secondary | ICD-10-CM | POA: Diagnosis not present

## 2016-11-21 DIAGNOSIS — G309 Alzheimer's disease, unspecified: Secondary | ICD-10-CM | POA: Diagnosis not present

## 2016-11-22 DIAGNOSIS — G309 Alzheimer's disease, unspecified: Secondary | ICD-10-CM | POA: Diagnosis not present

## 2016-11-23 DIAGNOSIS — G309 Alzheimer's disease, unspecified: Secondary | ICD-10-CM | POA: Diagnosis not present

## 2016-11-24 DIAGNOSIS — E119 Type 2 diabetes mellitus without complications: Secondary | ICD-10-CM | POA: Diagnosis not present

## 2016-11-24 DIAGNOSIS — L84 Corns and callosities: Secondary | ICD-10-CM | POA: Diagnosis not present

## 2016-11-24 DIAGNOSIS — M79675 Pain in left toe(s): Secondary | ICD-10-CM | POA: Diagnosis not present

## 2016-11-24 DIAGNOSIS — B351 Tinea unguium: Secondary | ICD-10-CM | POA: Diagnosis not present

## 2016-11-24 DIAGNOSIS — G309 Alzheimer's disease, unspecified: Secondary | ICD-10-CM | POA: Diagnosis not present

## 2016-11-25 DIAGNOSIS — Z79899 Other long term (current) drug therapy: Secondary | ICD-10-CM | POA: Diagnosis not present

## 2016-11-25 DIAGNOSIS — G309 Alzheimer's disease, unspecified: Secondary | ICD-10-CM | POA: Diagnosis not present

## 2016-11-26 DIAGNOSIS — G309 Alzheimer's disease, unspecified: Secondary | ICD-10-CM | POA: Diagnosis not present

## 2016-11-27 DIAGNOSIS — G309 Alzheimer's disease, unspecified: Secondary | ICD-10-CM | POA: Diagnosis not present

## 2016-11-28 DIAGNOSIS — G309 Alzheimer's disease, unspecified: Secondary | ICD-10-CM | POA: Diagnosis not present

## 2016-11-29 DIAGNOSIS — G309 Alzheimer's disease, unspecified: Secondary | ICD-10-CM | POA: Diagnosis not present

## 2016-11-30 DIAGNOSIS — G309 Alzheimer's disease, unspecified: Secondary | ICD-10-CM | POA: Diagnosis not present

## 2016-12-01 DIAGNOSIS — G309 Alzheimer's disease, unspecified: Secondary | ICD-10-CM | POA: Diagnosis not present

## 2016-12-02 DIAGNOSIS — G309 Alzheimer's disease, unspecified: Secondary | ICD-10-CM | POA: Diagnosis not present

## 2016-12-03 DIAGNOSIS — G309 Alzheimer's disease, unspecified: Secondary | ICD-10-CM | POA: Diagnosis not present

## 2016-12-04 DIAGNOSIS — G309 Alzheimer's disease, unspecified: Secondary | ICD-10-CM | POA: Diagnosis not present

## 2016-12-05 DIAGNOSIS — G309 Alzheimer's disease, unspecified: Secondary | ICD-10-CM | POA: Diagnosis not present

## 2016-12-06 DIAGNOSIS — G309 Alzheimer's disease, unspecified: Secondary | ICD-10-CM | POA: Diagnosis not present

## 2016-12-07 DIAGNOSIS — G309 Alzheimer's disease, unspecified: Secondary | ICD-10-CM | POA: Diagnosis not present

## 2016-12-08 DIAGNOSIS — G309 Alzheimer's disease, unspecified: Secondary | ICD-10-CM | POA: Diagnosis not present

## 2016-12-09 DIAGNOSIS — R32 Unspecified urinary incontinence: Secondary | ICD-10-CM | POA: Diagnosis not present

## 2016-12-09 DIAGNOSIS — G309 Alzheimer's disease, unspecified: Secondary | ICD-10-CM | POA: Diagnosis not present

## 2016-12-10 DIAGNOSIS — G309 Alzheimer's disease, unspecified: Secondary | ICD-10-CM | POA: Diagnosis not present

## 2016-12-11 DIAGNOSIS — G309 Alzheimer's disease, unspecified: Secondary | ICD-10-CM | POA: Diagnosis not present

## 2016-12-12 DIAGNOSIS — G309 Alzheimer's disease, unspecified: Secondary | ICD-10-CM | POA: Diagnosis not present

## 2016-12-13 DIAGNOSIS — G309 Alzheimer's disease, unspecified: Secondary | ICD-10-CM | POA: Diagnosis not present

## 2016-12-14 DIAGNOSIS — G309 Alzheimer's disease, unspecified: Secondary | ICD-10-CM | POA: Diagnosis not present

## 2016-12-15 DIAGNOSIS — G309 Alzheimer's disease, unspecified: Secondary | ICD-10-CM | POA: Diagnosis not present

## 2016-12-16 DIAGNOSIS — G309 Alzheimer's disease, unspecified: Secondary | ICD-10-CM | POA: Diagnosis not present

## 2016-12-16 DIAGNOSIS — F0391 Unspecified dementia with behavioral disturbance: Secondary | ICD-10-CM | POA: Diagnosis not present

## 2016-12-16 DIAGNOSIS — F419 Anxiety disorder, unspecified: Secondary | ICD-10-CM | POA: Diagnosis not present

## 2016-12-16 DIAGNOSIS — R451 Restlessness and agitation: Secondary | ICD-10-CM | POA: Diagnosis not present

## 2016-12-16 DIAGNOSIS — F0630 Mood disorder due to known physiological condition, unspecified: Secondary | ICD-10-CM | POA: Diagnosis not present

## 2016-12-17 DIAGNOSIS — G309 Alzheimer's disease, unspecified: Secondary | ICD-10-CM | POA: Diagnosis not present

## 2016-12-18 DIAGNOSIS — G309 Alzheimer's disease, unspecified: Secondary | ICD-10-CM | POA: Diagnosis not present

## 2016-12-19 DIAGNOSIS — G309 Alzheimer's disease, unspecified: Secondary | ICD-10-CM | POA: Diagnosis not present

## 2016-12-20 DIAGNOSIS — G309 Alzheimer's disease, unspecified: Secondary | ICD-10-CM | POA: Diagnosis not present

## 2016-12-21 DIAGNOSIS — G309 Alzheimer's disease, unspecified: Secondary | ICD-10-CM | POA: Diagnosis not present

## 2016-12-22 DIAGNOSIS — G309 Alzheimer's disease, unspecified: Secondary | ICD-10-CM | POA: Diagnosis not present

## 2016-12-23 DIAGNOSIS — G309 Alzheimer's disease, unspecified: Secondary | ICD-10-CM | POA: Diagnosis not present

## 2016-12-24 DIAGNOSIS — G309 Alzheimer's disease, unspecified: Secondary | ICD-10-CM | POA: Diagnosis not present

## 2016-12-25 DIAGNOSIS — G309 Alzheimer's disease, unspecified: Secondary | ICD-10-CM | POA: Diagnosis not present

## 2016-12-26 DIAGNOSIS — G309 Alzheimer's disease, unspecified: Secondary | ICD-10-CM | POA: Diagnosis not present

## 2016-12-27 DIAGNOSIS — G309 Alzheimer's disease, unspecified: Secondary | ICD-10-CM | POA: Diagnosis not present

## 2016-12-28 DIAGNOSIS — G309 Alzheimer's disease, unspecified: Secondary | ICD-10-CM | POA: Diagnosis not present

## 2016-12-29 DIAGNOSIS — G309 Alzheimer's disease, unspecified: Secondary | ICD-10-CM | POA: Diagnosis not present

## 2016-12-30 DIAGNOSIS — G309 Alzheimer's disease, unspecified: Secondary | ICD-10-CM | POA: Diagnosis not present

## 2016-12-31 DIAGNOSIS — G309 Alzheimer's disease, unspecified: Secondary | ICD-10-CM | POA: Diagnosis not present

## 2017-01-01 DIAGNOSIS — G309 Alzheimer's disease, unspecified: Secondary | ICD-10-CM | POA: Diagnosis not present

## 2017-01-02 DIAGNOSIS — G309 Alzheimer's disease, unspecified: Secondary | ICD-10-CM | POA: Diagnosis not present

## 2017-01-03 DIAGNOSIS — G309 Alzheimer's disease, unspecified: Secondary | ICD-10-CM | POA: Diagnosis not present

## 2017-01-04 DIAGNOSIS — G309 Alzheimer's disease, unspecified: Secondary | ICD-10-CM | POA: Diagnosis not present

## 2017-01-05 DIAGNOSIS — G309 Alzheimer's disease, unspecified: Secondary | ICD-10-CM | POA: Diagnosis not present

## 2017-01-06 DIAGNOSIS — G309 Alzheimer's disease, unspecified: Secondary | ICD-10-CM | POA: Diagnosis not present

## 2017-01-07 DIAGNOSIS — G309 Alzheimer's disease, unspecified: Secondary | ICD-10-CM | POA: Diagnosis not present

## 2017-01-08 DIAGNOSIS — G309 Alzheimer's disease, unspecified: Secondary | ICD-10-CM | POA: Diagnosis not present

## 2017-01-09 DIAGNOSIS — G309 Alzheimer's disease, unspecified: Secondary | ICD-10-CM | POA: Diagnosis not present

## 2017-01-10 DIAGNOSIS — G309 Alzheimer's disease, unspecified: Secondary | ICD-10-CM | POA: Diagnosis not present

## 2017-01-11 DIAGNOSIS — R32 Unspecified urinary incontinence: Secondary | ICD-10-CM | POA: Diagnosis not present

## 2017-01-11 DIAGNOSIS — G309 Alzheimer's disease, unspecified: Secondary | ICD-10-CM | POA: Diagnosis not present

## 2017-01-12 DIAGNOSIS — G309 Alzheimer's disease, unspecified: Secondary | ICD-10-CM | POA: Diagnosis not present

## 2017-01-13 DIAGNOSIS — Z0000 Encounter for general adult medical examination without abnormal findings: Secondary | ICD-10-CM | POA: Diagnosis not present

## 2017-01-13 DIAGNOSIS — K219 Gastro-esophageal reflux disease without esophagitis: Secondary | ICD-10-CM | POA: Diagnosis not present

## 2017-01-13 DIAGNOSIS — E119 Type 2 diabetes mellitus without complications: Secondary | ICD-10-CM | POA: Diagnosis not present

## 2017-01-13 DIAGNOSIS — F0280 Dementia in other diseases classified elsewhere without behavioral disturbance: Secondary | ICD-10-CM | POA: Diagnosis not present

## 2017-01-13 DIAGNOSIS — G309 Alzheimer's disease, unspecified: Secondary | ICD-10-CM | POA: Diagnosis not present

## 2017-01-13 DIAGNOSIS — N183 Chronic kidney disease, stage 3 (moderate): Secondary | ICD-10-CM | POA: Diagnosis not present

## 2017-01-13 DIAGNOSIS — D649 Anemia, unspecified: Secondary | ICD-10-CM | POA: Diagnosis not present

## 2017-01-13 DIAGNOSIS — I10 Essential (primary) hypertension: Secondary | ICD-10-CM | POA: Diagnosis not present

## 2017-01-14 DIAGNOSIS — G309 Alzheimer's disease, unspecified: Secondary | ICD-10-CM | POA: Diagnosis not present

## 2017-01-15 DIAGNOSIS — G309 Alzheimer's disease, unspecified: Secondary | ICD-10-CM | POA: Diagnosis not present

## 2017-01-16 DIAGNOSIS — G309 Alzheimer's disease, unspecified: Secondary | ICD-10-CM | POA: Diagnosis not present

## 2017-01-17 DIAGNOSIS — G309 Alzheimer's disease, unspecified: Secondary | ICD-10-CM | POA: Diagnosis not present

## 2017-01-18 DIAGNOSIS — G309 Alzheimer's disease, unspecified: Secondary | ICD-10-CM | POA: Diagnosis not present

## 2017-01-19 DIAGNOSIS — G309 Alzheimer's disease, unspecified: Secondary | ICD-10-CM | POA: Diagnosis not present

## 2017-01-20 DIAGNOSIS — F419 Anxiety disorder, unspecified: Secondary | ICD-10-CM | POA: Diagnosis not present

## 2017-01-20 DIAGNOSIS — F0630 Mood disorder due to known physiological condition, unspecified: Secondary | ICD-10-CM | POA: Diagnosis not present

## 2017-01-20 DIAGNOSIS — R451 Restlessness and agitation: Secondary | ICD-10-CM | POA: Diagnosis not present

## 2017-01-20 DIAGNOSIS — G309 Alzheimer's disease, unspecified: Secondary | ICD-10-CM | POA: Diagnosis not present

## 2017-01-20 DIAGNOSIS — F0391 Unspecified dementia with behavioral disturbance: Secondary | ICD-10-CM | POA: Diagnosis not present

## 2017-01-21 DIAGNOSIS — G309 Alzheimer's disease, unspecified: Secondary | ICD-10-CM | POA: Diagnosis not present

## 2017-01-22 DIAGNOSIS — G309 Alzheimer's disease, unspecified: Secondary | ICD-10-CM | POA: Diagnosis not present

## 2017-01-23 DIAGNOSIS — G309 Alzheimer's disease, unspecified: Secondary | ICD-10-CM | POA: Diagnosis not present

## 2017-01-24 DIAGNOSIS — G309 Alzheimer's disease, unspecified: Secondary | ICD-10-CM | POA: Diagnosis not present

## 2017-01-25 DIAGNOSIS — G309 Alzheimer's disease, unspecified: Secondary | ICD-10-CM | POA: Diagnosis not present

## 2017-01-26 DIAGNOSIS — G309 Alzheimer's disease, unspecified: Secondary | ICD-10-CM | POA: Diagnosis not present

## 2017-01-27 DIAGNOSIS — G309 Alzheimer's disease, unspecified: Secondary | ICD-10-CM | POA: Diagnosis not present

## 2017-01-28 DIAGNOSIS — G309 Alzheimer's disease, unspecified: Secondary | ICD-10-CM | POA: Diagnosis not present

## 2017-01-29 DIAGNOSIS — G309 Alzheimer's disease, unspecified: Secondary | ICD-10-CM | POA: Diagnosis not present

## 2017-01-30 DIAGNOSIS — G309 Alzheimer's disease, unspecified: Secondary | ICD-10-CM | POA: Diagnosis not present

## 2017-01-31 DIAGNOSIS — G309 Alzheimer's disease, unspecified: Secondary | ICD-10-CM | POA: Diagnosis not present

## 2017-02-01 DIAGNOSIS — G309 Alzheimer's disease, unspecified: Secondary | ICD-10-CM | POA: Diagnosis not present

## 2017-02-02 DIAGNOSIS — G309 Alzheimer's disease, unspecified: Secondary | ICD-10-CM | POA: Diagnosis not present

## 2017-02-03 DIAGNOSIS — G309 Alzheimer's disease, unspecified: Secondary | ICD-10-CM | POA: Diagnosis not present

## 2017-02-04 DIAGNOSIS — G309 Alzheimer's disease, unspecified: Secondary | ICD-10-CM | POA: Diagnosis not present

## 2017-02-05 DIAGNOSIS — G309 Alzheimer's disease, unspecified: Secondary | ICD-10-CM | POA: Diagnosis not present

## 2017-02-06 DIAGNOSIS — G309 Alzheimer's disease, unspecified: Secondary | ICD-10-CM | POA: Diagnosis not present

## 2017-02-07 DIAGNOSIS — G309 Alzheimer's disease, unspecified: Secondary | ICD-10-CM | POA: Diagnosis not present

## 2017-02-08 DIAGNOSIS — G309 Alzheimer's disease, unspecified: Secondary | ICD-10-CM | POA: Diagnosis not present

## 2017-02-09 DIAGNOSIS — G309 Alzheimer's disease, unspecified: Secondary | ICD-10-CM | POA: Diagnosis not present

## 2017-02-09 DIAGNOSIS — R32 Unspecified urinary incontinence: Secondary | ICD-10-CM | POA: Diagnosis not present

## 2017-02-10 DIAGNOSIS — G309 Alzheimer's disease, unspecified: Secondary | ICD-10-CM | POA: Diagnosis not present

## 2017-02-11 DIAGNOSIS — G309 Alzheimer's disease, unspecified: Secondary | ICD-10-CM | POA: Diagnosis not present

## 2017-02-12 DIAGNOSIS — G309 Alzheimer's disease, unspecified: Secondary | ICD-10-CM | POA: Diagnosis not present

## 2017-02-13 DIAGNOSIS — G309 Alzheimer's disease, unspecified: Secondary | ICD-10-CM | POA: Diagnosis not present

## 2017-02-14 DIAGNOSIS — G309 Alzheimer's disease, unspecified: Secondary | ICD-10-CM | POA: Diagnosis not present

## 2017-02-15 DIAGNOSIS — G309 Alzheimer's disease, unspecified: Secondary | ICD-10-CM | POA: Diagnosis not present

## 2017-02-16 DIAGNOSIS — G309 Alzheimer's disease, unspecified: Secondary | ICD-10-CM | POA: Diagnosis not present

## 2017-02-17 DIAGNOSIS — G309 Alzheimer's disease, unspecified: Secondary | ICD-10-CM | POA: Diagnosis not present

## 2017-02-17 DIAGNOSIS — F0630 Mood disorder due to known physiological condition, unspecified: Secondary | ICD-10-CM | POA: Diagnosis not present

## 2017-02-17 DIAGNOSIS — R451 Restlessness and agitation: Secondary | ICD-10-CM | POA: Diagnosis not present

## 2017-02-17 DIAGNOSIS — F419 Anxiety disorder, unspecified: Secondary | ICD-10-CM | POA: Diagnosis not present

## 2017-02-17 DIAGNOSIS — F0391 Unspecified dementia with behavioral disturbance: Secondary | ICD-10-CM | POA: Diagnosis not present

## 2017-02-18 DIAGNOSIS — B351 Tinea unguium: Secondary | ICD-10-CM | POA: Diagnosis not present

## 2017-02-18 DIAGNOSIS — L84 Corns and callosities: Secondary | ICD-10-CM | POA: Diagnosis not present

## 2017-02-18 DIAGNOSIS — E119 Type 2 diabetes mellitus without complications: Secondary | ICD-10-CM | POA: Diagnosis not present

## 2017-02-18 DIAGNOSIS — G309 Alzheimer's disease, unspecified: Secondary | ICD-10-CM | POA: Diagnosis not present

## 2017-02-19 DIAGNOSIS — G309 Alzheimer's disease, unspecified: Secondary | ICD-10-CM | POA: Diagnosis not present

## 2017-02-20 DIAGNOSIS — G309 Alzheimer's disease, unspecified: Secondary | ICD-10-CM | POA: Diagnosis not present

## 2017-02-21 DIAGNOSIS — G309 Alzheimer's disease, unspecified: Secondary | ICD-10-CM | POA: Diagnosis not present

## 2017-02-22 DIAGNOSIS — G309 Alzheimer's disease, unspecified: Secondary | ICD-10-CM | POA: Diagnosis not present

## 2017-02-23 DIAGNOSIS — G309 Alzheimer's disease, unspecified: Secondary | ICD-10-CM | POA: Diagnosis not present

## 2017-02-24 DIAGNOSIS — G309 Alzheimer's disease, unspecified: Secondary | ICD-10-CM | POA: Diagnosis not present

## 2017-02-24 DIAGNOSIS — I10 Essential (primary) hypertension: Secondary | ICD-10-CM | POA: Diagnosis not present

## 2017-02-25 DIAGNOSIS — G309 Alzheimer's disease, unspecified: Secondary | ICD-10-CM | POA: Diagnosis not present

## 2017-02-26 DIAGNOSIS — G309 Alzheimer's disease, unspecified: Secondary | ICD-10-CM | POA: Diagnosis not present

## 2017-02-27 DIAGNOSIS — G309 Alzheimer's disease, unspecified: Secondary | ICD-10-CM | POA: Diagnosis not present

## 2017-02-28 DIAGNOSIS — G309 Alzheimer's disease, unspecified: Secondary | ICD-10-CM | POA: Diagnosis not present

## 2017-03-01 DIAGNOSIS — G309 Alzheimer's disease, unspecified: Secondary | ICD-10-CM | POA: Diagnosis not present

## 2017-03-02 DIAGNOSIS — G309 Alzheimer's disease, unspecified: Secondary | ICD-10-CM | POA: Diagnosis not present

## 2017-03-03 DIAGNOSIS — G309 Alzheimer's disease, unspecified: Secondary | ICD-10-CM | POA: Diagnosis not present

## 2017-03-04 DIAGNOSIS — G309 Alzheimer's disease, unspecified: Secondary | ICD-10-CM | POA: Diagnosis not present

## 2017-03-05 DIAGNOSIS — G309 Alzheimer's disease, unspecified: Secondary | ICD-10-CM | POA: Diagnosis not present

## 2017-03-06 DIAGNOSIS — G309 Alzheimer's disease, unspecified: Secondary | ICD-10-CM | POA: Diagnosis not present

## 2017-03-07 DIAGNOSIS — G309 Alzheimer's disease, unspecified: Secondary | ICD-10-CM | POA: Diagnosis not present

## 2017-03-08 DIAGNOSIS — G309 Alzheimer's disease, unspecified: Secondary | ICD-10-CM | POA: Diagnosis not present

## 2017-03-09 DIAGNOSIS — G309 Alzheimer's disease, unspecified: Secondary | ICD-10-CM | POA: Diagnosis not present

## 2017-03-10 DIAGNOSIS — D518 Other vitamin B12 deficiency anemias: Secondary | ICD-10-CM | POA: Diagnosis not present

## 2017-03-10 DIAGNOSIS — E038 Other specified hypothyroidism: Secondary | ICD-10-CM | POA: Diagnosis not present

## 2017-03-10 DIAGNOSIS — G309 Alzheimer's disease, unspecified: Secondary | ICD-10-CM | POA: Diagnosis not present

## 2017-03-10 DIAGNOSIS — E119 Type 2 diabetes mellitus without complications: Secondary | ICD-10-CM | POA: Diagnosis not present

## 2017-03-10 DIAGNOSIS — E7849 Other hyperlipidemia: Secondary | ICD-10-CM | POA: Diagnosis not present

## 2017-03-10 DIAGNOSIS — Z79899 Other long term (current) drug therapy: Secondary | ICD-10-CM | POA: Diagnosis not present

## 2017-03-10 DIAGNOSIS — E559 Vitamin D deficiency, unspecified: Secondary | ICD-10-CM | POA: Diagnosis not present

## 2017-03-11 DIAGNOSIS — G309 Alzheimer's disease, unspecified: Secondary | ICD-10-CM | POA: Diagnosis not present

## 2017-03-11 DIAGNOSIS — R32 Unspecified urinary incontinence: Secondary | ICD-10-CM | POA: Diagnosis not present

## 2017-03-12 DIAGNOSIS — G309 Alzheimer's disease, unspecified: Secondary | ICD-10-CM | POA: Diagnosis not present

## 2017-03-13 DIAGNOSIS — G309 Alzheimer's disease, unspecified: Secondary | ICD-10-CM | POA: Diagnosis not present

## 2017-03-14 DIAGNOSIS — G309 Alzheimer's disease, unspecified: Secondary | ICD-10-CM | POA: Diagnosis not present

## 2017-03-15 DIAGNOSIS — G309 Alzheimer's disease, unspecified: Secondary | ICD-10-CM | POA: Diagnosis not present

## 2017-03-16 DIAGNOSIS — G309 Alzheimer's disease, unspecified: Secondary | ICD-10-CM | POA: Diagnosis not present

## 2017-03-17 DIAGNOSIS — G309 Alzheimer's disease, unspecified: Secondary | ICD-10-CM | POA: Diagnosis not present

## 2017-03-18 DIAGNOSIS — G309 Alzheimer's disease, unspecified: Secondary | ICD-10-CM | POA: Diagnosis not present

## 2017-03-19 DIAGNOSIS — G309 Alzheimer's disease, unspecified: Secondary | ICD-10-CM | POA: Diagnosis not present

## 2017-03-20 DIAGNOSIS — G309 Alzheimer's disease, unspecified: Secondary | ICD-10-CM | POA: Diagnosis not present

## 2017-03-21 DIAGNOSIS — G309 Alzheimer's disease, unspecified: Secondary | ICD-10-CM | POA: Diagnosis not present

## 2017-03-22 DIAGNOSIS — G309 Alzheimer's disease, unspecified: Secondary | ICD-10-CM | POA: Diagnosis not present

## 2017-03-23 DIAGNOSIS — G309 Alzheimer's disease, unspecified: Secondary | ICD-10-CM | POA: Diagnosis not present

## 2017-03-24 DIAGNOSIS — F0281 Dementia in other diseases classified elsewhere with behavioral disturbance: Secondary | ICD-10-CM | POA: Diagnosis not present

## 2017-03-24 DIAGNOSIS — E785 Hyperlipidemia, unspecified: Secondary | ICD-10-CM | POA: Diagnosis not present

## 2017-03-24 DIAGNOSIS — N183 Chronic kidney disease, stage 3 (moderate): Secondary | ICD-10-CM | POA: Diagnosis not present

## 2017-03-24 DIAGNOSIS — G309 Alzheimer's disease, unspecified: Secondary | ICD-10-CM | POA: Diagnosis not present

## 2017-03-24 DIAGNOSIS — I10 Essential (primary) hypertension: Secondary | ICD-10-CM | POA: Diagnosis not present

## 2017-03-25 DIAGNOSIS — F419 Anxiety disorder, unspecified: Secondary | ICD-10-CM | POA: Diagnosis not present

## 2017-03-25 DIAGNOSIS — F0391 Unspecified dementia with behavioral disturbance: Secondary | ICD-10-CM | POA: Diagnosis not present

## 2017-03-25 DIAGNOSIS — G309 Alzheimer's disease, unspecified: Secondary | ICD-10-CM | POA: Diagnosis not present

## 2017-03-25 DIAGNOSIS — F0630 Mood disorder due to known physiological condition, unspecified: Secondary | ICD-10-CM | POA: Diagnosis not present

## 2017-03-25 DIAGNOSIS — R451 Restlessness and agitation: Secondary | ICD-10-CM | POA: Diagnosis not present

## 2017-03-26 DIAGNOSIS — G309 Alzheimer's disease, unspecified: Secondary | ICD-10-CM | POA: Diagnosis not present

## 2017-03-27 DIAGNOSIS — G309 Alzheimer's disease, unspecified: Secondary | ICD-10-CM | POA: Diagnosis not present

## 2017-03-28 DIAGNOSIS — G309 Alzheimer's disease, unspecified: Secondary | ICD-10-CM | POA: Diagnosis not present

## 2017-03-29 DIAGNOSIS — G309 Alzheimer's disease, unspecified: Secondary | ICD-10-CM | POA: Diagnosis not present

## 2017-03-30 DIAGNOSIS — G309 Alzheimer's disease, unspecified: Secondary | ICD-10-CM | POA: Diagnosis not present

## 2017-03-31 DIAGNOSIS — G309 Alzheimer's disease, unspecified: Secondary | ICD-10-CM | POA: Diagnosis not present

## 2017-04-01 DIAGNOSIS — G309 Alzheimer's disease, unspecified: Secondary | ICD-10-CM | POA: Diagnosis not present

## 2017-04-02 DIAGNOSIS — G309 Alzheimer's disease, unspecified: Secondary | ICD-10-CM | POA: Diagnosis not present

## 2017-04-03 DIAGNOSIS — G309 Alzheimer's disease, unspecified: Secondary | ICD-10-CM | POA: Diagnosis not present

## 2017-04-04 DIAGNOSIS — G309 Alzheimer's disease, unspecified: Secondary | ICD-10-CM | POA: Diagnosis not present

## 2017-04-05 DIAGNOSIS — G309 Alzheimer's disease, unspecified: Secondary | ICD-10-CM | POA: Diagnosis not present

## 2017-04-06 DIAGNOSIS — G309 Alzheimer's disease, unspecified: Secondary | ICD-10-CM | POA: Diagnosis not present

## 2017-04-07 DIAGNOSIS — G309 Alzheimer's disease, unspecified: Secondary | ICD-10-CM | POA: Diagnosis not present

## 2017-04-08 DIAGNOSIS — G309 Alzheimer's disease, unspecified: Secondary | ICD-10-CM | POA: Diagnosis not present

## 2017-04-09 DIAGNOSIS — G309 Alzheimer's disease, unspecified: Secondary | ICD-10-CM | POA: Diagnosis not present

## 2017-04-10 DIAGNOSIS — G309 Alzheimer's disease, unspecified: Secondary | ICD-10-CM | POA: Diagnosis not present

## 2017-04-11 DIAGNOSIS — G309 Alzheimer's disease, unspecified: Secondary | ICD-10-CM | POA: Diagnosis not present

## 2017-04-12 DIAGNOSIS — G309 Alzheimer's disease, unspecified: Secondary | ICD-10-CM | POA: Diagnosis not present

## 2017-04-13 DIAGNOSIS — G309 Alzheimer's disease, unspecified: Secondary | ICD-10-CM | POA: Diagnosis not present

## 2017-04-14 DIAGNOSIS — G309 Alzheimer's disease, unspecified: Secondary | ICD-10-CM | POA: Diagnosis not present

## 2017-04-15 DIAGNOSIS — G309 Alzheimer's disease, unspecified: Secondary | ICD-10-CM | POA: Diagnosis not present

## 2017-04-16 DIAGNOSIS — G309 Alzheimer's disease, unspecified: Secondary | ICD-10-CM | POA: Diagnosis not present

## 2017-04-17 DIAGNOSIS — G309 Alzheimer's disease, unspecified: Secondary | ICD-10-CM | POA: Diagnosis not present

## 2017-04-18 DIAGNOSIS — G309 Alzheimer's disease, unspecified: Secondary | ICD-10-CM | POA: Diagnosis not present

## 2017-04-19 DIAGNOSIS — G309 Alzheimer's disease, unspecified: Secondary | ICD-10-CM | POA: Diagnosis not present

## 2017-04-20 DIAGNOSIS — F0630 Mood disorder due to known physiological condition, unspecified: Secondary | ICD-10-CM | POA: Diagnosis not present

## 2017-04-20 DIAGNOSIS — G309 Alzheimer's disease, unspecified: Secondary | ICD-10-CM | POA: Diagnosis not present

## 2017-04-20 DIAGNOSIS — R451 Restlessness and agitation: Secondary | ICD-10-CM | POA: Diagnosis not present

## 2017-04-20 DIAGNOSIS — F0391 Unspecified dementia with behavioral disturbance: Secondary | ICD-10-CM | POA: Diagnosis not present

## 2017-04-20 DIAGNOSIS — F419 Anxiety disorder, unspecified: Secondary | ICD-10-CM | POA: Diagnosis not present

## 2017-04-21 DIAGNOSIS — E1122 Type 2 diabetes mellitus with diabetic chronic kidney disease: Secondary | ICD-10-CM | POA: Diagnosis not present

## 2017-04-21 DIAGNOSIS — N182 Chronic kidney disease, stage 2 (mild): Secondary | ICD-10-CM | POA: Diagnosis not present

## 2017-04-21 DIAGNOSIS — G309 Alzheimer's disease, unspecified: Secondary | ICD-10-CM | POA: Diagnosis not present

## 2017-04-21 DIAGNOSIS — I10 Essential (primary) hypertension: Secondary | ICD-10-CM | POA: Diagnosis not present

## 2017-04-22 DIAGNOSIS — G309 Alzheimer's disease, unspecified: Secondary | ICD-10-CM | POA: Diagnosis not present

## 2017-04-23 DIAGNOSIS — G309 Alzheimer's disease, unspecified: Secondary | ICD-10-CM | POA: Diagnosis not present

## 2017-04-24 DIAGNOSIS — G309 Alzheimer's disease, unspecified: Secondary | ICD-10-CM | POA: Diagnosis not present

## 2017-04-25 DIAGNOSIS — G309 Alzheimer's disease, unspecified: Secondary | ICD-10-CM | POA: Diagnosis not present

## 2017-04-26 DIAGNOSIS — G309 Alzheimer's disease, unspecified: Secondary | ICD-10-CM | POA: Diagnosis not present

## 2017-04-27 DIAGNOSIS — G309 Alzheimer's disease, unspecified: Secondary | ICD-10-CM | POA: Diagnosis not present

## 2017-04-28 DIAGNOSIS — G309 Alzheimer's disease, unspecified: Secondary | ICD-10-CM | POA: Diagnosis not present

## 2017-04-29 DIAGNOSIS — G309 Alzheimer's disease, unspecified: Secondary | ICD-10-CM | POA: Diagnosis not present

## 2017-04-30 DIAGNOSIS — G309 Alzheimer's disease, unspecified: Secondary | ICD-10-CM | POA: Diagnosis not present

## 2017-05-01 DIAGNOSIS — G309 Alzheimer's disease, unspecified: Secondary | ICD-10-CM | POA: Diagnosis not present

## 2017-05-02 DIAGNOSIS — R32 Unspecified urinary incontinence: Secondary | ICD-10-CM | POA: Diagnosis not present

## 2017-05-02 DIAGNOSIS — G309 Alzheimer's disease, unspecified: Secondary | ICD-10-CM | POA: Diagnosis not present

## 2017-05-03 DIAGNOSIS — G309 Alzheimer's disease, unspecified: Secondary | ICD-10-CM | POA: Diagnosis not present

## 2017-05-04 DIAGNOSIS — G309 Alzheimer's disease, unspecified: Secondary | ICD-10-CM | POA: Diagnosis not present

## 2017-05-05 DIAGNOSIS — G309 Alzheimer's disease, unspecified: Secondary | ICD-10-CM | POA: Diagnosis not present

## 2017-05-06 DIAGNOSIS — G309 Alzheimer's disease, unspecified: Secondary | ICD-10-CM | POA: Diagnosis not present

## 2017-05-07 DIAGNOSIS — G309 Alzheimer's disease, unspecified: Secondary | ICD-10-CM | POA: Diagnosis not present

## 2017-05-08 DIAGNOSIS — G309 Alzheimer's disease, unspecified: Secondary | ICD-10-CM | POA: Diagnosis not present

## 2017-05-09 DIAGNOSIS — G309 Alzheimer's disease, unspecified: Secondary | ICD-10-CM | POA: Diagnosis not present

## 2017-05-10 DIAGNOSIS — G309 Alzheimer's disease, unspecified: Secondary | ICD-10-CM | POA: Diagnosis not present

## 2017-05-11 DIAGNOSIS — G309 Alzheimer's disease, unspecified: Secondary | ICD-10-CM | POA: Diagnosis not present

## 2017-05-12 DIAGNOSIS — G309 Alzheimer's disease, unspecified: Secondary | ICD-10-CM | POA: Diagnosis not present

## 2017-05-13 DIAGNOSIS — G309 Alzheimer's disease, unspecified: Secondary | ICD-10-CM | POA: Diagnosis not present

## 2017-05-14 DIAGNOSIS — G309 Alzheimer's disease, unspecified: Secondary | ICD-10-CM | POA: Diagnosis not present

## 2017-05-15 DIAGNOSIS — G309 Alzheimer's disease, unspecified: Secondary | ICD-10-CM | POA: Diagnosis not present

## 2017-05-16 DIAGNOSIS — G309 Alzheimer's disease, unspecified: Secondary | ICD-10-CM | POA: Diagnosis not present

## 2017-05-17 DIAGNOSIS — G309 Alzheimer's disease, unspecified: Secondary | ICD-10-CM | POA: Diagnosis not present

## 2017-05-18 DIAGNOSIS — G309 Alzheimer's disease, unspecified: Secondary | ICD-10-CM | POA: Diagnosis not present

## 2017-05-19 DIAGNOSIS — G309 Alzheimer's disease, unspecified: Secondary | ICD-10-CM | POA: Diagnosis not present

## 2017-05-20 DIAGNOSIS — G309 Alzheimer's disease, unspecified: Secondary | ICD-10-CM | POA: Diagnosis not present

## 2017-05-21 DIAGNOSIS — G309 Alzheimer's disease, unspecified: Secondary | ICD-10-CM | POA: Diagnosis not present

## 2017-05-22 DIAGNOSIS — G309 Alzheimer's disease, unspecified: Secondary | ICD-10-CM | POA: Diagnosis not present

## 2017-05-23 DIAGNOSIS — G309 Alzheimer's disease, unspecified: Secondary | ICD-10-CM | POA: Diagnosis not present

## 2017-05-24 DIAGNOSIS — G309 Alzheimer's disease, unspecified: Secondary | ICD-10-CM | POA: Diagnosis not present

## 2017-05-25 DIAGNOSIS — G309 Alzheimer's disease, unspecified: Secondary | ICD-10-CM | POA: Diagnosis not present

## 2017-05-26 DIAGNOSIS — F419 Anxiety disorder, unspecified: Secondary | ICD-10-CM | POA: Diagnosis not present

## 2017-05-26 DIAGNOSIS — I10 Essential (primary) hypertension: Secondary | ICD-10-CM | POA: Diagnosis not present

## 2017-05-26 DIAGNOSIS — G309 Alzheimer's disease, unspecified: Secondary | ICD-10-CM | POA: Diagnosis not present

## 2017-05-26 DIAGNOSIS — F0630 Mood disorder due to known physiological condition, unspecified: Secondary | ICD-10-CM | POA: Diagnosis not present

## 2017-05-26 DIAGNOSIS — F0391 Unspecified dementia with behavioral disturbance: Secondary | ICD-10-CM | POA: Diagnosis not present

## 2017-05-26 DIAGNOSIS — N183 Chronic kidney disease, stage 3 (moderate): Secondary | ICD-10-CM | POA: Diagnosis not present

## 2017-05-26 DIAGNOSIS — F0281 Dementia in other diseases classified elsewhere with behavioral disturbance: Secondary | ICD-10-CM | POA: Diagnosis not present

## 2017-05-26 DIAGNOSIS — R451 Restlessness and agitation: Secondary | ICD-10-CM | POA: Diagnosis not present

## 2017-05-27 DIAGNOSIS — G309 Alzheimer's disease, unspecified: Secondary | ICD-10-CM | POA: Diagnosis not present

## 2017-05-28 DIAGNOSIS — G309 Alzheimer's disease, unspecified: Secondary | ICD-10-CM | POA: Diagnosis not present

## 2017-05-29 DIAGNOSIS — G309 Alzheimer's disease, unspecified: Secondary | ICD-10-CM | POA: Diagnosis not present

## 2017-05-30 DIAGNOSIS — G309 Alzheimer's disease, unspecified: Secondary | ICD-10-CM | POA: Diagnosis not present

## 2017-05-31 DIAGNOSIS — G309 Alzheimer's disease, unspecified: Secondary | ICD-10-CM | POA: Diagnosis not present

## 2017-05-31 DIAGNOSIS — R32 Unspecified urinary incontinence: Secondary | ICD-10-CM | POA: Diagnosis not present

## 2017-06-01 DIAGNOSIS — G309 Alzheimer's disease, unspecified: Secondary | ICD-10-CM | POA: Diagnosis not present

## 2017-06-02 DIAGNOSIS — G309 Alzheimer's disease, unspecified: Secondary | ICD-10-CM | POA: Diagnosis not present

## 2017-06-03 DIAGNOSIS — G309 Alzheimer's disease, unspecified: Secondary | ICD-10-CM | POA: Diagnosis not present

## 2017-06-04 DIAGNOSIS — G309 Alzheimer's disease, unspecified: Secondary | ICD-10-CM | POA: Diagnosis not present

## 2017-06-05 DIAGNOSIS — G309 Alzheimer's disease, unspecified: Secondary | ICD-10-CM | POA: Diagnosis not present

## 2017-06-06 DIAGNOSIS — G309 Alzheimer's disease, unspecified: Secondary | ICD-10-CM | POA: Diagnosis not present

## 2017-06-07 DIAGNOSIS — E7849 Other hyperlipidemia: Secondary | ICD-10-CM | POA: Diagnosis not present

## 2017-06-07 DIAGNOSIS — E038 Other specified hypothyroidism: Secondary | ICD-10-CM | POA: Diagnosis not present

## 2017-06-07 DIAGNOSIS — G309 Alzheimer's disease, unspecified: Secondary | ICD-10-CM | POA: Diagnosis not present

## 2017-06-07 DIAGNOSIS — E559 Vitamin D deficiency, unspecified: Secondary | ICD-10-CM | POA: Diagnosis not present

## 2017-06-07 DIAGNOSIS — E119 Type 2 diabetes mellitus without complications: Secondary | ICD-10-CM | POA: Diagnosis not present

## 2017-06-07 DIAGNOSIS — D518 Other vitamin B12 deficiency anemias: Secondary | ICD-10-CM | POA: Diagnosis not present

## 2017-06-07 DIAGNOSIS — Z79899 Other long term (current) drug therapy: Secondary | ICD-10-CM | POA: Diagnosis not present

## 2017-06-08 DIAGNOSIS — G309 Alzheimer's disease, unspecified: Secondary | ICD-10-CM | POA: Diagnosis not present

## 2017-06-09 DIAGNOSIS — G309 Alzheimer's disease, unspecified: Secondary | ICD-10-CM | POA: Diagnosis not present

## 2017-06-10 DIAGNOSIS — G309 Alzheimer's disease, unspecified: Secondary | ICD-10-CM | POA: Diagnosis not present

## 2017-06-11 DIAGNOSIS — G309 Alzheimer's disease, unspecified: Secondary | ICD-10-CM | POA: Diagnosis not present

## 2017-06-12 DIAGNOSIS — G309 Alzheimer's disease, unspecified: Secondary | ICD-10-CM | POA: Diagnosis not present

## 2017-06-13 DIAGNOSIS — G309 Alzheimer's disease, unspecified: Secondary | ICD-10-CM | POA: Diagnosis not present

## 2017-06-14 DIAGNOSIS — G309 Alzheimer's disease, unspecified: Secondary | ICD-10-CM | POA: Diagnosis not present

## 2017-06-15 DIAGNOSIS — G309 Alzheimer's disease, unspecified: Secondary | ICD-10-CM | POA: Diagnosis not present

## 2017-06-16 DIAGNOSIS — G309 Alzheimer's disease, unspecified: Secondary | ICD-10-CM | POA: Diagnosis not present

## 2017-06-17 DIAGNOSIS — E119 Type 2 diabetes mellitus without complications: Secondary | ICD-10-CM | POA: Diagnosis not present

## 2017-06-17 DIAGNOSIS — B351 Tinea unguium: Secondary | ICD-10-CM | POA: Diagnosis not present

## 2017-06-17 DIAGNOSIS — G309 Alzheimer's disease, unspecified: Secondary | ICD-10-CM | POA: Diagnosis not present

## 2017-06-17 DIAGNOSIS — L84 Corns and callosities: Secondary | ICD-10-CM | POA: Diagnosis not present

## 2017-06-18 DIAGNOSIS — G309 Alzheimer's disease, unspecified: Secondary | ICD-10-CM | POA: Diagnosis not present

## 2017-06-19 DIAGNOSIS — G309 Alzheimer's disease, unspecified: Secondary | ICD-10-CM | POA: Diagnosis not present

## 2017-06-20 DIAGNOSIS — G309 Alzheimer's disease, unspecified: Secondary | ICD-10-CM | POA: Diagnosis not present

## 2017-06-21 DIAGNOSIS — G309 Alzheimer's disease, unspecified: Secondary | ICD-10-CM | POA: Diagnosis not present

## 2017-06-21 DIAGNOSIS — R011 Cardiac murmur, unspecified: Secondary | ICD-10-CM | POA: Diagnosis not present

## 2017-06-22 DIAGNOSIS — G309 Alzheimer's disease, unspecified: Secondary | ICD-10-CM | POA: Diagnosis not present

## 2017-06-23 DIAGNOSIS — F419 Anxiety disorder, unspecified: Secondary | ICD-10-CM | POA: Diagnosis not present

## 2017-06-23 DIAGNOSIS — G309 Alzheimer's disease, unspecified: Secondary | ICD-10-CM | POA: Diagnosis not present

## 2017-06-23 DIAGNOSIS — I10 Essential (primary) hypertension: Secondary | ICD-10-CM | POA: Diagnosis not present

## 2017-06-23 DIAGNOSIS — F0391 Unspecified dementia with behavioral disturbance: Secondary | ICD-10-CM | POA: Diagnosis not present

## 2017-06-23 DIAGNOSIS — R451 Restlessness and agitation: Secondary | ICD-10-CM | POA: Diagnosis not present

## 2017-06-23 DIAGNOSIS — F0281 Dementia in other diseases classified elsewhere with behavioral disturbance: Secondary | ICD-10-CM | POA: Diagnosis not present

## 2017-06-23 DIAGNOSIS — N182 Chronic kidney disease, stage 2 (mild): Secondary | ICD-10-CM | POA: Diagnosis not present

## 2017-06-23 DIAGNOSIS — F0630 Mood disorder due to known physiological condition, unspecified: Secondary | ICD-10-CM | POA: Diagnosis not present

## 2017-06-23 DIAGNOSIS — E1122 Type 2 diabetes mellitus with diabetic chronic kidney disease: Secondary | ICD-10-CM | POA: Diagnosis not present

## 2017-06-24 DIAGNOSIS — G309 Alzheimer's disease, unspecified: Secondary | ICD-10-CM | POA: Diagnosis not present

## 2017-06-25 DIAGNOSIS — G309 Alzheimer's disease, unspecified: Secondary | ICD-10-CM | POA: Diagnosis not present

## 2017-06-26 DIAGNOSIS — G309 Alzheimer's disease, unspecified: Secondary | ICD-10-CM | POA: Diagnosis not present

## 2017-06-27 DIAGNOSIS — G309 Alzheimer's disease, unspecified: Secondary | ICD-10-CM | POA: Diagnosis not present

## 2017-06-28 DIAGNOSIS — G309 Alzheimer's disease, unspecified: Secondary | ICD-10-CM | POA: Diagnosis not present

## 2017-06-29 DIAGNOSIS — G309 Alzheimer's disease, unspecified: Secondary | ICD-10-CM | POA: Diagnosis not present

## 2017-06-30 DIAGNOSIS — G309 Alzheimer's disease, unspecified: Secondary | ICD-10-CM | POA: Diagnosis not present

## 2017-07-01 DIAGNOSIS — G309 Alzheimer's disease, unspecified: Secondary | ICD-10-CM | POA: Diagnosis not present

## 2017-07-02 DIAGNOSIS — G309 Alzheimer's disease, unspecified: Secondary | ICD-10-CM | POA: Diagnosis not present

## 2017-07-03 DIAGNOSIS — G309 Alzheimer's disease, unspecified: Secondary | ICD-10-CM | POA: Diagnosis not present

## 2017-07-04 DIAGNOSIS — G309 Alzheimer's disease, unspecified: Secondary | ICD-10-CM | POA: Diagnosis not present

## 2017-07-05 DIAGNOSIS — G309 Alzheimer's disease, unspecified: Secondary | ICD-10-CM | POA: Diagnosis not present

## 2017-07-06 DIAGNOSIS — R32 Unspecified urinary incontinence: Secondary | ICD-10-CM | POA: Diagnosis not present

## 2017-07-06 DIAGNOSIS — G309 Alzheimer's disease, unspecified: Secondary | ICD-10-CM | POA: Diagnosis not present

## 2017-07-07 DIAGNOSIS — G309 Alzheimer's disease, unspecified: Secondary | ICD-10-CM | POA: Diagnosis not present

## 2017-07-08 DIAGNOSIS — G309 Alzheimer's disease, unspecified: Secondary | ICD-10-CM | POA: Diagnosis not present

## 2017-07-09 DIAGNOSIS — G309 Alzheimer's disease, unspecified: Secondary | ICD-10-CM | POA: Diagnosis not present

## 2017-07-10 DIAGNOSIS — G309 Alzheimer's disease, unspecified: Secondary | ICD-10-CM | POA: Diagnosis not present

## 2017-07-11 DIAGNOSIS — G309 Alzheimer's disease, unspecified: Secondary | ICD-10-CM | POA: Diagnosis not present

## 2017-07-12 DIAGNOSIS — G309 Alzheimer's disease, unspecified: Secondary | ICD-10-CM | POA: Diagnosis not present

## 2017-07-13 DIAGNOSIS — G309 Alzheimer's disease, unspecified: Secondary | ICD-10-CM | POA: Diagnosis not present

## 2017-07-14 DIAGNOSIS — G309 Alzheimer's disease, unspecified: Secondary | ICD-10-CM | POA: Diagnosis not present

## 2017-07-15 DIAGNOSIS — G309 Alzheimer's disease, unspecified: Secondary | ICD-10-CM | POA: Diagnosis not present

## 2017-07-16 DIAGNOSIS — G309 Alzheimer's disease, unspecified: Secondary | ICD-10-CM | POA: Diagnosis not present

## 2017-07-17 DIAGNOSIS — G309 Alzheimer's disease, unspecified: Secondary | ICD-10-CM | POA: Diagnosis not present

## 2017-07-18 DIAGNOSIS — G309 Alzheimer's disease, unspecified: Secondary | ICD-10-CM | POA: Diagnosis not present

## 2017-07-19 DIAGNOSIS — G309 Alzheimer's disease, unspecified: Secondary | ICD-10-CM | POA: Diagnosis not present

## 2017-07-20 DIAGNOSIS — G309 Alzheimer's disease, unspecified: Secondary | ICD-10-CM | POA: Diagnosis not present

## 2017-07-21 DIAGNOSIS — I10 Essential (primary) hypertension: Secondary | ICD-10-CM | POA: Diagnosis not present

## 2017-07-21 DIAGNOSIS — E1122 Type 2 diabetes mellitus with diabetic chronic kidney disease: Secondary | ICD-10-CM | POA: Diagnosis not present

## 2017-07-21 DIAGNOSIS — E785 Hyperlipidemia, unspecified: Secondary | ICD-10-CM | POA: Diagnosis not present

## 2017-07-21 DIAGNOSIS — F0281 Dementia in other diseases classified elsewhere with behavioral disturbance: Secondary | ICD-10-CM | POA: Diagnosis not present

## 2017-07-21 DIAGNOSIS — G309 Alzheimer's disease, unspecified: Secondary | ICD-10-CM | POA: Diagnosis not present

## 2017-07-22 DIAGNOSIS — E038 Other specified hypothyroidism: Secondary | ICD-10-CM | POA: Diagnosis not present

## 2017-07-22 DIAGNOSIS — G309 Alzheimer's disease, unspecified: Secondary | ICD-10-CM | POA: Diagnosis not present

## 2017-07-22 DIAGNOSIS — E1165 Type 2 diabetes mellitus with hyperglycemia: Secondary | ICD-10-CM | POA: Diagnosis not present

## 2017-07-22 DIAGNOSIS — I10 Essential (primary) hypertension: Secondary | ICD-10-CM | POA: Diagnosis not present

## 2017-07-22 DIAGNOSIS — E119 Type 2 diabetes mellitus without complications: Secondary | ICD-10-CM | POA: Diagnosis not present

## 2017-07-23 DIAGNOSIS — G309 Alzheimer's disease, unspecified: Secondary | ICD-10-CM | POA: Diagnosis not present

## 2017-07-24 DIAGNOSIS — G309 Alzheimer's disease, unspecified: Secondary | ICD-10-CM | POA: Diagnosis not present

## 2017-07-25 DIAGNOSIS — G309 Alzheimer's disease, unspecified: Secondary | ICD-10-CM | POA: Diagnosis not present

## 2017-07-26 DIAGNOSIS — G309 Alzheimer's disease, unspecified: Secondary | ICD-10-CM | POA: Diagnosis not present

## 2017-07-27 DIAGNOSIS — G309 Alzheimer's disease, unspecified: Secondary | ICD-10-CM | POA: Diagnosis not present

## 2017-07-28 DIAGNOSIS — G309 Alzheimer's disease, unspecified: Secondary | ICD-10-CM | POA: Diagnosis not present

## 2017-07-29 DIAGNOSIS — G309 Alzheimer's disease, unspecified: Secondary | ICD-10-CM | POA: Diagnosis not present

## 2017-07-30 DIAGNOSIS — G309 Alzheimer's disease, unspecified: Secondary | ICD-10-CM | POA: Diagnosis not present

## 2017-07-31 DIAGNOSIS — G309 Alzheimer's disease, unspecified: Secondary | ICD-10-CM | POA: Diagnosis not present

## 2017-08-01 DIAGNOSIS — G309 Alzheimer's disease, unspecified: Secondary | ICD-10-CM | POA: Diagnosis not present

## 2017-08-02 DIAGNOSIS — R159 Full incontinence of feces: Secondary | ICD-10-CM | POA: Diagnosis not present

## 2017-08-02 DIAGNOSIS — R32 Unspecified urinary incontinence: Secondary | ICD-10-CM | POA: Diagnosis not present

## 2017-08-02 DIAGNOSIS — G309 Alzheimer's disease, unspecified: Secondary | ICD-10-CM | POA: Diagnosis not present

## 2017-08-03 DIAGNOSIS — G309 Alzheimer's disease, unspecified: Secondary | ICD-10-CM | POA: Diagnosis not present

## 2017-08-04 DIAGNOSIS — G309 Alzheimer's disease, unspecified: Secondary | ICD-10-CM | POA: Diagnosis not present

## 2017-08-05 DIAGNOSIS — G309 Alzheimer's disease, unspecified: Secondary | ICD-10-CM | POA: Diagnosis not present

## 2017-08-05 DIAGNOSIS — F419 Anxiety disorder, unspecified: Secondary | ICD-10-CM | POA: Diagnosis not present

## 2017-08-05 DIAGNOSIS — R451 Restlessness and agitation: Secondary | ICD-10-CM | POA: Diagnosis not present

## 2017-08-05 DIAGNOSIS — F0630 Mood disorder due to known physiological condition, unspecified: Secondary | ICD-10-CM | POA: Diagnosis not present

## 2017-08-05 DIAGNOSIS — F0391 Unspecified dementia with behavioral disturbance: Secondary | ICD-10-CM | POA: Diagnosis not present

## 2017-08-06 DIAGNOSIS — G309 Alzheimer's disease, unspecified: Secondary | ICD-10-CM | POA: Diagnosis not present

## 2017-08-07 DIAGNOSIS — G309 Alzheimer's disease, unspecified: Secondary | ICD-10-CM | POA: Diagnosis not present

## 2017-08-08 DIAGNOSIS — G309 Alzheimer's disease, unspecified: Secondary | ICD-10-CM | POA: Diagnosis not present

## 2017-08-09 DIAGNOSIS — G309 Alzheimer's disease, unspecified: Secondary | ICD-10-CM | POA: Diagnosis not present

## 2017-08-10 DIAGNOSIS — G309 Alzheimer's disease, unspecified: Secondary | ICD-10-CM | POA: Diagnosis not present

## 2017-08-11 DIAGNOSIS — G309 Alzheimer's disease, unspecified: Secondary | ICD-10-CM | POA: Diagnosis not present

## 2017-08-12 DIAGNOSIS — G309 Alzheimer's disease, unspecified: Secondary | ICD-10-CM | POA: Diagnosis not present

## 2017-08-13 DIAGNOSIS — G309 Alzheimer's disease, unspecified: Secondary | ICD-10-CM | POA: Diagnosis not present

## 2017-08-14 DIAGNOSIS — G309 Alzheimer's disease, unspecified: Secondary | ICD-10-CM | POA: Diagnosis not present

## 2017-08-15 DIAGNOSIS — G309 Alzheimer's disease, unspecified: Secondary | ICD-10-CM | POA: Diagnosis not present

## 2017-08-16 DIAGNOSIS — G309 Alzheimer's disease, unspecified: Secondary | ICD-10-CM | POA: Diagnosis not present

## 2017-08-17 DIAGNOSIS — G309 Alzheimer's disease, unspecified: Secondary | ICD-10-CM | POA: Diagnosis not present

## 2017-08-18 DIAGNOSIS — G309 Alzheimer's disease, unspecified: Secondary | ICD-10-CM | POA: Diagnosis not present

## 2017-08-18 DIAGNOSIS — F329 Major depressive disorder, single episode, unspecified: Secondary | ICD-10-CM | POA: Diagnosis not present

## 2017-08-18 DIAGNOSIS — I10 Essential (primary) hypertension: Secondary | ICD-10-CM | POA: Diagnosis not present

## 2017-08-18 DIAGNOSIS — N182 Chronic kidney disease, stage 2 (mild): Secondary | ICD-10-CM | POA: Diagnosis not present

## 2017-08-18 DIAGNOSIS — F0281 Dementia in other diseases classified elsewhere with behavioral disturbance: Secondary | ICD-10-CM | POA: Diagnosis not present

## 2017-08-19 DIAGNOSIS — G309 Alzheimer's disease, unspecified: Secondary | ICD-10-CM | POA: Diagnosis not present

## 2017-08-20 DIAGNOSIS — G309 Alzheimer's disease, unspecified: Secondary | ICD-10-CM | POA: Diagnosis not present

## 2017-08-21 DIAGNOSIS — G309 Alzheimer's disease, unspecified: Secondary | ICD-10-CM | POA: Diagnosis not present

## 2017-08-22 DIAGNOSIS — G309 Alzheimer's disease, unspecified: Secondary | ICD-10-CM | POA: Diagnosis not present

## 2017-08-23 DIAGNOSIS — G309 Alzheimer's disease, unspecified: Secondary | ICD-10-CM | POA: Diagnosis not present

## 2017-08-24 DIAGNOSIS — G309 Alzheimer's disease, unspecified: Secondary | ICD-10-CM | POA: Diagnosis not present

## 2017-08-25 DIAGNOSIS — F419 Anxiety disorder, unspecified: Secondary | ICD-10-CM | POA: Diagnosis not present

## 2017-08-25 DIAGNOSIS — G309 Alzheimer's disease, unspecified: Secondary | ICD-10-CM | POA: Diagnosis not present

## 2017-08-25 DIAGNOSIS — F0630 Mood disorder due to known physiological condition, unspecified: Secondary | ICD-10-CM | POA: Diagnosis not present

## 2017-08-25 DIAGNOSIS — F0391 Unspecified dementia with behavioral disturbance: Secondary | ICD-10-CM | POA: Diagnosis not present

## 2017-08-25 DIAGNOSIS — R451 Restlessness and agitation: Secondary | ICD-10-CM | POA: Diagnosis not present

## 2017-08-26 DIAGNOSIS — G309 Alzheimer's disease, unspecified: Secondary | ICD-10-CM | POA: Diagnosis not present

## 2017-08-27 DIAGNOSIS — G309 Alzheimer's disease, unspecified: Secondary | ICD-10-CM | POA: Diagnosis not present

## 2017-08-28 DIAGNOSIS — G309 Alzheimer's disease, unspecified: Secondary | ICD-10-CM | POA: Diagnosis not present

## 2017-08-29 DIAGNOSIS — G309 Alzheimer's disease, unspecified: Secondary | ICD-10-CM | POA: Diagnosis not present

## 2017-08-30 DIAGNOSIS — G309 Alzheimer's disease, unspecified: Secondary | ICD-10-CM | POA: Diagnosis not present

## 2017-08-31 DIAGNOSIS — R159 Full incontinence of feces: Secondary | ICD-10-CM | POA: Diagnosis not present

## 2017-08-31 DIAGNOSIS — D518 Other vitamin B12 deficiency anemias: Secondary | ICD-10-CM | POA: Diagnosis not present

## 2017-08-31 DIAGNOSIS — E7849 Other hyperlipidemia: Secondary | ICD-10-CM | POA: Diagnosis not present

## 2017-08-31 DIAGNOSIS — G309 Alzheimer's disease, unspecified: Secondary | ICD-10-CM | POA: Diagnosis not present

## 2017-08-31 DIAGNOSIS — E559 Vitamin D deficiency, unspecified: Secondary | ICD-10-CM | POA: Diagnosis not present

## 2017-08-31 DIAGNOSIS — R32 Unspecified urinary incontinence: Secondary | ICD-10-CM | POA: Diagnosis not present

## 2017-08-31 DIAGNOSIS — Z79899 Other long term (current) drug therapy: Secondary | ICD-10-CM | POA: Diagnosis not present

## 2017-08-31 DIAGNOSIS — E038 Other specified hypothyroidism: Secondary | ICD-10-CM | POA: Diagnosis not present

## 2017-08-31 DIAGNOSIS — E119 Type 2 diabetes mellitus without complications: Secondary | ICD-10-CM | POA: Diagnosis not present

## 2017-09-01 DIAGNOSIS — G309 Alzheimer's disease, unspecified: Secondary | ICD-10-CM | POA: Diagnosis not present

## 2017-09-02 DIAGNOSIS — G309 Alzheimer's disease, unspecified: Secondary | ICD-10-CM | POA: Diagnosis not present

## 2017-09-03 DIAGNOSIS — G309 Alzheimer's disease, unspecified: Secondary | ICD-10-CM | POA: Diagnosis not present

## 2017-09-04 DIAGNOSIS — G309 Alzheimer's disease, unspecified: Secondary | ICD-10-CM | POA: Diagnosis not present

## 2017-09-05 DIAGNOSIS — G309 Alzheimer's disease, unspecified: Secondary | ICD-10-CM | POA: Diagnosis not present

## 2017-09-06 DIAGNOSIS — G309 Alzheimer's disease, unspecified: Secondary | ICD-10-CM | POA: Diagnosis not present

## 2017-09-07 DIAGNOSIS — G309 Alzheimer's disease, unspecified: Secondary | ICD-10-CM | POA: Diagnosis not present

## 2017-09-08 DIAGNOSIS — G309 Alzheimer's disease, unspecified: Secondary | ICD-10-CM | POA: Diagnosis not present

## 2017-09-09 DIAGNOSIS — G309 Alzheimer's disease, unspecified: Secondary | ICD-10-CM | POA: Diagnosis not present

## 2017-09-10 DIAGNOSIS — G309 Alzheimer's disease, unspecified: Secondary | ICD-10-CM | POA: Diagnosis not present

## 2017-09-11 DIAGNOSIS — G309 Alzheimer's disease, unspecified: Secondary | ICD-10-CM | POA: Diagnosis not present

## 2017-09-12 DIAGNOSIS — G309 Alzheimer's disease, unspecified: Secondary | ICD-10-CM | POA: Diagnosis not present

## 2017-09-13 DIAGNOSIS — G309 Alzheimer's disease, unspecified: Secondary | ICD-10-CM | POA: Diagnosis not present

## 2017-09-14 DIAGNOSIS — G309 Alzheimer's disease, unspecified: Secondary | ICD-10-CM | POA: Diagnosis not present

## 2017-09-15 DIAGNOSIS — G309 Alzheimer's disease, unspecified: Secondary | ICD-10-CM | POA: Diagnosis not present

## 2017-09-15 DIAGNOSIS — N182 Chronic kidney disease, stage 2 (mild): Secondary | ICD-10-CM | POA: Diagnosis not present

## 2017-09-15 DIAGNOSIS — I10 Essential (primary) hypertension: Secondary | ICD-10-CM | POA: Diagnosis not present

## 2017-09-15 DIAGNOSIS — F0281 Dementia in other diseases classified elsewhere with behavioral disturbance: Secondary | ICD-10-CM | POA: Diagnosis not present

## 2017-09-15 DIAGNOSIS — E785 Hyperlipidemia, unspecified: Secondary | ICD-10-CM | POA: Diagnosis not present

## 2017-09-16 DIAGNOSIS — G309 Alzheimer's disease, unspecified: Secondary | ICD-10-CM | POA: Diagnosis not present

## 2017-09-17 DIAGNOSIS — G309 Alzheimer's disease, unspecified: Secondary | ICD-10-CM | POA: Diagnosis not present

## 2017-09-18 DIAGNOSIS — G309 Alzheimer's disease, unspecified: Secondary | ICD-10-CM | POA: Diagnosis not present

## 2017-09-19 DIAGNOSIS — G309 Alzheimer's disease, unspecified: Secondary | ICD-10-CM | POA: Diagnosis not present

## 2017-09-20 DIAGNOSIS — G309 Alzheimer's disease, unspecified: Secondary | ICD-10-CM | POA: Diagnosis not present

## 2017-09-21 DIAGNOSIS — G309 Alzheimer's disease, unspecified: Secondary | ICD-10-CM | POA: Diagnosis not present

## 2017-09-22 DIAGNOSIS — F0391 Unspecified dementia with behavioral disturbance: Secondary | ICD-10-CM | POA: Diagnosis not present

## 2017-09-22 DIAGNOSIS — G309 Alzheimer's disease, unspecified: Secondary | ICD-10-CM | POA: Diagnosis not present

## 2017-09-22 DIAGNOSIS — F419 Anxiety disorder, unspecified: Secondary | ICD-10-CM | POA: Diagnosis not present

## 2017-09-22 DIAGNOSIS — R451 Restlessness and agitation: Secondary | ICD-10-CM | POA: Diagnosis not present

## 2017-09-22 DIAGNOSIS — F0630 Mood disorder due to known physiological condition, unspecified: Secondary | ICD-10-CM | POA: Diagnosis not present

## 2017-09-23 DIAGNOSIS — G309 Alzheimer's disease, unspecified: Secondary | ICD-10-CM | POA: Diagnosis not present

## 2017-09-24 DIAGNOSIS — G309 Alzheimer's disease, unspecified: Secondary | ICD-10-CM | POA: Diagnosis not present

## 2017-09-25 DIAGNOSIS — G309 Alzheimer's disease, unspecified: Secondary | ICD-10-CM | POA: Diagnosis not present

## 2017-09-26 DIAGNOSIS — G309 Alzheimer's disease, unspecified: Secondary | ICD-10-CM | POA: Diagnosis not present

## 2017-09-27 DIAGNOSIS — G309 Alzheimer's disease, unspecified: Secondary | ICD-10-CM | POA: Diagnosis not present

## 2017-09-28 DIAGNOSIS — G309 Alzheimer's disease, unspecified: Secondary | ICD-10-CM | POA: Diagnosis not present

## 2017-09-29 DIAGNOSIS — G309 Alzheimer's disease, unspecified: Secondary | ICD-10-CM | POA: Diagnosis not present

## 2017-09-30 DIAGNOSIS — G309 Alzheimer's disease, unspecified: Secondary | ICD-10-CM | POA: Diagnosis not present

## 2017-10-01 DIAGNOSIS — G309 Alzheimer's disease, unspecified: Secondary | ICD-10-CM | POA: Diagnosis not present

## 2017-10-02 DIAGNOSIS — G309 Alzheimer's disease, unspecified: Secondary | ICD-10-CM | POA: Diagnosis not present

## 2017-10-03 DIAGNOSIS — G309 Alzheimer's disease, unspecified: Secondary | ICD-10-CM | POA: Diagnosis not present

## 2017-10-04 DIAGNOSIS — G309 Alzheimer's disease, unspecified: Secondary | ICD-10-CM | POA: Diagnosis not present

## 2017-10-04 DIAGNOSIS — R159 Full incontinence of feces: Secondary | ICD-10-CM | POA: Diagnosis not present

## 2017-10-04 DIAGNOSIS — R32 Unspecified urinary incontinence: Secondary | ICD-10-CM | POA: Diagnosis not present

## 2017-10-05 DIAGNOSIS — G309 Alzheimer's disease, unspecified: Secondary | ICD-10-CM | POA: Diagnosis not present

## 2017-10-06 DIAGNOSIS — G309 Alzheimer's disease, unspecified: Secondary | ICD-10-CM | POA: Diagnosis not present

## 2017-10-07 DIAGNOSIS — G309 Alzheimer's disease, unspecified: Secondary | ICD-10-CM | POA: Diagnosis not present

## 2017-10-08 DIAGNOSIS — G309 Alzheimer's disease, unspecified: Secondary | ICD-10-CM | POA: Diagnosis not present

## 2017-10-09 DIAGNOSIS — G309 Alzheimer's disease, unspecified: Secondary | ICD-10-CM | POA: Diagnosis not present

## 2017-10-10 DIAGNOSIS — N182 Chronic kidney disease, stage 2 (mild): Secondary | ICD-10-CM | POA: Diagnosis not present

## 2017-10-10 DIAGNOSIS — F0281 Dementia in other diseases classified elsewhere with behavioral disturbance: Secondary | ICD-10-CM | POA: Diagnosis not present

## 2017-10-10 DIAGNOSIS — G309 Alzheimer's disease, unspecified: Secondary | ICD-10-CM | POA: Diagnosis not present

## 2017-10-10 DIAGNOSIS — I10 Essential (primary) hypertension: Secondary | ICD-10-CM | POA: Diagnosis not present

## 2017-10-11 DIAGNOSIS — G309 Alzheimer's disease, unspecified: Secondary | ICD-10-CM | POA: Diagnosis not present

## 2017-10-12 DIAGNOSIS — G309 Alzheimer's disease, unspecified: Secondary | ICD-10-CM | POA: Diagnosis not present

## 2017-10-13 DIAGNOSIS — G309 Alzheimer's disease, unspecified: Secondary | ICD-10-CM | POA: Diagnosis not present

## 2017-10-14 DIAGNOSIS — G309 Alzheimer's disease, unspecified: Secondary | ICD-10-CM | POA: Diagnosis not present

## 2017-10-15 DIAGNOSIS — G309 Alzheimer's disease, unspecified: Secondary | ICD-10-CM | POA: Diagnosis not present

## 2017-10-16 DIAGNOSIS — G309 Alzheimer's disease, unspecified: Secondary | ICD-10-CM | POA: Diagnosis not present

## 2017-10-17 DIAGNOSIS — G309 Alzheimer's disease, unspecified: Secondary | ICD-10-CM | POA: Diagnosis not present

## 2017-10-18 DIAGNOSIS — E1165 Type 2 diabetes mellitus with hyperglycemia: Secondary | ICD-10-CM | POA: Diagnosis not present

## 2017-10-18 DIAGNOSIS — E119 Type 2 diabetes mellitus without complications: Secondary | ICD-10-CM | POA: Diagnosis not present

## 2017-10-18 DIAGNOSIS — E038 Other specified hypothyroidism: Secondary | ICD-10-CM | POA: Diagnosis not present

## 2017-10-18 DIAGNOSIS — G309 Alzheimer's disease, unspecified: Secondary | ICD-10-CM | POA: Diagnosis not present

## 2017-10-18 DIAGNOSIS — I10 Essential (primary) hypertension: Secondary | ICD-10-CM | POA: Diagnosis not present

## 2017-10-19 DIAGNOSIS — G309 Alzheimer's disease, unspecified: Secondary | ICD-10-CM | POA: Diagnosis not present

## 2017-10-20 DIAGNOSIS — G309 Alzheimer's disease, unspecified: Secondary | ICD-10-CM | POA: Diagnosis not present

## 2017-10-21 DIAGNOSIS — G309 Alzheimer's disease, unspecified: Secondary | ICD-10-CM | POA: Diagnosis not present

## 2017-10-22 DIAGNOSIS — G309 Alzheimer's disease, unspecified: Secondary | ICD-10-CM | POA: Diagnosis not present

## 2017-10-23 DIAGNOSIS — G309 Alzheimer's disease, unspecified: Secondary | ICD-10-CM | POA: Diagnosis not present

## 2017-10-25 DIAGNOSIS — F419 Anxiety disorder, unspecified: Secondary | ICD-10-CM | POA: Diagnosis not present

## 2017-10-25 DIAGNOSIS — F0391 Unspecified dementia with behavioral disturbance: Secondary | ICD-10-CM | POA: Diagnosis not present

## 2017-10-25 DIAGNOSIS — R451 Restlessness and agitation: Secondary | ICD-10-CM | POA: Diagnosis not present

## 2017-10-25 DIAGNOSIS — B351 Tinea unguium: Secondary | ICD-10-CM | POA: Diagnosis not present

## 2017-10-25 DIAGNOSIS — L84 Corns and callosities: Secondary | ICD-10-CM | POA: Diagnosis not present

## 2017-10-25 DIAGNOSIS — F0630 Mood disorder due to known physiological condition, unspecified: Secondary | ICD-10-CM | POA: Diagnosis not present

## 2017-10-31 DIAGNOSIS — G309 Alzheimer's disease, unspecified: Secondary | ICD-10-CM | POA: Diagnosis not present

## 2017-11-01 DIAGNOSIS — G309 Alzheimer's disease, unspecified: Secondary | ICD-10-CM | POA: Diagnosis not present

## 2017-11-02 DIAGNOSIS — G309 Alzheimer's disease, unspecified: Secondary | ICD-10-CM | POA: Diagnosis not present

## 2017-11-03 DIAGNOSIS — G309 Alzheimer's disease, unspecified: Secondary | ICD-10-CM | POA: Diagnosis not present

## 2017-11-04 DIAGNOSIS — R32 Unspecified urinary incontinence: Secondary | ICD-10-CM | POA: Diagnosis not present

## 2017-11-04 DIAGNOSIS — R159 Full incontinence of feces: Secondary | ICD-10-CM | POA: Diagnosis not present

## 2017-11-04 DIAGNOSIS — G309 Alzheimer's disease, unspecified: Secondary | ICD-10-CM | POA: Diagnosis not present

## 2017-11-05 DIAGNOSIS — G309 Alzheimer's disease, unspecified: Secondary | ICD-10-CM | POA: Diagnosis not present

## 2017-11-06 DIAGNOSIS — G309 Alzheimer's disease, unspecified: Secondary | ICD-10-CM | POA: Diagnosis not present

## 2017-11-07 DIAGNOSIS — G309 Alzheimer's disease, unspecified: Secondary | ICD-10-CM | POA: Diagnosis not present

## 2017-11-08 DIAGNOSIS — G309 Alzheimer's disease, unspecified: Secondary | ICD-10-CM | POA: Diagnosis not present

## 2017-11-09 DIAGNOSIS — G309 Alzheimer's disease, unspecified: Secondary | ICD-10-CM | POA: Diagnosis not present

## 2017-11-10 DIAGNOSIS — G309 Alzheimer's disease, unspecified: Secondary | ICD-10-CM | POA: Diagnosis not present

## 2017-11-10 DIAGNOSIS — N182 Chronic kidney disease, stage 2 (mild): Secondary | ICD-10-CM | POA: Diagnosis not present

## 2017-11-10 DIAGNOSIS — F0281 Dementia in other diseases classified elsewhere with behavioral disturbance: Secondary | ICD-10-CM | POA: Diagnosis not present

## 2017-11-10 DIAGNOSIS — I10 Essential (primary) hypertension: Secondary | ICD-10-CM | POA: Diagnosis not present

## 2017-11-11 DIAGNOSIS — G309 Alzheimer's disease, unspecified: Secondary | ICD-10-CM | POA: Diagnosis not present

## 2017-11-12 DIAGNOSIS — G309 Alzheimer's disease, unspecified: Secondary | ICD-10-CM | POA: Diagnosis not present

## 2017-11-13 DIAGNOSIS — G309 Alzheimer's disease, unspecified: Secondary | ICD-10-CM | POA: Diagnosis not present

## 2017-11-14 DIAGNOSIS — G309 Alzheimer's disease, unspecified: Secondary | ICD-10-CM | POA: Diagnosis not present

## 2017-11-15 DIAGNOSIS — G309 Alzheimer's disease, unspecified: Secondary | ICD-10-CM | POA: Diagnosis not present

## 2017-11-16 DIAGNOSIS — G309 Alzheimer's disease, unspecified: Secondary | ICD-10-CM | POA: Diagnosis not present

## 2017-11-17 DIAGNOSIS — E1165 Type 2 diabetes mellitus with hyperglycemia: Secondary | ICD-10-CM | POA: Diagnosis not present

## 2017-11-17 DIAGNOSIS — G309 Alzheimer's disease, unspecified: Secondary | ICD-10-CM | POA: Diagnosis not present

## 2017-11-17 DIAGNOSIS — E119 Type 2 diabetes mellitus without complications: Secondary | ICD-10-CM | POA: Diagnosis not present

## 2017-11-17 DIAGNOSIS — E038 Other specified hypothyroidism: Secondary | ICD-10-CM | POA: Diagnosis not present

## 2017-11-17 DIAGNOSIS — I10 Essential (primary) hypertension: Secondary | ICD-10-CM | POA: Diagnosis not present

## 2017-11-18 DIAGNOSIS — G309 Alzheimer's disease, unspecified: Secondary | ICD-10-CM | POA: Diagnosis not present

## 2017-11-19 DIAGNOSIS — G309 Alzheimer's disease, unspecified: Secondary | ICD-10-CM | POA: Diagnosis not present

## 2017-11-20 DIAGNOSIS — G309 Alzheimer's disease, unspecified: Secondary | ICD-10-CM | POA: Diagnosis not present

## 2017-11-21 DIAGNOSIS — G309 Alzheimer's disease, unspecified: Secondary | ICD-10-CM | POA: Diagnosis not present

## 2017-11-22 DIAGNOSIS — G309 Alzheimer's disease, unspecified: Secondary | ICD-10-CM | POA: Diagnosis not present

## 2017-11-23 DIAGNOSIS — F0391 Unspecified dementia with behavioral disturbance: Secondary | ICD-10-CM | POA: Diagnosis not present

## 2017-11-23 DIAGNOSIS — F0630 Mood disorder due to known physiological condition, unspecified: Secondary | ICD-10-CM | POA: Diagnosis not present

## 2017-11-23 DIAGNOSIS — G309 Alzheimer's disease, unspecified: Secondary | ICD-10-CM | POA: Diagnosis not present

## 2017-11-23 DIAGNOSIS — F419 Anxiety disorder, unspecified: Secondary | ICD-10-CM | POA: Diagnosis not present

## 2017-11-23 DIAGNOSIS — R451 Restlessness and agitation: Secondary | ICD-10-CM | POA: Diagnosis not present

## 2017-11-24 DIAGNOSIS — G309 Alzheimer's disease, unspecified: Secondary | ICD-10-CM | POA: Diagnosis not present

## 2017-11-25 DIAGNOSIS — D518 Other vitamin B12 deficiency anemias: Secondary | ICD-10-CM | POA: Diagnosis not present

## 2017-11-25 DIAGNOSIS — Z79899 Other long term (current) drug therapy: Secondary | ICD-10-CM | POA: Diagnosis not present

## 2017-11-25 DIAGNOSIS — E119 Type 2 diabetes mellitus without complications: Secondary | ICD-10-CM | POA: Diagnosis not present

## 2017-11-25 DIAGNOSIS — E559 Vitamin D deficiency, unspecified: Secondary | ICD-10-CM | POA: Diagnosis not present

## 2017-11-25 DIAGNOSIS — G309 Alzheimer's disease, unspecified: Secondary | ICD-10-CM | POA: Diagnosis not present

## 2017-11-25 DIAGNOSIS — E038 Other specified hypothyroidism: Secondary | ICD-10-CM | POA: Diagnosis not present

## 2017-11-25 DIAGNOSIS — E7849 Other hyperlipidemia: Secondary | ICD-10-CM | POA: Diagnosis not present

## 2017-11-26 DIAGNOSIS — G309 Alzheimer's disease, unspecified: Secondary | ICD-10-CM | POA: Diagnosis not present

## 2017-11-27 DIAGNOSIS — G309 Alzheimer's disease, unspecified: Secondary | ICD-10-CM | POA: Diagnosis not present

## 2017-11-28 DIAGNOSIS — G309 Alzheimer's disease, unspecified: Secondary | ICD-10-CM | POA: Diagnosis not present

## 2017-11-29 DIAGNOSIS — G309 Alzheimer's disease, unspecified: Secondary | ICD-10-CM | POA: Diagnosis not present

## 2017-11-30 DIAGNOSIS — G309 Alzheimer's disease, unspecified: Secondary | ICD-10-CM | POA: Diagnosis not present

## 2017-12-01 DIAGNOSIS — G309 Alzheimer's disease, unspecified: Secondary | ICD-10-CM | POA: Diagnosis not present

## 2017-12-02 DIAGNOSIS — G309 Alzheimer's disease, unspecified: Secondary | ICD-10-CM | POA: Diagnosis not present

## 2017-12-03 DIAGNOSIS — R32 Unspecified urinary incontinence: Secondary | ICD-10-CM | POA: Diagnosis not present

## 2017-12-03 DIAGNOSIS — R159 Full incontinence of feces: Secondary | ICD-10-CM | POA: Diagnosis not present

## 2017-12-03 DIAGNOSIS — G309 Alzheimer's disease, unspecified: Secondary | ICD-10-CM | POA: Diagnosis not present

## 2017-12-04 DIAGNOSIS — G309 Alzheimer's disease, unspecified: Secondary | ICD-10-CM | POA: Diagnosis not present

## 2017-12-05 DIAGNOSIS — G309 Alzheimer's disease, unspecified: Secondary | ICD-10-CM | POA: Diagnosis not present

## 2017-12-06 DIAGNOSIS — G309 Alzheimer's disease, unspecified: Secondary | ICD-10-CM | POA: Diagnosis not present

## 2017-12-07 DIAGNOSIS — D518 Other vitamin B12 deficiency anemias: Secondary | ICD-10-CM | POA: Diagnosis not present

## 2017-12-07 DIAGNOSIS — G309 Alzheimer's disease, unspecified: Secondary | ICD-10-CM | POA: Diagnosis not present

## 2017-12-07 DIAGNOSIS — E038 Other specified hypothyroidism: Secondary | ICD-10-CM | POA: Diagnosis not present

## 2017-12-07 DIAGNOSIS — Z79899 Other long term (current) drug therapy: Secondary | ICD-10-CM | POA: Diagnosis not present

## 2017-12-07 DIAGNOSIS — E119 Type 2 diabetes mellitus without complications: Secondary | ICD-10-CM | POA: Diagnosis not present

## 2017-12-08 DIAGNOSIS — N183 Chronic kidney disease, stage 3 (moderate): Secondary | ICD-10-CM | POA: Diagnosis not present

## 2017-12-08 DIAGNOSIS — I10 Essential (primary) hypertension: Secondary | ICD-10-CM | POA: Diagnosis not present

## 2017-12-08 DIAGNOSIS — F0280 Dementia in other diseases classified elsewhere without behavioral disturbance: Secondary | ICD-10-CM | POA: Diagnosis not present

## 2017-12-08 DIAGNOSIS — E559 Vitamin D deficiency, unspecified: Secondary | ICD-10-CM | POA: Diagnosis not present

## 2017-12-08 DIAGNOSIS — E119 Type 2 diabetes mellitus without complications: Secondary | ICD-10-CM | POA: Diagnosis not present

## 2017-12-08 DIAGNOSIS — D518 Other vitamin B12 deficiency anemias: Secondary | ICD-10-CM | POA: Diagnosis not present

## 2017-12-08 DIAGNOSIS — Z79899 Other long term (current) drug therapy: Secondary | ICD-10-CM | POA: Diagnosis not present

## 2017-12-08 DIAGNOSIS — G309 Alzheimer's disease, unspecified: Secondary | ICD-10-CM | POA: Diagnosis not present

## 2017-12-08 DIAGNOSIS — E7849 Other hyperlipidemia: Secondary | ICD-10-CM | POA: Diagnosis not present

## 2017-12-08 DIAGNOSIS — E038 Other specified hypothyroidism: Secondary | ICD-10-CM | POA: Diagnosis not present

## 2017-12-09 DIAGNOSIS — G309 Alzheimer's disease, unspecified: Secondary | ICD-10-CM | POA: Diagnosis not present

## 2017-12-10 DIAGNOSIS — G309 Alzheimer's disease, unspecified: Secondary | ICD-10-CM | POA: Diagnosis not present

## 2017-12-11 DIAGNOSIS — G309 Alzheimer's disease, unspecified: Secondary | ICD-10-CM | POA: Diagnosis not present

## 2017-12-12 DIAGNOSIS — G309 Alzheimer's disease, unspecified: Secondary | ICD-10-CM | POA: Diagnosis not present

## 2017-12-13 DIAGNOSIS — G309 Alzheimer's disease, unspecified: Secondary | ICD-10-CM | POA: Diagnosis not present

## 2017-12-14 DIAGNOSIS — G309 Alzheimer's disease, unspecified: Secondary | ICD-10-CM | POA: Diagnosis not present

## 2017-12-15 DIAGNOSIS — G309 Alzheimer's disease, unspecified: Secondary | ICD-10-CM | POA: Diagnosis not present

## 2017-12-16 DIAGNOSIS — G309 Alzheimer's disease, unspecified: Secondary | ICD-10-CM | POA: Diagnosis not present

## 2017-12-17 DIAGNOSIS — G309 Alzheimer's disease, unspecified: Secondary | ICD-10-CM | POA: Diagnosis not present

## 2017-12-18 DIAGNOSIS — G309 Alzheimer's disease, unspecified: Secondary | ICD-10-CM | POA: Diagnosis not present

## 2017-12-19 DIAGNOSIS — E1165 Type 2 diabetes mellitus with hyperglycemia: Secondary | ICD-10-CM | POA: Diagnosis not present

## 2017-12-19 DIAGNOSIS — E038 Other specified hypothyroidism: Secondary | ICD-10-CM | POA: Diagnosis not present

## 2017-12-19 DIAGNOSIS — I10 Essential (primary) hypertension: Secondary | ICD-10-CM | POA: Diagnosis not present

## 2017-12-19 DIAGNOSIS — E119 Type 2 diabetes mellitus without complications: Secondary | ICD-10-CM | POA: Diagnosis not present

## 2017-12-19 DIAGNOSIS — G309 Alzheimer's disease, unspecified: Secondary | ICD-10-CM | POA: Diagnosis not present

## 2017-12-20 DIAGNOSIS — G309 Alzheimer's disease, unspecified: Secondary | ICD-10-CM | POA: Diagnosis not present

## 2017-12-21 DIAGNOSIS — E7849 Other hyperlipidemia: Secondary | ICD-10-CM | POA: Diagnosis not present

## 2017-12-21 DIAGNOSIS — G309 Alzheimer's disease, unspecified: Secondary | ICD-10-CM | POA: Diagnosis not present

## 2017-12-21 DIAGNOSIS — E119 Type 2 diabetes mellitus without complications: Secondary | ICD-10-CM | POA: Diagnosis not present

## 2017-12-21 DIAGNOSIS — D518 Other vitamin B12 deficiency anemias: Secondary | ICD-10-CM | POA: Diagnosis not present

## 2017-12-21 DIAGNOSIS — Z79899 Other long term (current) drug therapy: Secondary | ICD-10-CM | POA: Diagnosis not present

## 2017-12-22 DIAGNOSIS — G309 Alzheimer's disease, unspecified: Secondary | ICD-10-CM | POA: Diagnosis not present

## 2017-12-23 DIAGNOSIS — G309 Alzheimer's disease, unspecified: Secondary | ICD-10-CM | POA: Diagnosis not present

## 2017-12-24 DIAGNOSIS — G309 Alzheimer's disease, unspecified: Secondary | ICD-10-CM | POA: Diagnosis not present

## 2017-12-25 DIAGNOSIS — G309 Alzheimer's disease, unspecified: Secondary | ICD-10-CM | POA: Diagnosis not present

## 2017-12-26 DIAGNOSIS — G309 Alzheimer's disease, unspecified: Secondary | ICD-10-CM | POA: Diagnosis not present

## 2017-12-27 DIAGNOSIS — F419 Anxiety disorder, unspecified: Secondary | ICD-10-CM | POA: Diagnosis not present

## 2017-12-27 DIAGNOSIS — G309 Alzheimer's disease, unspecified: Secondary | ICD-10-CM | POA: Diagnosis not present

## 2017-12-27 DIAGNOSIS — F0391 Unspecified dementia with behavioral disturbance: Secondary | ICD-10-CM | POA: Diagnosis not present

## 2017-12-27 DIAGNOSIS — F0630 Mood disorder due to known physiological condition, unspecified: Secondary | ICD-10-CM | POA: Diagnosis not present

## 2017-12-28 DIAGNOSIS — G309 Alzheimer's disease, unspecified: Secondary | ICD-10-CM | POA: Diagnosis not present

## 2017-12-29 DIAGNOSIS — G309 Alzheimer's disease, unspecified: Secondary | ICD-10-CM | POA: Diagnosis not present

## 2017-12-30 DIAGNOSIS — G309 Alzheimer's disease, unspecified: Secondary | ICD-10-CM | POA: Diagnosis not present

## 2017-12-31 DIAGNOSIS — G309 Alzheimer's disease, unspecified: Secondary | ICD-10-CM | POA: Diagnosis not present

## 2018-01-01 DIAGNOSIS — G309 Alzheimer's disease, unspecified: Secondary | ICD-10-CM | POA: Diagnosis not present

## 2018-01-02 DIAGNOSIS — G309 Alzheimer's disease, unspecified: Secondary | ICD-10-CM | POA: Diagnosis not present

## 2018-01-03 DIAGNOSIS — G309 Alzheimer's disease, unspecified: Secondary | ICD-10-CM | POA: Diagnosis not present

## 2018-01-03 DIAGNOSIS — R32 Unspecified urinary incontinence: Secondary | ICD-10-CM | POA: Diagnosis not present

## 2018-01-03 DIAGNOSIS — R159 Full incontinence of feces: Secondary | ICD-10-CM | POA: Diagnosis not present

## 2018-01-04 DIAGNOSIS — G309 Alzheimer's disease, unspecified: Secondary | ICD-10-CM | POA: Diagnosis not present

## 2018-01-05 DIAGNOSIS — G309 Alzheimer's disease, unspecified: Secondary | ICD-10-CM | POA: Diagnosis not present

## 2018-01-05 DIAGNOSIS — F0281 Dementia in other diseases classified elsewhere with behavioral disturbance: Secondary | ICD-10-CM | POA: Diagnosis not present

## 2018-01-05 DIAGNOSIS — I10 Essential (primary) hypertension: Secondary | ICD-10-CM | POA: Diagnosis not present

## 2018-01-05 DIAGNOSIS — N182 Chronic kidney disease, stage 2 (mild): Secondary | ICD-10-CM | POA: Diagnosis not present

## 2018-01-06 DIAGNOSIS — G309 Alzheimer's disease, unspecified: Secondary | ICD-10-CM | POA: Diagnosis not present

## 2018-01-07 DIAGNOSIS — G309 Alzheimer's disease, unspecified: Secondary | ICD-10-CM | POA: Diagnosis not present

## 2018-01-08 DIAGNOSIS — G309 Alzheimer's disease, unspecified: Secondary | ICD-10-CM | POA: Diagnosis not present

## 2018-01-09 DIAGNOSIS — G309 Alzheimer's disease, unspecified: Secondary | ICD-10-CM | POA: Diagnosis not present

## 2018-01-10 DIAGNOSIS — G309 Alzheimer's disease, unspecified: Secondary | ICD-10-CM | POA: Diagnosis not present

## 2018-01-11 DIAGNOSIS — G309 Alzheimer's disease, unspecified: Secondary | ICD-10-CM | POA: Diagnosis not present

## 2018-01-12 DIAGNOSIS — G309 Alzheimer's disease, unspecified: Secondary | ICD-10-CM | POA: Diagnosis not present

## 2018-01-13 DIAGNOSIS — G309 Alzheimer's disease, unspecified: Secondary | ICD-10-CM | POA: Diagnosis not present

## 2018-01-14 DIAGNOSIS — G309 Alzheimer's disease, unspecified: Secondary | ICD-10-CM | POA: Diagnosis not present

## 2018-01-15 DIAGNOSIS — G309 Alzheimer's disease, unspecified: Secondary | ICD-10-CM | POA: Diagnosis not present

## 2018-01-18 DIAGNOSIS — E7849 Other hyperlipidemia: Secondary | ICD-10-CM | POA: Diagnosis not present

## 2018-01-18 DIAGNOSIS — D518 Other vitamin B12 deficiency anemias: Secondary | ICD-10-CM | POA: Diagnosis not present

## 2018-01-18 DIAGNOSIS — E119 Type 2 diabetes mellitus without complications: Secondary | ICD-10-CM | POA: Diagnosis not present

## 2018-01-18 DIAGNOSIS — Z79899 Other long term (current) drug therapy: Secondary | ICD-10-CM | POA: Diagnosis not present

## 2018-01-26 DIAGNOSIS — G309 Alzheimer's disease, unspecified: Secondary | ICD-10-CM | POA: Diagnosis not present

## 2018-01-27 DIAGNOSIS — G309 Alzheimer's disease, unspecified: Secondary | ICD-10-CM | POA: Diagnosis not present

## 2018-01-28 DIAGNOSIS — G309 Alzheimer's disease, unspecified: Secondary | ICD-10-CM | POA: Diagnosis not present

## 2018-01-29 DIAGNOSIS — G309 Alzheimer's disease, unspecified: Secondary | ICD-10-CM | POA: Diagnosis not present

## 2018-01-30 DIAGNOSIS — G309 Alzheimer's disease, unspecified: Secondary | ICD-10-CM | POA: Diagnosis not present

## 2018-01-30 DIAGNOSIS — R159 Full incontinence of feces: Secondary | ICD-10-CM | POA: Diagnosis not present

## 2018-01-30 DIAGNOSIS — R32 Unspecified urinary incontinence: Secondary | ICD-10-CM | POA: Diagnosis not present

## 2018-01-31 DIAGNOSIS — F0630 Mood disorder due to known physiological condition, unspecified: Secondary | ICD-10-CM | POA: Diagnosis not present

## 2018-01-31 DIAGNOSIS — F0391 Unspecified dementia with behavioral disturbance: Secondary | ICD-10-CM | POA: Diagnosis not present

## 2018-01-31 DIAGNOSIS — G309 Alzheimer's disease, unspecified: Secondary | ICD-10-CM | POA: Diagnosis not present

## 2018-01-31 DIAGNOSIS — F419 Anxiety disorder, unspecified: Secondary | ICD-10-CM | POA: Diagnosis not present

## 2018-02-01 DIAGNOSIS — G309 Alzheimer's disease, unspecified: Secondary | ICD-10-CM | POA: Diagnosis not present

## 2018-02-02 DIAGNOSIS — F0281 Dementia in other diseases classified elsewhere with behavioral disturbance: Secondary | ICD-10-CM | POA: Diagnosis not present

## 2018-02-02 DIAGNOSIS — G309 Alzheimer's disease, unspecified: Secondary | ICD-10-CM | POA: Diagnosis not present

## 2018-02-02 DIAGNOSIS — I10 Essential (primary) hypertension: Secondary | ICD-10-CM | POA: Diagnosis not present

## 2018-02-02 DIAGNOSIS — N182 Chronic kidney disease, stage 2 (mild): Secondary | ICD-10-CM | POA: Diagnosis not present

## 2018-02-03 DIAGNOSIS — G309 Alzheimer's disease, unspecified: Secondary | ICD-10-CM | POA: Diagnosis not present

## 2018-02-04 DIAGNOSIS — G309 Alzheimer's disease, unspecified: Secondary | ICD-10-CM | POA: Diagnosis not present

## 2018-02-05 DIAGNOSIS — G309 Alzheimer's disease, unspecified: Secondary | ICD-10-CM | POA: Diagnosis not present

## 2018-02-06 DIAGNOSIS — G309 Alzheimer's disease, unspecified: Secondary | ICD-10-CM | POA: Diagnosis not present

## 2018-02-07 DIAGNOSIS — G309 Alzheimer's disease, unspecified: Secondary | ICD-10-CM | POA: Diagnosis not present

## 2018-02-08 DIAGNOSIS — G309 Alzheimer's disease, unspecified: Secondary | ICD-10-CM | POA: Diagnosis not present

## 2018-02-09 DIAGNOSIS — G309 Alzheimer's disease, unspecified: Secondary | ICD-10-CM | POA: Diagnosis not present

## 2018-02-09 IMAGING — CT CT HEAD W/O CM
4 series · 16 of 47 positions shown, 18 images · non-contrast
Comparison: 03/03/2016.

CLINICAL DATA: Dementia.  Fell forward out of wheelchair.

EXAM:
CT HEAD WITHOUT CONTRAST
TECHNIQUE: Contiguous axial images were obtained from the base of the skull
through the vertex without intravenous contrast.

[Series 2: head trauma wo · axial · 0.40mm/px · z∈[+214,+334]mm · 7 of 32 slices shown, 9 images]
[im 4/32  brain]
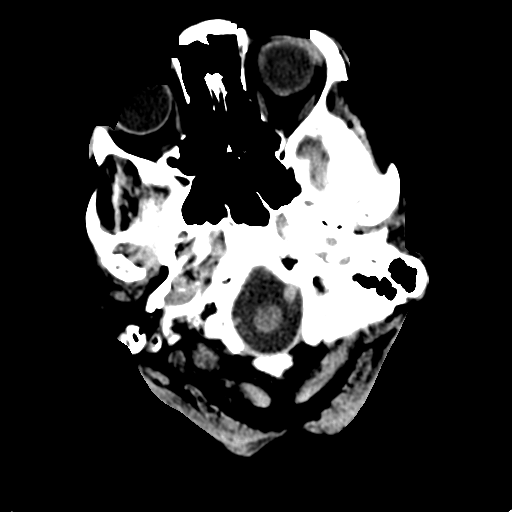
[im 4/32  bone]
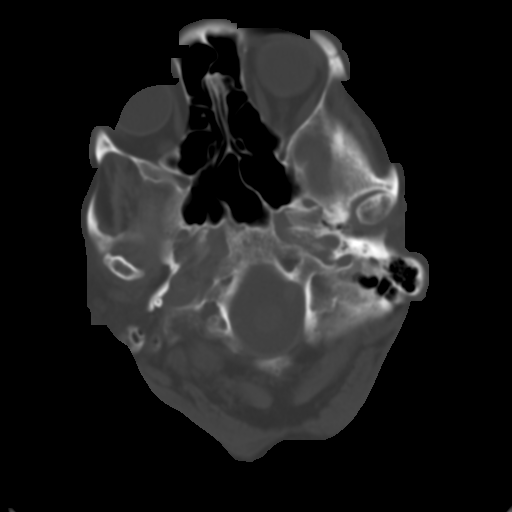
[im 8/32  brain]
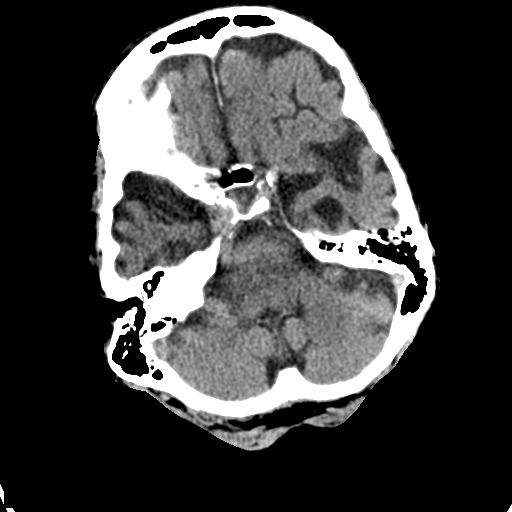
[im 12/32  brain]
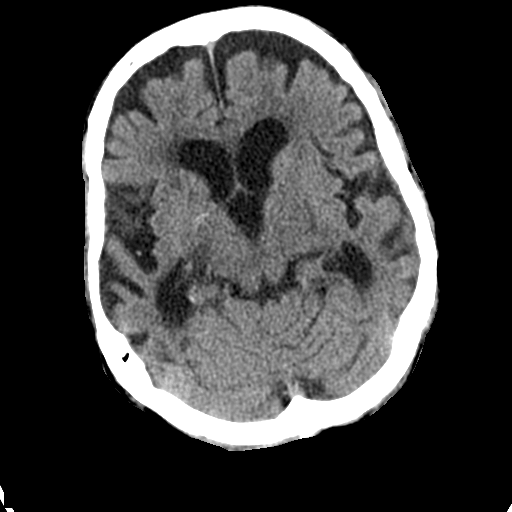
[im 16/32  brain]
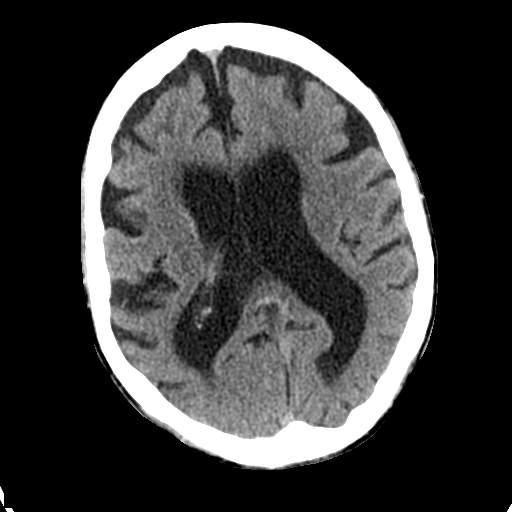
[im 20/32  brain]
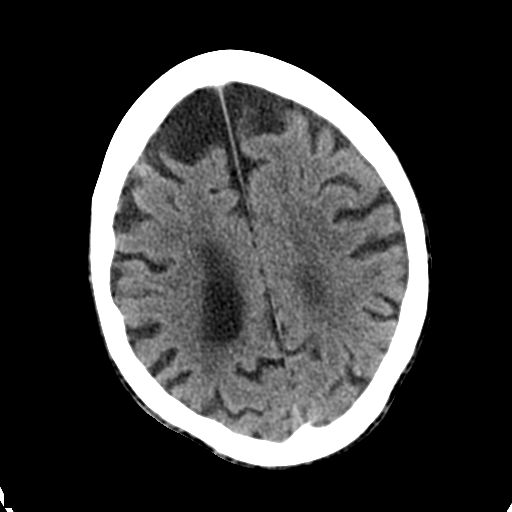
[im 20/32  bone]
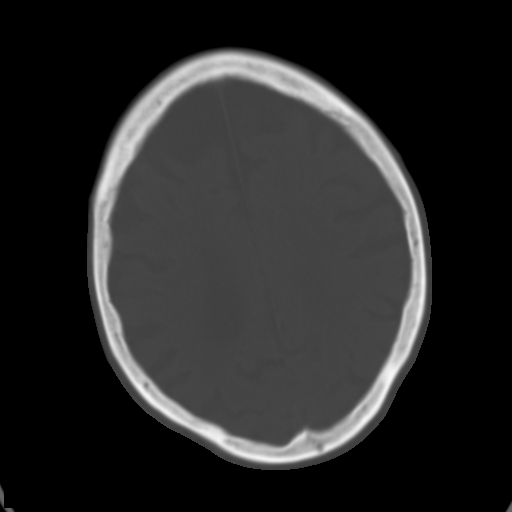
[im 24/32  brain]
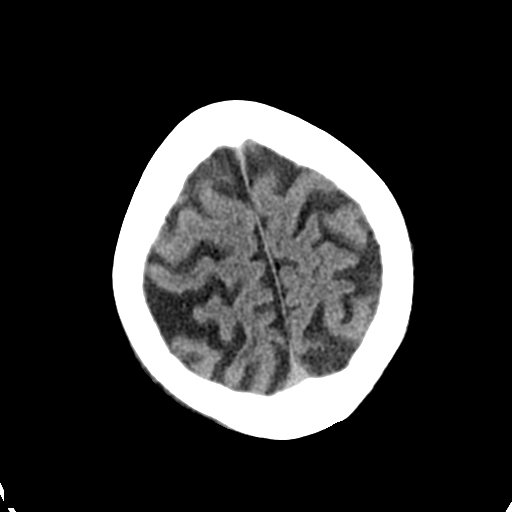
[im 28/32  brain]
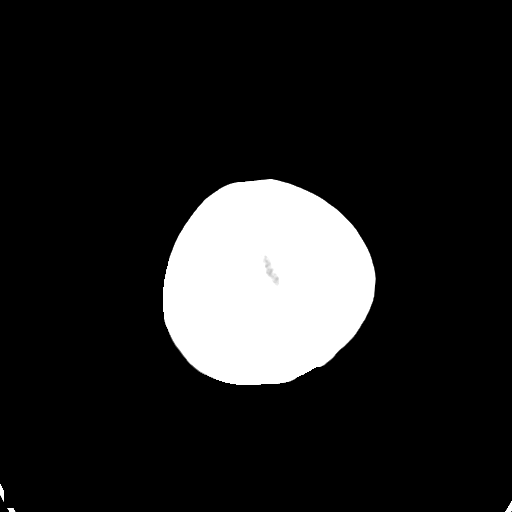

[Series 3: head bone · axial · 0.40mm/px · z∈[+213,+245]mm · 3 of 79 slices shown]
[im 8/79  bone]
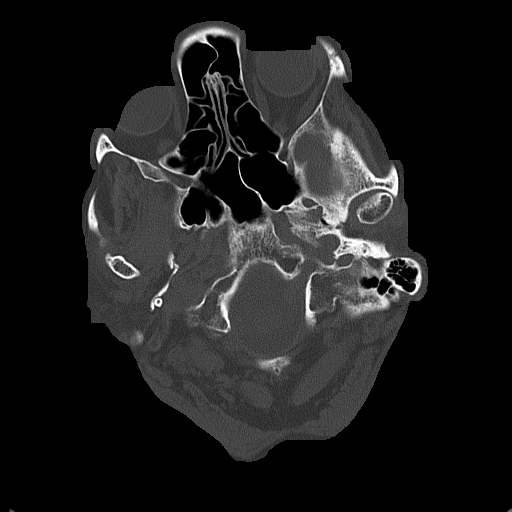
[im 16/79  bone]
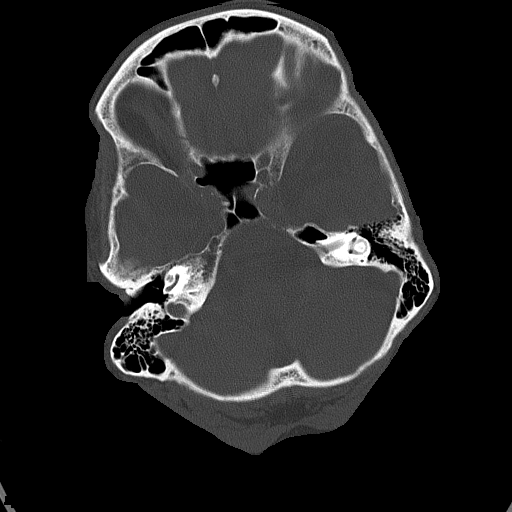
[im 24/79  bone]
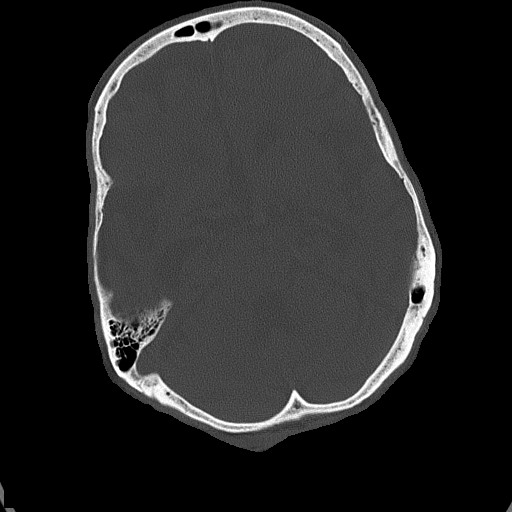

[Series 4: coronal soft tissue · coronal · 0.34mm/px · 3 of 67 slices shown]
[im 23/67  brain]
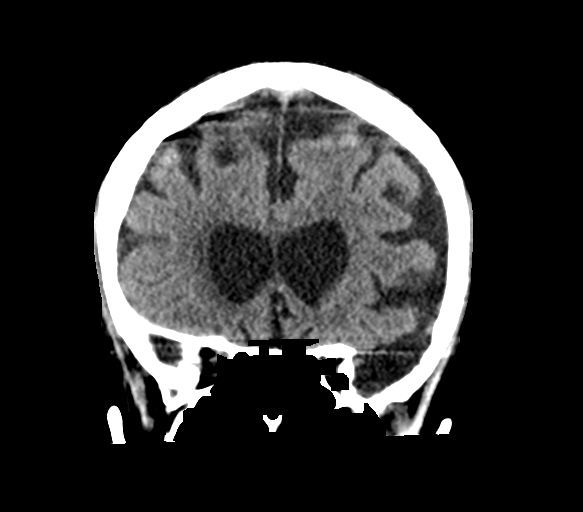
[im 30/67  brain]
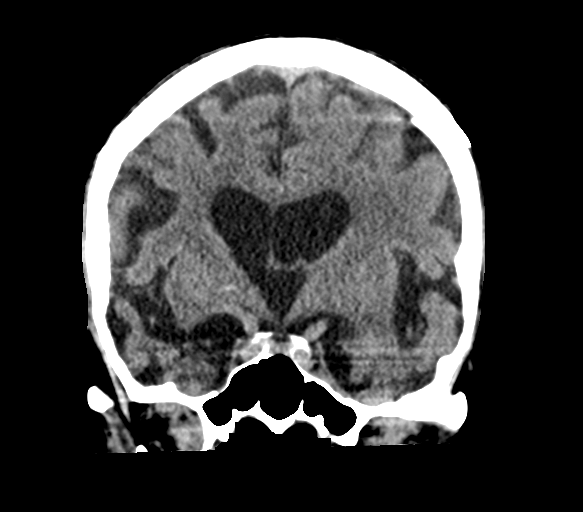
[im 37/67  brain]
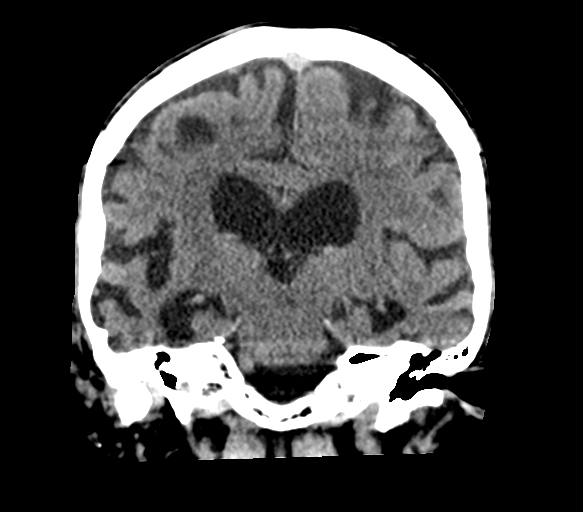

[Series 5: sagittal soft tissue · sagittal · 0.32mm/px · 3 of 58 slices shown]
[im 20/58  brain]
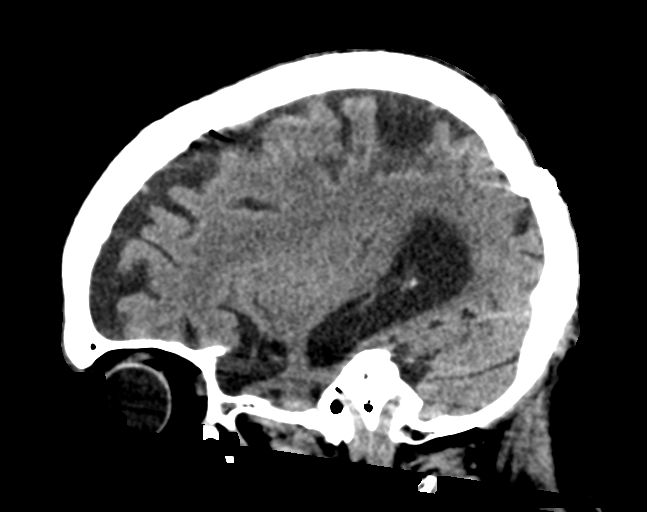
[im 29/58  brain]
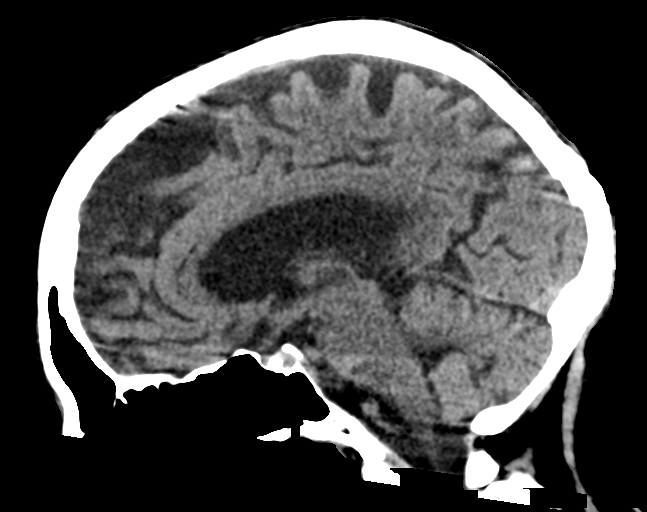
[im 39/58  brain]
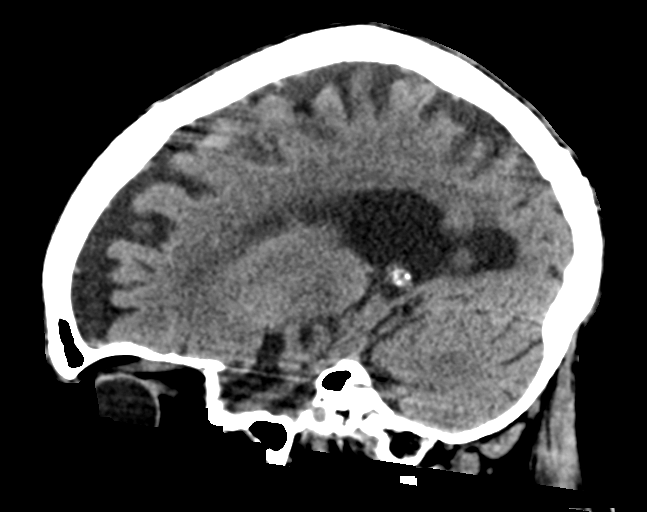

[16 of 47 positions shown; findings below may reference images not displayed]

FINDINGS: Brain: No evidence for acute infarction, hemorrhage, mass lesion,
hydrocephalus, or extra-axial fluid.

Advanced atrophy. Extreme brain substance loss in the temporal
lobes. Hypoattenuation of white matter representing small vessel
disease.

Vascular: No hyperdense vessel or unexpected calcification.

Skull: Normal. Negative for fracture or focal lesion.

Sinuses/Orbits: No acute finding.

Other: None.
IMPRESSION: Advanced Atrophy and small vessel disease, similar to priors. No
acute intracranial findings.

No skull fracture or intracranial hemorrhage.

## 2018-02-10 DIAGNOSIS — G309 Alzheimer's disease, unspecified: Secondary | ICD-10-CM | POA: Diagnosis not present

## 2018-02-11 DIAGNOSIS — G309 Alzheimer's disease, unspecified: Secondary | ICD-10-CM | POA: Diagnosis not present

## 2018-02-12 DIAGNOSIS — G309 Alzheimer's disease, unspecified: Secondary | ICD-10-CM | POA: Diagnosis not present

## 2018-02-13 DIAGNOSIS — G309 Alzheimer's disease, unspecified: Secondary | ICD-10-CM | POA: Diagnosis not present

## 2018-02-14 DIAGNOSIS — G309 Alzheimer's disease, unspecified: Secondary | ICD-10-CM | POA: Diagnosis not present

## 2018-02-15 DIAGNOSIS — G309 Alzheimer's disease, unspecified: Secondary | ICD-10-CM | POA: Diagnosis not present

## 2018-02-16 DIAGNOSIS — Z79899 Other long term (current) drug therapy: Secondary | ICD-10-CM | POA: Diagnosis not present

## 2018-02-16 DIAGNOSIS — G309 Alzheimer's disease, unspecified: Secondary | ICD-10-CM | POA: Diagnosis not present

## 2018-02-16 DIAGNOSIS — E782 Mixed hyperlipidemia: Secondary | ICD-10-CM | POA: Diagnosis not present

## 2018-02-17 DIAGNOSIS — G309 Alzheimer's disease, unspecified: Secondary | ICD-10-CM | POA: Diagnosis not present

## 2018-02-18 DIAGNOSIS — G309 Alzheimer's disease, unspecified: Secondary | ICD-10-CM | POA: Diagnosis not present

## 2018-02-19 DIAGNOSIS — G309 Alzheimer's disease, unspecified: Secondary | ICD-10-CM | POA: Diagnosis not present

## 2018-02-20 DIAGNOSIS — G309 Alzheimer's disease, unspecified: Secondary | ICD-10-CM | POA: Diagnosis not present

## 2018-02-21 DIAGNOSIS — G309 Alzheimer's disease, unspecified: Secondary | ICD-10-CM | POA: Diagnosis not present

## 2018-02-22 DIAGNOSIS — G309 Alzheimer's disease, unspecified: Secondary | ICD-10-CM | POA: Diagnosis not present

## 2018-02-23 DIAGNOSIS — G309 Alzheimer's disease, unspecified: Secondary | ICD-10-CM | POA: Diagnosis not present

## 2018-02-24 DIAGNOSIS — G309 Alzheimer's disease, unspecified: Secondary | ICD-10-CM | POA: Diagnosis not present

## 2018-02-25 DIAGNOSIS — G309 Alzheimer's disease, unspecified: Secondary | ICD-10-CM | POA: Diagnosis not present

## 2018-02-26 DIAGNOSIS — G309 Alzheimer's disease, unspecified: Secondary | ICD-10-CM | POA: Diagnosis not present

## 2018-02-27 DIAGNOSIS — G309 Alzheimer's disease, unspecified: Secondary | ICD-10-CM | POA: Diagnosis not present

## 2018-02-28 DIAGNOSIS — G309 Alzheimer's disease, unspecified: Secondary | ICD-10-CM | POA: Diagnosis not present

## 2018-03-01 DIAGNOSIS — G309 Alzheimer's disease, unspecified: Secondary | ICD-10-CM | POA: Diagnosis not present

## 2018-03-02 DIAGNOSIS — F0281 Dementia in other diseases classified elsewhere with behavioral disturbance: Secondary | ICD-10-CM | POA: Diagnosis not present

## 2018-03-02 DIAGNOSIS — S0081XA Abrasion of other part of head, initial encounter: Secondary | ICD-10-CM | POA: Diagnosis not present

## 2018-03-02 DIAGNOSIS — G309 Alzheimer's disease, unspecified: Secondary | ICD-10-CM | POA: Diagnosis not present

## 2018-03-02 DIAGNOSIS — I10 Essential (primary) hypertension: Secondary | ICD-10-CM | POA: Diagnosis not present

## 2018-03-02 DIAGNOSIS — F419 Anxiety disorder, unspecified: Secondary | ICD-10-CM | POA: Diagnosis not present

## 2018-03-03 DIAGNOSIS — F419 Anxiety disorder, unspecified: Secondary | ICD-10-CM | POA: Diagnosis not present

## 2018-03-03 DIAGNOSIS — G309 Alzheimer's disease, unspecified: Secondary | ICD-10-CM | POA: Diagnosis not present

## 2018-03-03 DIAGNOSIS — F0630 Mood disorder due to known physiological condition, unspecified: Secondary | ICD-10-CM | POA: Diagnosis not present

## 2018-03-03 DIAGNOSIS — F0391 Unspecified dementia with behavioral disturbance: Secondary | ICD-10-CM | POA: Diagnosis not present

## 2018-03-04 DIAGNOSIS — G309 Alzheimer's disease, unspecified: Secondary | ICD-10-CM | POA: Diagnosis not present

## 2018-03-05 DIAGNOSIS — G309 Alzheimer's disease, unspecified: Secondary | ICD-10-CM | POA: Diagnosis not present

## 2018-03-07 DIAGNOSIS — R159 Full incontinence of feces: Secondary | ICD-10-CM | POA: Diagnosis not present

## 2018-03-07 DIAGNOSIS — R32 Unspecified urinary incontinence: Secondary | ICD-10-CM | POA: Diagnosis not present

## 2018-03-09 DIAGNOSIS — E559 Vitamin D deficiency, unspecified: Secondary | ICD-10-CM | POA: Diagnosis not present

## 2018-03-09 DIAGNOSIS — D518 Other vitamin B12 deficiency anemias: Secondary | ICD-10-CM | POA: Diagnosis not present

## 2018-03-09 DIAGNOSIS — E7849 Other hyperlipidemia: Secondary | ICD-10-CM | POA: Diagnosis not present

## 2018-03-09 DIAGNOSIS — Z79899 Other long term (current) drug therapy: Secondary | ICD-10-CM | POA: Diagnosis not present

## 2018-03-09 DIAGNOSIS — E1165 Type 2 diabetes mellitus with hyperglycemia: Secondary | ICD-10-CM | POA: Diagnosis not present

## 2018-03-09 DIAGNOSIS — E038 Other specified hypothyroidism: Secondary | ICD-10-CM | POA: Diagnosis not present

## 2018-03-13 DIAGNOSIS — E1165 Type 2 diabetes mellitus with hyperglycemia: Secondary | ICD-10-CM | POA: Diagnosis not present

## 2018-03-13 DIAGNOSIS — I10 Essential (primary) hypertension: Secondary | ICD-10-CM | POA: Diagnosis not present

## 2018-03-13 DIAGNOSIS — E119 Type 2 diabetes mellitus without complications: Secondary | ICD-10-CM | POA: Diagnosis not present

## 2018-03-13 DIAGNOSIS — G309 Alzheimer's disease, unspecified: Secondary | ICD-10-CM | POA: Diagnosis not present

## 2018-03-13 DIAGNOSIS — E038 Other specified hypothyroidism: Secondary | ICD-10-CM | POA: Diagnosis not present

## 2018-03-14 DIAGNOSIS — G309 Alzheimer's disease, unspecified: Secondary | ICD-10-CM | POA: Diagnosis not present

## 2018-03-15 DIAGNOSIS — G309 Alzheimer's disease, unspecified: Secondary | ICD-10-CM | POA: Diagnosis not present

## 2018-03-16 DIAGNOSIS — G309 Alzheimer's disease, unspecified: Secondary | ICD-10-CM | POA: Diagnosis not present

## 2018-03-17 DIAGNOSIS — G309 Alzheimer's disease, unspecified: Secondary | ICD-10-CM | POA: Diagnosis not present

## 2018-03-18 DIAGNOSIS — G309 Alzheimer's disease, unspecified: Secondary | ICD-10-CM | POA: Diagnosis not present

## 2018-03-19 DIAGNOSIS — G309 Alzheimer's disease, unspecified: Secondary | ICD-10-CM | POA: Diagnosis not present

## 2018-03-20 DIAGNOSIS — G309 Alzheimer's disease, unspecified: Secondary | ICD-10-CM | POA: Diagnosis not present

## 2018-03-21 DIAGNOSIS — G309 Alzheimer's disease, unspecified: Secondary | ICD-10-CM | POA: Diagnosis not present

## 2018-03-22 DIAGNOSIS — E119 Type 2 diabetes mellitus without complications: Secondary | ICD-10-CM | POA: Diagnosis not present

## 2018-03-22 DIAGNOSIS — Z79899 Other long term (current) drug therapy: Secondary | ICD-10-CM | POA: Diagnosis not present

## 2018-03-22 DIAGNOSIS — D518 Other vitamin B12 deficiency anemias: Secondary | ICD-10-CM | POA: Diagnosis not present

## 2018-03-22 DIAGNOSIS — G309 Alzheimer's disease, unspecified: Secondary | ICD-10-CM | POA: Diagnosis not present

## 2018-03-22 DIAGNOSIS — E7849 Other hyperlipidemia: Secondary | ICD-10-CM | POA: Diagnosis not present

## 2018-03-23 DIAGNOSIS — G309 Alzheimer's disease, unspecified: Secondary | ICD-10-CM | POA: Diagnosis not present

## 2018-03-24 DIAGNOSIS — G309 Alzheimer's disease, unspecified: Secondary | ICD-10-CM | POA: Diagnosis not present

## 2018-03-25 DIAGNOSIS — G309 Alzheimer's disease, unspecified: Secondary | ICD-10-CM | POA: Diagnosis not present

## 2018-03-26 DIAGNOSIS — G309 Alzheimer's disease, unspecified: Secondary | ICD-10-CM | POA: Diagnosis not present

## 2018-03-27 DIAGNOSIS — G309 Alzheimer's disease, unspecified: Secondary | ICD-10-CM | POA: Diagnosis not present

## 2018-03-28 DIAGNOSIS — G309 Alzheimer's disease, unspecified: Secondary | ICD-10-CM | POA: Diagnosis not present

## 2018-03-29 DIAGNOSIS — G309 Alzheimer's disease, unspecified: Secondary | ICD-10-CM | POA: Diagnosis not present

## 2018-03-30 DIAGNOSIS — R159 Full incontinence of feces: Secondary | ICD-10-CM | POA: Diagnosis not present

## 2018-03-30 DIAGNOSIS — F0281 Dementia in other diseases classified elsewhere with behavioral disturbance: Secondary | ICD-10-CM | POA: Diagnosis not present

## 2018-03-30 DIAGNOSIS — F0391 Unspecified dementia with behavioral disturbance: Secondary | ICD-10-CM | POA: Diagnosis not present

## 2018-03-30 DIAGNOSIS — F419 Anxiety disorder, unspecified: Secondary | ICD-10-CM | POA: Diagnosis not present

## 2018-03-30 DIAGNOSIS — F0630 Mood disorder due to known physiological condition, unspecified: Secondary | ICD-10-CM | POA: Diagnosis not present

## 2018-03-30 DIAGNOSIS — I10 Essential (primary) hypertension: Secondary | ICD-10-CM | POA: Diagnosis not present

## 2018-03-30 DIAGNOSIS — G309 Alzheimer's disease, unspecified: Secondary | ICD-10-CM | POA: Diagnosis not present

## 2018-03-30 DIAGNOSIS — R32 Unspecified urinary incontinence: Secondary | ICD-10-CM | POA: Diagnosis not present

## 2018-03-30 DIAGNOSIS — N182 Chronic kidney disease, stage 2 (mild): Secondary | ICD-10-CM | POA: Diagnosis not present

## 2018-03-31 DIAGNOSIS — G309 Alzheimer's disease, unspecified: Secondary | ICD-10-CM | POA: Diagnosis not present

## 2018-04-01 DIAGNOSIS — G309 Alzheimer's disease, unspecified: Secondary | ICD-10-CM | POA: Diagnosis not present

## 2018-04-02 DIAGNOSIS — G309 Alzheimer's disease, unspecified: Secondary | ICD-10-CM | POA: Diagnosis not present

## 2018-04-03 DIAGNOSIS — G309 Alzheimer's disease, unspecified: Secondary | ICD-10-CM | POA: Diagnosis not present

## 2018-04-04 DIAGNOSIS — G309 Alzheimer's disease, unspecified: Secondary | ICD-10-CM | POA: Diagnosis not present

## 2018-04-05 DIAGNOSIS — G309 Alzheimer's disease, unspecified: Secondary | ICD-10-CM | POA: Diagnosis not present

## 2018-04-06 DIAGNOSIS — G309 Alzheimer's disease, unspecified: Secondary | ICD-10-CM | POA: Diagnosis not present

## 2018-04-07 DIAGNOSIS — G309 Alzheimer's disease, unspecified: Secondary | ICD-10-CM | POA: Diagnosis not present

## 2018-04-08 DIAGNOSIS — G309 Alzheimer's disease, unspecified: Secondary | ICD-10-CM | POA: Diagnosis not present

## 2018-04-09 DIAGNOSIS — G309 Alzheimer's disease, unspecified: Secondary | ICD-10-CM | POA: Diagnosis not present

## 2018-04-10 DIAGNOSIS — G309 Alzheimer's disease, unspecified: Secondary | ICD-10-CM | POA: Diagnosis not present

## 2018-04-11 DIAGNOSIS — G309 Alzheimer's disease, unspecified: Secondary | ICD-10-CM | POA: Diagnosis not present

## 2018-04-12 DIAGNOSIS — G309 Alzheimer's disease, unspecified: Secondary | ICD-10-CM | POA: Diagnosis not present

## 2018-04-13 DIAGNOSIS — G309 Alzheimer's disease, unspecified: Secondary | ICD-10-CM | POA: Diagnosis not present

## 2018-04-14 DIAGNOSIS — G309 Alzheimer's disease, unspecified: Secondary | ICD-10-CM | POA: Diagnosis not present

## 2018-04-15 DIAGNOSIS — E119 Type 2 diabetes mellitus without complications: Secondary | ICD-10-CM | POA: Diagnosis not present

## 2018-04-15 DIAGNOSIS — E038 Other specified hypothyroidism: Secondary | ICD-10-CM | POA: Diagnosis not present

## 2018-04-15 DIAGNOSIS — I10 Essential (primary) hypertension: Secondary | ICD-10-CM | POA: Diagnosis not present

## 2018-04-15 DIAGNOSIS — G309 Alzheimer's disease, unspecified: Secondary | ICD-10-CM | POA: Diagnosis not present

## 2018-04-15 DIAGNOSIS — E1165 Type 2 diabetes mellitus with hyperglycemia: Secondary | ICD-10-CM | POA: Diagnosis not present

## 2018-04-16 DIAGNOSIS — G309 Alzheimer's disease, unspecified: Secondary | ICD-10-CM | POA: Diagnosis not present

## 2018-04-17 DIAGNOSIS — G309 Alzheimer's disease, unspecified: Secondary | ICD-10-CM | POA: Diagnosis not present

## 2018-04-18 DIAGNOSIS — G309 Alzheimer's disease, unspecified: Secondary | ICD-10-CM | POA: Diagnosis not present

## 2018-04-19 DIAGNOSIS — G309 Alzheimer's disease, unspecified: Secondary | ICD-10-CM | POA: Diagnosis not present

## 2018-04-20 DIAGNOSIS — G309 Alzheimer's disease, unspecified: Secondary | ICD-10-CM | POA: Diagnosis not present

## 2018-04-21 DIAGNOSIS — G309 Alzheimer's disease, unspecified: Secondary | ICD-10-CM | POA: Diagnosis not present

## 2018-04-22 DIAGNOSIS — G309 Alzheimer's disease, unspecified: Secondary | ICD-10-CM | POA: Diagnosis not present

## 2018-04-23 DIAGNOSIS — G309 Alzheimer's disease, unspecified: Secondary | ICD-10-CM | POA: Diagnosis not present

## 2018-04-24 DIAGNOSIS — G309 Alzheimer's disease, unspecified: Secondary | ICD-10-CM | POA: Diagnosis not present

## 2018-04-25 DIAGNOSIS — G309 Alzheimer's disease, unspecified: Secondary | ICD-10-CM | POA: Diagnosis not present

## 2018-04-26 DIAGNOSIS — G309 Alzheimer's disease, unspecified: Secondary | ICD-10-CM | POA: Diagnosis not present

## 2018-04-27 DIAGNOSIS — N182 Chronic kidney disease, stage 2 (mild): Secondary | ICD-10-CM | POA: Diagnosis not present

## 2018-04-27 DIAGNOSIS — I10 Essential (primary) hypertension: Secondary | ICD-10-CM | POA: Diagnosis not present

## 2018-04-27 DIAGNOSIS — G309 Alzheimer's disease, unspecified: Secondary | ICD-10-CM | POA: Diagnosis not present

## 2018-04-28 DIAGNOSIS — G309 Alzheimer's disease, unspecified: Secondary | ICD-10-CM | POA: Diagnosis not present

## 2018-04-29 DIAGNOSIS — G309 Alzheimer's disease, unspecified: Secondary | ICD-10-CM | POA: Diagnosis not present

## 2018-04-30 DIAGNOSIS — G309 Alzheimer's disease, unspecified: Secondary | ICD-10-CM | POA: Diagnosis not present

## 2018-05-01 DIAGNOSIS — R32 Unspecified urinary incontinence: Secondary | ICD-10-CM | POA: Diagnosis not present

## 2018-05-01 DIAGNOSIS — R159 Full incontinence of feces: Secondary | ICD-10-CM | POA: Diagnosis not present

## 2018-05-01 DIAGNOSIS — G309 Alzheimer's disease, unspecified: Secondary | ICD-10-CM | POA: Diagnosis not present

## 2018-05-02 DIAGNOSIS — G309 Alzheimer's disease, unspecified: Secondary | ICD-10-CM | POA: Diagnosis not present

## 2018-05-03 DIAGNOSIS — G309 Alzheimer's disease, unspecified: Secondary | ICD-10-CM | POA: Diagnosis not present

## 2018-05-04 DIAGNOSIS — G309 Alzheimer's disease, unspecified: Secondary | ICD-10-CM | POA: Diagnosis not present

## 2018-05-05 DIAGNOSIS — G309 Alzheimer's disease, unspecified: Secondary | ICD-10-CM | POA: Diagnosis not present

## 2018-05-06 DIAGNOSIS — G309 Alzheimer's disease, unspecified: Secondary | ICD-10-CM | POA: Diagnosis not present

## 2018-05-07 DIAGNOSIS — G309 Alzheimer's disease, unspecified: Secondary | ICD-10-CM | POA: Diagnosis not present

## 2018-05-09 DIAGNOSIS — E119 Type 2 diabetes mellitus without complications: Secondary | ICD-10-CM | POA: Diagnosis not present

## 2018-05-09 DIAGNOSIS — I10 Essential (primary) hypertension: Secondary | ICD-10-CM | POA: Diagnosis not present

## 2018-05-09 DIAGNOSIS — D518 Other vitamin B12 deficiency anemias: Secondary | ICD-10-CM | POA: Diagnosis not present

## 2018-05-09 DIAGNOSIS — E1165 Type 2 diabetes mellitus with hyperglycemia: Secondary | ICD-10-CM | POA: Diagnosis not present

## 2018-05-09 DIAGNOSIS — E038 Other specified hypothyroidism: Secondary | ICD-10-CM | POA: Diagnosis not present

## 2018-05-15 DIAGNOSIS — F0391 Unspecified dementia with behavioral disturbance: Secondary | ICD-10-CM | POA: Diagnosis not present

## 2018-05-15 DIAGNOSIS — F419 Anxiety disorder, unspecified: Secondary | ICD-10-CM | POA: Diagnosis not present

## 2018-05-15 DIAGNOSIS — F0630 Mood disorder due to known physiological condition, unspecified: Secondary | ICD-10-CM | POA: Diagnosis not present

## 2018-05-25 DIAGNOSIS — R269 Unspecified abnormalities of gait and mobility: Secondary | ICD-10-CM | POA: Diagnosis not present

## 2018-05-25 DIAGNOSIS — F0281 Dementia in other diseases classified elsewhere with behavioral disturbance: Secondary | ICD-10-CM | POA: Diagnosis not present

## 2018-05-25 DIAGNOSIS — N182 Chronic kidney disease, stage 2 (mild): Secondary | ICD-10-CM | POA: Diagnosis not present

## 2018-05-30 DIAGNOSIS — F419 Anxiety disorder, unspecified: Secondary | ICD-10-CM | POA: Diagnosis not present

## 2018-05-30 DIAGNOSIS — F0630 Mood disorder due to known physiological condition, unspecified: Secondary | ICD-10-CM | POA: Diagnosis not present

## 2018-05-30 DIAGNOSIS — F0391 Unspecified dementia with behavioral disturbance: Secondary | ICD-10-CM | POA: Diagnosis not present

## 2018-06-13 DIAGNOSIS — E038 Other specified hypothyroidism: Secondary | ICD-10-CM | POA: Diagnosis not present

## 2018-06-13 DIAGNOSIS — E1165 Type 2 diabetes mellitus with hyperglycemia: Secondary | ICD-10-CM | POA: Diagnosis not present

## 2018-06-13 DIAGNOSIS — E119 Type 2 diabetes mellitus without complications: Secondary | ICD-10-CM | POA: Diagnosis not present

## 2018-06-13 DIAGNOSIS — I10 Essential (primary) hypertension: Secondary | ICD-10-CM | POA: Diagnosis not present

## 2019-02-16 DEATH — deceased
